# Patient Record
Sex: Female | Born: 1995
Health system: Southern US, Community
[De-identification: ages and names within clinical notes are randomized; demographics above are authoritative.]

## PROBLEM LIST (undated history)

## (undated) DIAGNOSIS — G43909 Migraine, unspecified, not intractable, without status migrainosus: Secondary | ICD-10-CM

## (undated) DIAGNOSIS — G510 Bell's palsy: Secondary | ICD-10-CM

## (undated) DIAGNOSIS — E119 Type 2 diabetes mellitus without complications: Secondary | ICD-10-CM

## (undated) DIAGNOSIS — I10 Essential (primary) hypertension: Secondary | ICD-10-CM

## (undated) HISTORY — DX: Bell's palsy: G51.0

## (undated) HISTORY — DX: Type 2 diabetes mellitus without complications: E11.9

## (undated) HISTORY — PX: WISDOM TOOTH EXTRACTION: SHX21

## (undated) HISTORY — DX: Essential (primary) hypertension: I10

---
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
## 2014-01-28 ENCOUNTER — Encounter (HOSPITAL_COMMUNITY): Payer: Self-pay | Admitting: Emergency Medicine

## 2014-01-28 ENCOUNTER — Emergency Department (HOSPITAL_COMMUNITY)
Admission: EM | Admit: 2014-01-28 | Discharge: 2014-01-28 | Disposition: A | Discharge status: Home / Self Care | Payer: BC Managed Care – PPO | Attending: Emergency Medicine | Admitting: Emergency Medicine

## 2014-01-28 DIAGNOSIS — G4459 Other complicated headache syndrome: Secondary | ICD-10-CM

## 2014-01-28 DIAGNOSIS — G510 Bell's palsy: Secondary | ICD-10-CM | POA: Insufficient documentation

## 2014-01-28 DIAGNOSIS — Z8679 Personal history of other diseases of the circulatory system: Secondary | ICD-10-CM | POA: Insufficient documentation

## 2014-01-28 DIAGNOSIS — Z793 Long term (current) use of hormonal contraceptives: Secondary | ICD-10-CM | POA: Diagnosis not present

## 2014-01-28 DIAGNOSIS — Z87891 Personal history of nicotine dependence: Secondary | ICD-10-CM | POA: Insufficient documentation

## 2014-01-28 DIAGNOSIS — R51 Headache: Secondary | ICD-10-CM | POA: Diagnosis present

## 2014-01-28 MED ORDER — PREDNISONE 20 MG PO TABS: 60.0000 mg | ORAL_TABLET | Freq: Every day | ORAL | Status: DC

## 2014-01-28 MED ORDER — DEXAMETHASONE SODIUM PHOSPHATE 10 MG/ML IJ SOLN
10.0000 mg | Freq: Once | INTRAMUSCULAR | Status: AC
  Administered 2014-01-28: 10 mg via INTRAVENOUS
  Filled 2014-01-28: qty 1

## 2014-01-28 MED ORDER — SODIUM CHLORIDE 0.9 % IV BOLUS (SEPSIS)
1000.0000 mL | Freq: Once | INTRAVENOUS | Status: AC
  Administered 2014-01-28: 1000 mL via INTRAVENOUS

## 2014-01-28 MED ORDER — KETOROLAC TROMETHAMINE 30 MG/ML IJ SOLN
60.0000 mg | Freq: Once | INTRAMUSCULAR | Status: DC
  Filled 2014-01-28: qty 2

## 2014-01-28 MED ORDER — KETOROLAC TROMETHAMINE 30 MG/ML IJ SOLN
30.0000 mg | Freq: Once | INTRAMUSCULAR | Status: AC
  Administered 2014-01-28: 30 mg via INTRAVENOUS

## 2014-01-28 MED ORDER — METOCLOPRAMIDE HCL 5 MG/ML IJ SOLN
10.0000 mg | Freq: Once | INTRAMUSCULAR | Status: AC
  Administered 2014-01-28: 10 mg via INTRAVENOUS
  Filled 2014-01-28: qty 2

## 2014-01-28 NOTE — ED Notes (Signed)
Pt reports right temporal headache and right facial droop. Pt reports hx of complicated migraine.

## 2014-01-28 NOTE — Discharge Instructions (Signed)
Bell's Palsy °Bell's palsy is a condition in which the muscles on one side of the face cannot move (paralysis). This is because the nerves in the face are paralyzed. It is most often thought to be caused by a virus. The virus causes swelling of the nerve that controls movement on one side of the face. The nerve travels through a tight space surrounded by bone. When the nerve swells, it can be compressed by the bone. This results in damage to the protective covering around the nerve. This damage interferes with how the nerve communicates with the muscles of the face. As a result, it can cause weakness or paralysis of the facial muscles.  °Injury (trauma), tumor, and surgery may cause Bell's palsy, but most of the time the cause is unknown. It is a relatively common condition. It starts suddenly (abrupt onset) with the paralysis usually ending within 2 days. Bell's palsy is not dangerous. But because the eye does not close properly, you may need care to keep the eye from getting dry. This can include splinting (to keep the eye shut) or moistening with artificial tears. Bell's palsy very seldom occurs on both sides of the face at the same time. °SYMPTOMS  °· Eyebrow sagging. °· Drooping of the eyelid and corner of the mouth. °· Inability to close one eye. °· Loss of taste on the front of the tongue. °· Sensitivity to loud noises. °TREATMENT  °The treatment is usually non-surgical. If the patient is seen within the first 24 to 48 hours, a short course of steroids may be prescribed, in an attempt to shorten the length of the condition. Antiviral medicines may also be used with the steroids, but it is unclear if they are helpful.  °You will need to protect your eye, if you cannot close it. The cornea (clear covering over your eye) will become dry and can be damaged. Artificial tears can be used to keep your eye moist. Glasses or an eye patch should be worn to protect your eye. °PROGNOSIS  °Recovery is variable, ranging  from days to months. Although the problem usually goes away completely (about 80% of cases resolve), predicting the outcome is impossible. Most people improve within 3 weeks of when the symptoms began. Improvement may continue for 3 to 6 months. A small number of people have moderate to severe weakness that is permanent.  °HOME CARE INSTRUCTIONS  °· If your caregiver prescribed medication to reduce swelling in the nerve, use as directed. Do not stop taking the medication unless directed by your caregiver. °· Use moisturizing eye drops as needed to prevent drying of your eye, as directed by your caregiver. °· Protect your eye, as directed by your caregiver. °· Use facial massage and exercises, as directed by your caregiver. °· Perform your normal activities, and get your normal rest. °SEEK IMMEDIATE MEDICAL CARE IF:  °· There is pain, redness or irritation in the eye. °· You or your child has an oral temperature above 102° F (38.9° C), not controlled by medicine. °MAKE SURE YOU:  °· Understand these instructions. °· Will watch your condition. °· Will get help right away if you are not doing well or get worse. °Document Released: 12/28/2004 Document Revised: 03/22/2011 Document Reviewed: 04/06/2013 °[employer]® Patient Information ©2015 [employer]. This information is not intended to replace advice given to you by your health care provider. Make sure you discuss any questions you have with your health care provider. ° °

## 2014-01-28 NOTE — ED Notes (Signed)
Pt states that around 2am this morning she started having the facial numbness.  Pt has facial droop to right side as well as right eye not closing symmetrically to left. Pt denies any weakness or deficits in hands or feet. Pt states that she has had this happen before but on her left side and was seen at hospital for it in 2014.

## 2014-01-28 NOTE — ED Provider Notes (Signed)
CSN: [id number]     Arrival date & time 01/28/14  1254 History   First MD Initiated Contact with Patient 01/28/14 1337     Chief Complaint  Patient presents with  . Facial Droop  . facial numbness   . Headache     (Consider location/radiation/quality/duration/timing/severity/associated sxs/prior Treatment) HPI Pt is a 19yo femlae with hx of complicated migraines presenting to ED with c/o right sided headache, associated with right sided facial numbness and droop.  Headache started about 2 days ago, waxing and waning, improves with ibuprofen.  Pain is 8/10 at this time.  Right sided facial numbness and droop started about 2AM this morning.  Denies numbness or weakness in arms or legs. No neck, back or chest pain. No n/v/d. No fever or chills. Pt reports hx of similar symptoms with headache in 2014.  Pt had entire left sided weakness at that time. She was advised to f/u with neurology but states due to symptoms resolving, she never followed up as recommended. Denies recent injury, illness or travel. No new medications.   History reviewed. No pertinent past medical history. Past Surgical History  Procedure Laterality Date  . Wisdom tooth extraction     No family history on file. History  Substance Use Topics  . Smoking status: Former Smoker    Quit date: 01/11/2014  . Smokeless tobacco: Not on file  . Alcohol Use: Yes     Comment: moderate   OB History    No data available     Review of Systems  HENT: Negative for congestion and sinus pressure.   Eyes: Positive for photophobia. Negative for pain, redness and visual disturbance.  Cardiovascular: Negative for chest pain and palpitations.  Gastrointestinal: Negative for nausea, vomiting, abdominal pain and diarrhea.  Musculoskeletal: Negative for myalgias, back pain, neck pain and neck stiffness.  Neurological: Positive for facial asymmetry (right sided facial droop) and headaches ( right sided). Negative for dizziness, tremors,  seizures, syncope, speech difficulty, weakness, light-headedness and numbness.  All other systems reviewed and are negative.     Allergies  Pollen extract  Home Medications   Prior to Admission medications   Medication Sig Start Date End Date Taking? Authorizing Provider  ibuprofen (ADVIL,MOTRIN) 200 MG tablet Take 1,200 mg by mouth every 6 (six) hours as needed for headache or moderate pain.   Yes Historical Provider, MD  levonorgestrel-ethinyl estradiol (AVIANE,ALESSE,LESSINA) 0.1-20 MG-MCG tablet Take 1 tablet by mouth daily.   Yes Historical Provider, MD  ibuprofen (ADVIL,MOTRIN) 600 MG tablet Take 1 tablet (600 mg total) by mouth every 6 (six) hours as needed for pain. Patient not taking: Reported on 01/28/2014 10/22/12   Derwood KaplanAnkit Nanavati, MD  predniSONE (DELTASONE) 20 MG tablet Take 3 tablets (60 mg total) by mouth daily with breakfast. 3 tabs daily for 5 days, 2 tabs daily for 3 days, 1 tab daily for 2 days (total 10 days) 01/28/14   Junius FinnerErin O'Malley, PA-C   BP 145/95 mmHg  Pulse 65  Temp(Src) 98.5 F (36.9 C) (Oral)  Resp 18  Ht 5\' 6"  (1.676 m)  Wt 235 lb (106.595 kg)  BMI 37.95 kg/m2  SpO2 97%  LMP 01/22/2014 Physical Exam  Constitutional: She is oriented to person, place, and time. She appears well-developed and well-nourished. No distress.  Pt sitting calmly on exam bed, NAD  HENT:  Head: Normocephalic and atraumatic.  Eyes: Conjunctivae and EOM are normal. Pupils are equal, round, and reactive to light. No scleral icterus.  Neck: Normal range  of motion. Neck supple.  No nuchal rigidity or meningeal signs.  Cardiovascular: Normal rate, regular rhythm and normal heart sounds.   Pulmonary/Chest: Effort normal and breath sounds normal. No respiratory distress. She has no wheezes. She has no rales. She exhibits no tenderness.  Abdominal: Soft. Bowel sounds are normal. She exhibits no distension and no mass. There is no tenderness. There is no rebound and no guarding.   Musculoskeletal: Normal range of motion.  Neurological: She is alert and oriented to person, place, and time. She has normal strength. A cranial nerve deficit is present. No sensory deficit. She displays a negative Romberg sign. Coordination and gait normal.  Right sided facial droop involving entire right side of face including eyebrow, cheek and lips.  Unable to raise right eyebrow. Asymmetric smile. Speech is fluent. FROM with 5/5 strength in UE & LE bilaterally. Normal gait.   Skin: Skin is warm and dry. She is not diaphoretic.  Nursing note and vitals reviewed.   ED Course  Procedures (including critical care time) Labs Review Labs Reviewed - No data to display  Imaging Review No results found.   EKG Interpretation None      MDM   Final diagnoses:  Complicated headache syndromes  Bell's palsy   Pt with hx of complicated headaches presenting to ED with right sided facial droop with headache. HA started 2 days ago, facial droop started this morning. No hx trauma. No extremity weakness. Pt appears well, non-toxic. Facial weakness c/w Bell's Palsy. Not concerned for [facility], CVA, or meningitis.   Previous medical records reviewed. Do not believe repeat imaging would be of benefit at this time. Migraine cocktail with IV fluids, decadron, reglan and toradol given. Pt states she feels much improved.  Pain improved from 8/10 to 3/10.  Mild improvement in right sided facial droop. Pt hemodynamically stable for discharge home. Advised to f/u with [facility]. Rx: prednisone taper for 10 days. Home care instructions provided. Pt verbalized understanding and agreement with tx plan.    Junius Finner, PA-C 01/28/14 1519  Raeford Razor, MD 01/28/14 [phone]

## 2014-02-07 ENCOUNTER — Encounter: Payer: Self-pay | Admitting: Neurology

## 2014-02-07 ENCOUNTER — Ambulatory Visit (INDEPENDENT_AMBULATORY_CARE_PROVIDER_SITE_OTHER): Payer: BC Managed Care – PPO | Admitting: Neurology

## 2014-02-07 VITALS — BP 138/88 | HR 78 | Ht 66.0 in | Wt 233.0 lb

## 2014-02-07 DIAGNOSIS — B029 Zoster without complications: Secondary | ICD-10-CM

## 2014-02-07 DIAGNOSIS — G510 Bell's palsy: Secondary | ICD-10-CM

## 2014-02-07 MED ORDER — ACYCLOVIR 400 MG PO TABS: 400.0000 mg | ORAL_TABLET | Freq: Every day | ORAL | Status: DC

## 2014-02-07 MED ORDER — PREDNISONE 10 MG PO TABS: ORAL_TABLET | ORAL | Status: DC

## 2014-02-07 MED ORDER — ARTIFICIAL TEARS OP OINT: TOPICAL_OINTMENT | Freq: Every evening | OPHTHALMIC | Status: DC | PRN

## 2014-02-07 NOTE — Progress Notes (Signed)
PATIENT: Stacy Wang DOB: 1995/04/08  HISTORICAL  Stacy Wang is a 19 years old right-handed African-American female, accompanied by her father, referred by emergency room for evaluation of right Bell's palsy  She had a history of migraine headaches, she suffered prolonged severe headaches in January 25 2014, at evening of January 28 2014, she noticed sharp pain behind her right ear, which is different from her regular migraine, she also noticed difficulty moving her right mouth, difficulty closing her right eye, she could not taste her food at the right side of her tongue, woke up with worsening symptoms next morning,  Presented to the emergency room, was diagnosed with right Bell's palsy, was given the prescription of prednisone, tapering for 10 days,   There is mild improvement of her right facial weakness, continue have intermittent sharp pain at right ear, also noticed rashes, vesicle broken out at her right ear, no hearing loss, no visual loss, she does has a history of chickenpox as a child.   REVIEW OF SYSTEMS: Full 14 system review of systems performed and notable only for fever, chill, chest pain, hearing loss, ringing ears, blurry vision, eye pain, snoring, increased thirst, achy muscles, skin sensitivity, passing out, headaches, numbness, depression, insomnia, sleepiness,  ALLERGIES: Allergies  Allergen Reactions  . Pollen Extract Itching    HOME MEDICATIONS: Current Outpatient Prescriptions  Medication Sig Dispense Refill  . levonorgestrel-ethinyl estradiol (AVIANE,ALESSE,LESSINA) 0.1-20 MG-MCG tablet Take 1 tablet by mouth daily.     No current facility-administered medications for this visit.    PAST MEDICAL HISTORY: Past Medical History  Diagnosis Date  . Hypertension     PAST SURGICAL HISTORY: Past Surgical History  Procedure Laterality Date  . Wisdom tooth extraction      FAMILY HISTORY: Family History  Problem Relation Age of Onset  .  Depression Mother   . Diabetes Father   . Heart disease Father     SOCIAL HISTORY:  History   Social History  . Marital Status: Single    Spouse Name: N/A    Number of Children: 0  . Years of Education: Some colle   Occupational History  . [employer] - customer service    Social History Main Topics  . Smoking status: Former Smoker    Quit date: 01/11/2014  . Smokeless tobacco: Not on file  . Alcohol Use: 0.0 oz/week    0 Not specified per week     Comment: moderate  . Drug Use: No  . Sexual Activity: Not on file   Other Topics Concern  . Not on file   Social History Narrative   Right handed.   Lives at home with roommates.   Caffeine occasionally - not daily.     PHYSICAL EXAM   Filed Vitals:   02/07/14 1628  BP: 138/88  Pulse: 78  Height: 5\' 6"  (1.676 m)  Weight: 233 lb (105.688 kg)    Not recorded      Body mass index is 37.63 kg/(m^2).   Generalized: In no acute distress  Neck: Supple, no carotid bruits   Cardiac: Regular rate rhythm  Pulmonary: Clear to auscultation bilaterally  Musculoskeletal: No deformity  Neurological examination  Mentation: Alert oriented to time, place, history taking, and causual conversation,  Cranial nerve II-XII: Pupils were equal round reactive to light. Extraocular movements were full.  Visual field were full on confrontational test. Bilateral fundi were sharp.  Facial sensation were normal.  she has right upper, lower face weakness, was not  able to close her right eye tightly, has no right frontalis movement, profound right lower face weakness Hearing was intact to finger rubbing bilaterally. Uvula tongue midline.  Head turning and shoulder shrug and were normal and symmetric.Tongue protrusion into cheek strength was normal.External auditory canal was clear, she has red vesicles at her right ear   Motor: Normal tone, bulk and strength.  Sensory: Intact to fine touch, pinprick, preserved vibratory sensation, and  proprioception at toes.  Coordination: Normal finger to nose, heel-to-shin bilaterally there was no truncal ataxia  Gait: Rising up from seated position without assistance, normal stance, without trunk ataxia, moderate stride, good arm swing, smooth turning, able to perform tiptoe, and heel walking without difficulty.   Romberg signs: Negative  Deep tendon reflexes: Brachioradialis 2/2, biceps 2/2, triceps 2/2, patellar 2/2, Achilles 2/2, plantar responses were flexor bilaterally.   DIAGNOSTIC DATA (LABS, IMAGING, TESTING) - I reviewed patient records, labs, notes, testing and imaging myself where available.  Lab Results  Component Value Date   WBC 9.7 10/22/2012   HGB 11.3* 10/22/2012   HCT 34.0* 10/22/2012   MCV 71.3* 10/22/2012   PLT 405* 10/22/2012      Component Value Date/Time   NA 136 10/22/2012 0208   K 3.4* 10/22/2012 0208   CL 100 10/22/2012 0208   CO2 22 10/22/2012 0208   GLUCOSE 94 10/22/2012 0208   BUN 9 10/22/2012 0208   CREATININE 0.69 10/22/2012 0208   CALCIUM 9.8 10/22/2012 0208   GFRNONAA >90 10/22/2012 0208   GFRAA >90 10/22/2012 0208    ASSESSMENT AND PLAN  Ahria Schommer is a 19 y.o. female with right ear pain, vesicles broken out, consistent with right shingles, right Bell's palsy,  1, give her another course of steroids,  2, acyclovir 400 mg 5 times daily   3. Return to clinic in 2-3 weeks  Orders Placed This Encounter  Procedures  . CBC With Differential  . Comprehensive metabolic panel    Return in about 2 weeks (around 02/21/2014). Levert Feinstein, M.D. Ph.D.  [facility] [address] [place], [place] [address] [phone]

## 2014-02-08 ENCOUNTER — Telehealth: Payer: Self-pay | Admitting: Neurology

## 2014-02-08 LAB — COMPREHENSIVE METABOLIC PANEL
A/G RATIO: 1.9 (ref 1.1–2.5)
ALT: 12 IU/L (ref 0–32)
AST: 10 IU/L (ref 0–40)
Albumin: 4.3 g/dL (ref 3.5–5.5)
Alkaline Phosphatase: 73 IU/L (ref 39–117)
BUN/Creatinine Ratio: 13 (ref 8–20)
BUN: 9 mg/dL (ref 6–20)
CO2: 25 mmol/L (ref 18–29)
CREATININE: 0.71 mg/dL (ref 0.57–1.00)
Calcium: 9.6 mg/dL (ref 8.7–10.2)
Chloride: 98 mmol/L (ref 97–108)
GFR calc Af Amer: 142 mL/min/{1.73_m2} (ref >59)
GFR, EST NON AFRICAN AMERICAN: 123 mL/min/{1.73_m2} (ref >59)
Globulin, Total: 2.3 g/dL (ref 1.5–4.5)
Glucose: 100 mg/dL — ABNORMAL HIGH (ref 65–99)
POTASSIUM: 4 mmol/L (ref 3.5–5.2)
Sodium: 138 mmol/L (ref 134–144)
Total Protein: 6.6 g/dL (ref 6.0–8.5)

## 2014-02-08 LAB — CBC WITH DIFFERENTIAL
Basophils Absolute: 0 10*3/uL (ref 0.0–0.2)
Basos: 0 %
Eos: 0 %
Eosinophils Absolute: 0.1 10*3/uL (ref 0.0–0.4)
HCT: 37.3 % (ref 34.0–46.6)
Hemoglobin: 12.3 g/dL (ref 11.1–15.9)
IMMATURE GRANULOCYTES: 0 %
Immature Grans (Abs): 0 10*3/uL (ref 0.0–0.1)
LYMPHS ABS: 6.5 10*3/uL — AB (ref 0.7–3.1)
LYMPHS: 40 %
MCH: 26.6 pg (ref 26.6–33.0)
MCHC: 33 g/dL (ref 31.5–35.7)
MCV: 81 fL (ref 79–97)
MONOS ABS: 1.4 10*3/uL — AB (ref 0.1–0.9)
Monocytes: 9 %
NEUTROS PCT: 51 %
Neutrophils Absolute: 8.3 10*3/uL — ABNORMAL HIGH (ref 1.4–7.0)
RBC: 4.62 x10E6/uL (ref 3.77–5.28)
RDW: 14.8 % (ref 12.3–15.4)
WBC: 16.3 10*3/uL — ABNORMAL HIGH (ref 3.4–10.8)

## 2014-02-08 NOTE — Telephone Encounter (Signed)
Michelle: Please call patient, laboratory showed elevated WBCs, could due to her most recent steroid use, check to see if she has any signs of infection, such as coughing, fever, UTI symptoms. If not, continue medication as prescribed

## 2014-02-08 NOTE — Telephone Encounter (Signed)
Left message for patient to return my call.

## 2014-02-11 NOTE — Telephone Encounter (Signed)
Patient aware of results.  She has not had any symptoms suggesting an infection.  She will continue her medications as prescribed.

## 2014-02-11 NOTE — Telephone Encounter (Signed)
Left message on voicemail.

## 2014-02-21 ENCOUNTER — Encounter: Payer: Self-pay | Admitting: Neurology

## 2014-02-21 ENCOUNTER — Ambulatory Visit (INDEPENDENT_AMBULATORY_CARE_PROVIDER_SITE_OTHER): Payer: BC Managed Care – PPO | Admitting: Neurology

## 2014-02-21 VITALS — BP 149/100 | HR 98 | Ht 66.0 in | Wt 232.0 lb

## 2014-02-21 DIAGNOSIS — G510 Bell's palsy: Secondary | ICD-10-CM

## 2014-02-21 DIAGNOSIS — B029 Zoster without complications: Secondary | ICD-10-CM

## 2014-02-21 MED ORDER — GABAPENTIN 300 MG PO CAPS: 300.0000 mg | ORAL_CAPSULE | Freq: Three times a day (TID) | ORAL | Status: DC

## 2014-02-21 NOTE — Progress Notes (Signed)
PATIENT: Stacy Wang DOB: 09-24-1995  HISTORICAL (initial visit Jan 28th 2016)  Stacy Wang is a 19 years old right-handed African-American female, accompanied by her father, referred by emergency room for evaluation of right Bell's palsy  She had a history of migraine headaches, she suffered prolonged severe headaches in January 25 2014, at evening of January 28 2014, she noticed sharp pain behind her right ear, which is different from her regular migraine, she also noticed difficulty moving her right mouth, difficulty closing her right eye, she could not taste her food at the right side of her tongue, woke up with worsening symptoms next morning,  Presented to the emergency room, was diagnosed with right Bell's palsy, was given the prescription of prednisone, tapering for 10 days,   There is mild improvement of her right facial weakness, continue have intermittent sharp pain at right ear, also noticed rashes, vesicle broken out at her right ear, no hearing loss, no visual loss, she does has a history of chickenpox as a child.  UPDATE FEb 11th 2016: She has mild right cheek pain when pressed on it, she has no significant dry eye, she can close her right eye now, but still has significant right facial weakness, right ear rash has gone.    REVIEW OF SYSTEMS: Full 14 system review of systems performed and notable only for   ALLERGIES: Allergies  Allergen Reactions  . Pollen Extract Itching    HOME MEDICATIONS: Current Outpatient Prescriptions  Medication Sig Dispense Refill  . artificial tears (LACRILUBE) OINT ophthalmic ointment Place into the right eye at bedtime as needed for dry eyes. 1 Tube 3  . levonorgestrel-ethinyl estradiol (AVIANE,ALESSE,LESSINA) 0.1-20 MG-MCG tablet Take 1 tablet by mouth daily.     No current facility-administered medications for this visit.    PAST MEDICAL HISTORY: Past Medical History  Diagnosis Date  . Hypertension     PAST SURGICAL  HISTORY: Past Surgical History  Procedure Laterality Date  . Wisdom tooth extraction      FAMILY HISTORY: Family History  Problem Relation Age of Onset  . Depression Mother   . Diabetes Father   . Heart disease Father     SOCIAL HISTORY:  History   Social History  . Marital Status: Single    Spouse Name: N/A  . Number of Children: 0  . Years of Education: Some colle   Occupational History  . [employer] - customer service    Social History Main Topics  . Smoking status: Former Smoker    Quit date: 01/11/2014  . Smokeless tobacco: Not on file  . Alcohol Use: 0.0 oz/week    0 Standard drinks or equivalent per week     Comment: moderate  . Drug Use: No  . Sexual Activity: Not on file   Other Topics Concern  . Not on file   Social History Narrative   Right handed.   Lives at home with roommates.   Caffeine occasionally - not daily.     PHYSICAL EXAM   Filed Vitals:   02/21/14 1517  BP: 149/100  Pulse: 98  Height:  (1.676 m)  Weight: 232 lb (105.235 kg)    Not recorded      Body mass index is 37.46 kg/(m^2).   Generalized: In no acute distress  Neck: Supple, no carotid bruits   Cardiac: Regular rate rhythm  Pulmonary: Clear to auscultation bilaterally  Musculoskeletal: No deformity  Neurological examination  Mentation: Alert oriented to time, place, history taking,  and causual conversation,  Cranial nerve II-XII: Pupils were equal round reactive to light. Extraocular movements were full.  Visual field were full on confrontational test. Bilateral fundi were sharp.  Facial sensation were normal.  she has right upper, lower face weakness, is able to close her right eye, no cornea was exposed, has no right frontalis movement, profound right lower face weakness Hearing was intact to finger rubbing bilaterally. Uvula tongue midline.  Head turning and shoulder shrug and were normal and symmetric.Tongue protrusion into cheek strength was  normal.External auditory canal was clear  Motor: Normal tone, bulk and strength.  Sensory: Intact to fine touch, pinprick, preserved vibratory sensation, and proprioception at toes.  Coordination: Normal finger to nose, heel-to-shin bilaterally there was no truncal ataxia  Gait: Rising up from seated position without assistance, normal stance, without trunk ataxia, moderate stride, good arm swing, smooth turning, able to perform tiptoe, and heel walking without difficulty.   Romberg signs: Negative  Deep tendon reflexes: Brachioradialis 2/2, biceps 2/2, triceps 2/2, patellar 2/2, Achilles 2/2, plantar responses were flexor bilaterally.   DIAGNOSTIC DATA (LABS, IMAGING, TESTING) - I reviewed patient records, labs, notes, testing and imaging myself where available.  Lab Results  Component Value Date   WBC 16.3* 02/07/2014   HGB 12.3 02/07/2014   HCT 37.3 02/07/2014   MCV 81 02/07/2014   PLT 405* 10/22/2012      Component Value Date/Time   NA 138 02/07/2014 1658   NA 136 10/22/2012 0208   K 4.0 02/07/2014 1658   CL 98 02/07/2014 1658   CO2 25 02/07/2014 1658   GLUCOSE 100* 02/07/2014 1658   GLUCOSE 94 10/22/2012 0208   BUN 9 02/07/2014 1658   BUN 9 10/22/2012 0208   CREATININE 0.71 02/07/2014 1658   CALCIUM 9.6 02/07/2014 1658   PROT 6.6 02/07/2014 1658   AST 10 02/07/2014 1658   ALT 12 02/07/2014 1658   ALKPHOS 73 02/07/2014 1658   BILITOT <0.2 02/07/2014 1658   GFRNONAA 123 02/07/2014 1658   GFRAA 142 02/07/2014 1658    ASSESSMENT AND PLAN  Stacy Wang is a 19 y.o. female with right ear pain, vesicles broken out, consistent with right shingles, right Bell's palsy, since January 28 2014, was treated with 2 rounds of tapering dose of steroid, acyclovir, continue have significant right facial weakness, also complains of frequent right-sided headaches.  1, gabapentin 300 mg 3 times a day as headache prevention 2. NSAIDs when necessary 3. Return to clinic in 3  months  Stacy Wang, M.D. Ph.D.  [facility] [address], [address] [place], [place] [address] [phone]

## 2014-03-14 ENCOUNTER — Ambulatory Visit: Payer: BC Managed Care – PPO | Admitting: Neurology

## 2014-05-23 ENCOUNTER — Ambulatory Visit: Payer: BC Managed Care – PPO | Admitting: Neurology

## 2014-06-17 ENCOUNTER — Ambulatory Visit (INDEPENDENT_AMBULATORY_CARE_PROVIDER_SITE_OTHER): Payer: BC Managed Care – PPO | Admitting: Neurology

## 2014-06-17 ENCOUNTER — Encounter: Payer: Self-pay | Admitting: Neurology

## 2014-06-17 VITALS — BP 148/102 | HR 70 | Ht 66.0 in | Wt 238.0 lb

## 2014-06-17 DIAGNOSIS — B029 Zoster without complications: Secondary | ICD-10-CM | POA: Diagnosis not present

## 2014-06-17 DIAGNOSIS — G510 Bell's palsy: Secondary | ICD-10-CM | POA: Diagnosis not present

## 2014-06-17 MED ORDER — GABAPENTIN 300 MG PO CAPS
300.0000 mg | ORAL_CAPSULE | Freq: Three times a day (TID) | ORAL | Status: DC
Start: 2014-06-17 — End: 2014-12-19

## 2014-06-17 NOTE — Progress Notes (Signed)
Chief Complaint  Patient presents with  . Bell's Palsy    Says she started to experience facial swelling,drooping and pain again approximately four weeks ago.  She has not taken gabapentin in more than two months.  . Herpes Zoster    Denies any rash but she is itching behind bilateral ears.      PATIENT: Stacy Wang DOB: [birth year]  HISTORICAL (initial visit Jan 28th 2016)  Stacy Wang is a 19 years old right-handed African-American female, accompanied by her father, referred by emergency room for evaluation of right Bell's palsy  She had a history of migraine headaches, she suffered prolonged severe headaches in January 25 2014, at evening of January 28 2014, she noticed sharp pain behind her right ear, which is different from her regular migraine, she also noticed difficulty moving her right mouth, difficulty closing her right eye, she could not taste her food at the right side of her tongue, woke up with worsening symptoms next morning,  Presented to the emergency room, was diagnosed with right Bell's palsy, was given the prescription of prednisone, tapering for 10 days,   There is mild improvement of her right facial weakness, continue have intermittent sharp pain at right ear, also noticed rashes, vesicle broken out at her right ear, no hearing loss, no visual loss, she does has a history of chickenpox as a child.  UPDATE FEb 11th 2016: She has mild right cheek pain when pressed on it, she has no significant dry eye, she can close her right eye now, but still has significant right facial weakness, right ear rash has gone.   UPDATE June 6th 2016:  She is overall better, her right facial weakness has much improved, she can close her eye better, but she noticed frequent right lower face pulling, twitching sensation, she has mild right side blurry vision,   She has frequent headaches  REVIEW OF SYSTEMS: Full 14 system review of systems performed and notable only for    ALLERGIES: Allergies  Allergen Reactions  . Pollen Extract Itching    HOME MEDICATIONS: Current Outpatient Prescriptions  Medication Sig Dispense Refill  . artificial tears (LACRILUBE) OINT ophthalmic ointment Place into the right eye at bedtime as needed for dry eyes. 1 Tube 3  . gabapentin (NEURONTIN) 300 MG capsule Take 1 capsule (300 mg total) by mouth 3 (three) times daily. 90 capsule 11  . levonorgestrel-ethinyl estradiol (AVIANE,ALESSE,LESSINA) 0.1-20 MG-MCG tablet Take 1 tablet by mouth daily.     No current facility-administered medications for this visit.    PAST MEDICAL HISTORY: Past Medical History  Diagnosis Date  . Hypertension     PAST SURGICAL HISTORY: Past Surgical History  Procedure Laterality Date  . Wisdom tooth extraction      FAMILY HISTORY: Family History  Problem Relation Age of Onset  . Depression Mother   . Diabetes Father   . Heart disease Father     SOCIAL HISTORY:  History   Social History  . Marital Status: Single    Spouse Name: N/A  . Number of Children: 0  . Years of Education: Some colle   Occupational History  . [employer] - customer service    Social History Main Topics  . Smoking status: Former Smoker    Quit date: 01/11/2014  . Smokeless tobacco: Not on file  . Alcohol Use: 0.0 oz/week    0 Standard drinks or equivalent per week     Comment: moderate  . Drug Use: No  . Sexual Activity:  Not on file   Other Topics Concern  . Not on file   Social History Narrative   Right handed.   Lives at home with roommates.   Caffeine occasionally - not daily.     PHYSICAL EXAM   Filed Vitals:   06/17/14 1324  BP: 148/102  Pulse: 70  Height:  (1.676 m)  Weight: 238 lb (107.956 kg)    Not recorded      Body mass index is 38.43 kg/(m^2).  PHYSICAL EXAMNIATION:  Gen: NAD, conversant, well nourised, obese, well groomed                     Cardiovascular: Regular rate rhythm, no peripheral edema, warm,  nontender. Eyes: Conjunctivae clear without exudates or hemorrhage Neck: Supple, no carotid bruise. Pulmonary: Clear to auscultation bilaterally   NEUROLOGICAL EXAM:  MENTAL STATUS: Speech:    Speech is normal; fluent and spontaneous with normal comprehension.  Cognition:    The patient is oriented to person, place, and time;     recent and remote memory intact;     language fluent;     normal attention, concentration,     fund of knowledge.  CRANIAL NERVES: CN II: Visual fields are full to confrontation. Fundoscopic exam is normal with sharp discs and no vascular changes. Venous pulsations are present bilaterally. Pupils are 4 mm and briskly reactive to light. Visual acuity is 20/20 bilaterally. CN III, IV, VI: extraocular movement are normal. No ptosis. CN V: Facial sensation is intact to pinprick in all 3 divisions bilaterally. Corneal responses are intact.  CN VII: She has mild right eye closure, right cheek puff weakness, she has frequent right facial twitching. CN VIII: Hearing is normal to rubbing fingers CN IX, X: Palate elevates symmetrically. Phonation is normal. CN XI: Head turning and shoulder shrug are intact CN XII: Tongue is midline with normal movements and no atrophy.  MOTOR: There is no pronator drift of out-stretched arms. Muscle bulk and tone are normal. Muscle strength is normal.  REFLEXES: Reflexes are 2+ and symmetric at the biceps, triceps, knees, and ankles. Plantar responses are flexor.  SENSORY: Light touch, pinprick, position sense, and vibration sense are intact in fingers and toes.  COORDINATION: Rapid alternating movements and fine finger movements are intact. There is no dysmetria on finger-to-nose and heel-knee-shin. There are no abnormal or extraneous movements.   GAIT/STANCE: Posture is normal. Gait is steady with normal steps, base, arm swing, and turning. Heel and toe walking are normal. Tandem gait is normal.  Romberg is  absent.   DIAGNOSTIC DATA (LABS, IMAGING, TESTING) - I reviewed patient records, labs, notes, testing and imaging myself where available.  Lab Results  Component Value Date   WBC 16.3* 02/07/2014   HGB 12.3 02/07/2014   HCT 37.3 02/07/2014   MCV 81 02/07/2014   PLT 405* 10/22/2012      Component Value Date/Time   NA 138 02/07/2014 1658   NA 136 10/22/2012 0208   K 4.0 02/07/2014 1658   CL 98 02/07/2014 1658   CO2 25 02/07/2014 1658   GLUCOSE 100* 02/07/2014 1658   GLUCOSE 94 10/22/2012 0208   BUN 9 02/07/2014 1658   BUN 9 10/22/2012 0208   CREATININE 0.71 02/07/2014 1658   CALCIUM 9.6 02/07/2014 1658   PROT 6.6 02/07/2014 1658   AST 10 02/07/2014 1658   ALT 12 02/07/2014 1658   ALKPHOS 73 02/07/2014 1658   BILITOT <0.2 02/07/2014 1658  [facility] 02/07/2014 1658   GFRAA 142 02/07/2014 1658    ASSESSMENT AND PLAN  Kiva Dareen Pianonderson is a 19 y.o. female with right Bell's palsy since January 28 2014, with painful rash broke out, consistent with right shingles, involving right seventh nerve, she was treated with 2 rounds of tapering dose of steroid, acyclovir,  Overall, her right facial weakness has much improved, occasionally right-sided headaches, right facial discomfort,  Refill gabapentin  RTC In 6 months  Levert FeinsteinYijun Covey Baller, M.D. Ph.D.  [facility] [address], [address] [place], [place] [address] [phone]

## 2014-07-04 IMAGING — CT CT HEAD W/O CM
2 series · 15 of 30 positions shown, 19 images · non-contrast
Comparison: None.

CLINICAL DATA: Left-sided numbness and left sided headache.

CT HEAD WITHOUT CONTRAST
TECHNIQUE: Contiguous axial images were obtained from the base of
the skull through the vertex without contrast.

[Series 2: head w/o · axial · non-contrast · 0.40mm/px · z∈[+283,+413]mm · 13 of 32 slices shown, 17 images]
[im 3/32  brain]
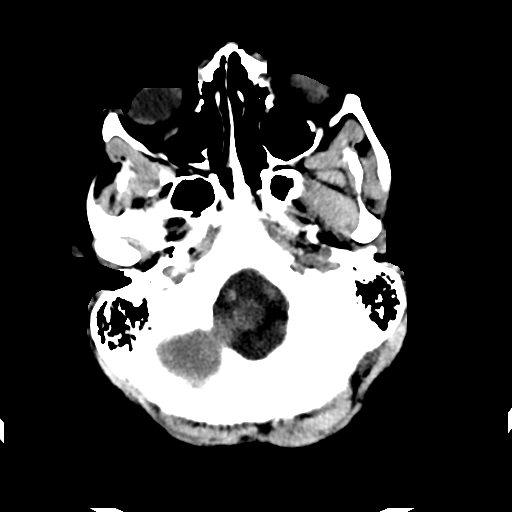
[im 3/32  bone]
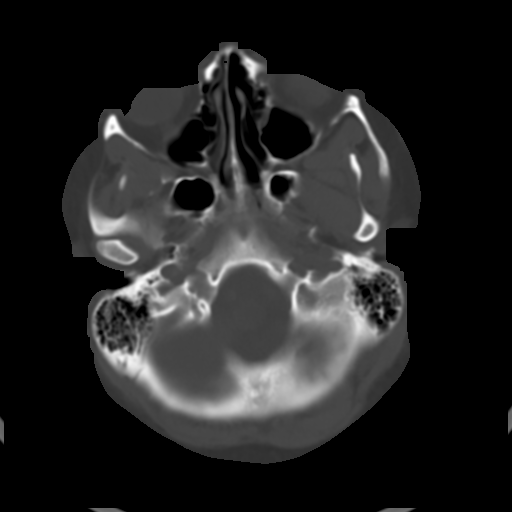
[im 5/32  brain]
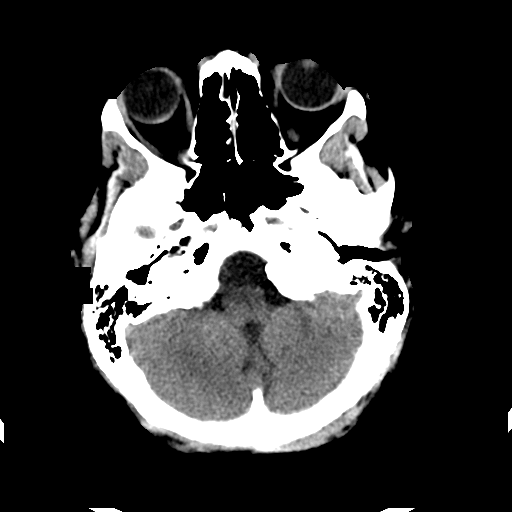
[im 7/32  brain]
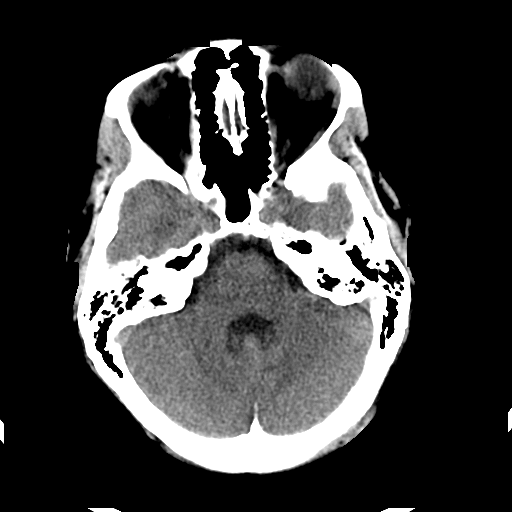
[im 9/32  brain]
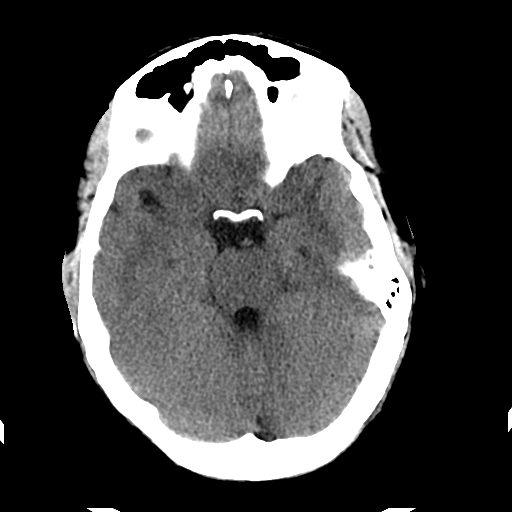
[im 12/32  brain]
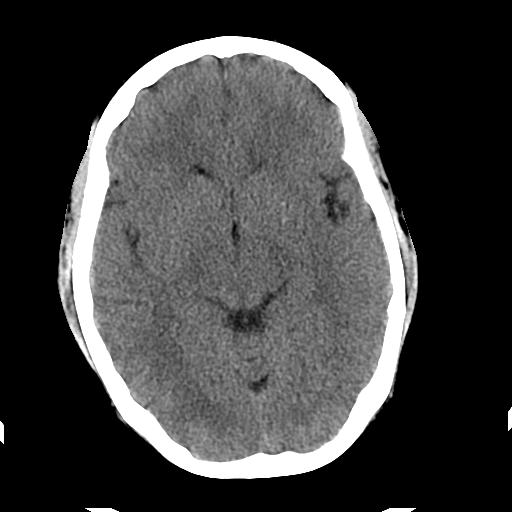
[im 12/32  bone]
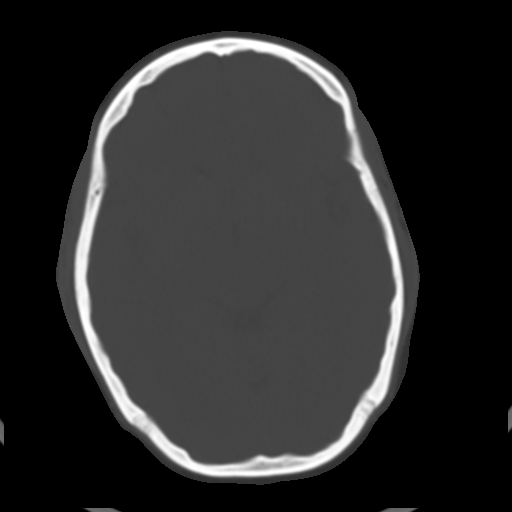
[im 14/32  brain]
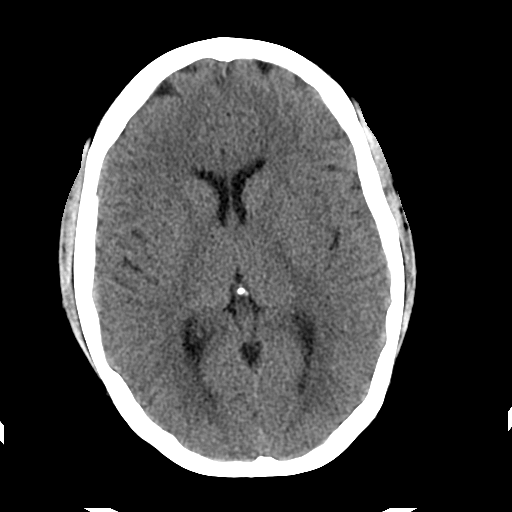
[im 16/32  brain]
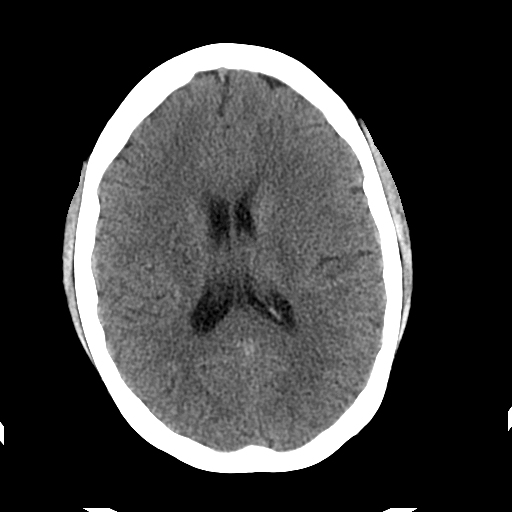
[im 18/32  brain]
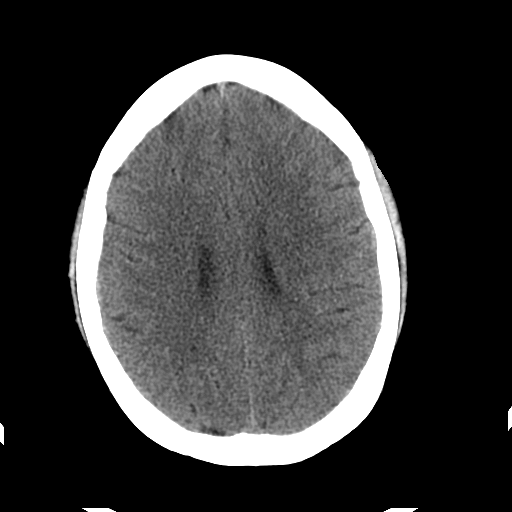
[im 20/32  brain]
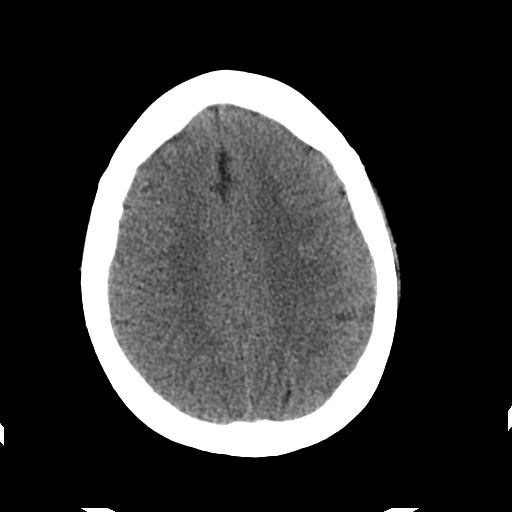
[im 20/32  bone]
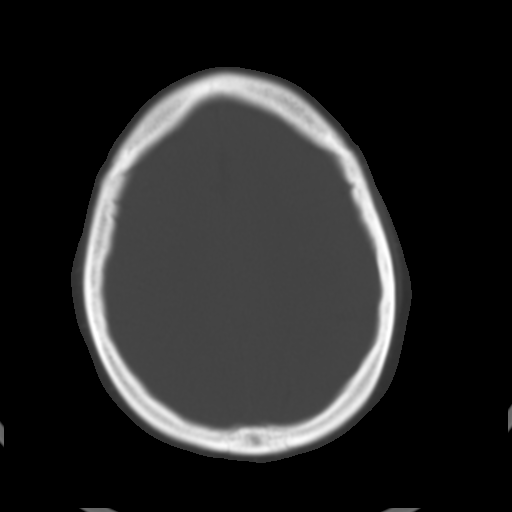
[im 23/32  brain]
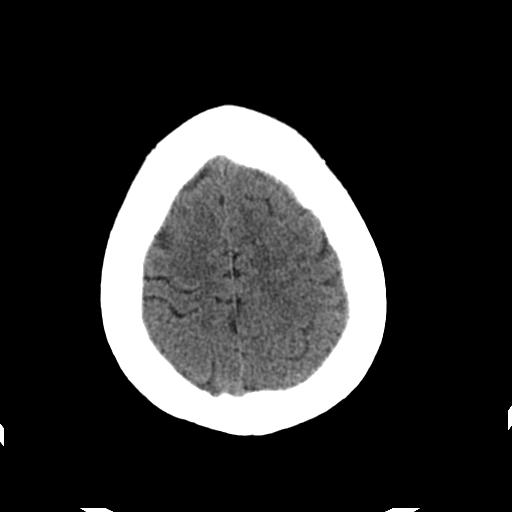
[im 25/32  brain]
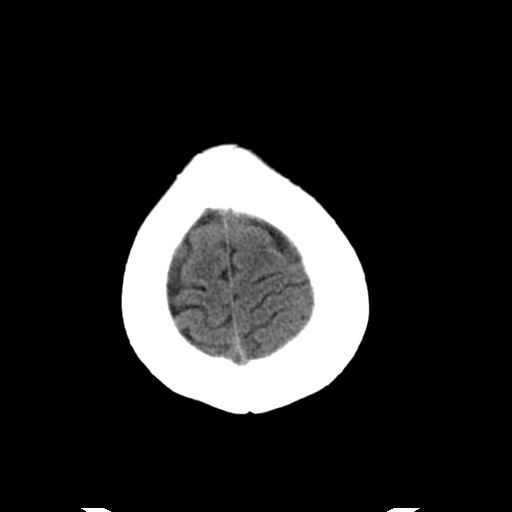
[im 27/32  brain]
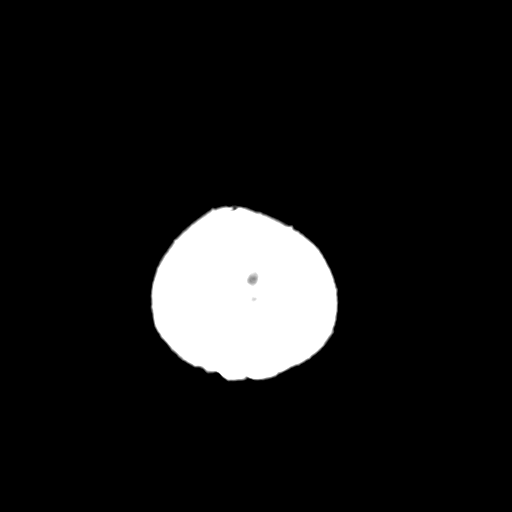
[im 29/32  brain]
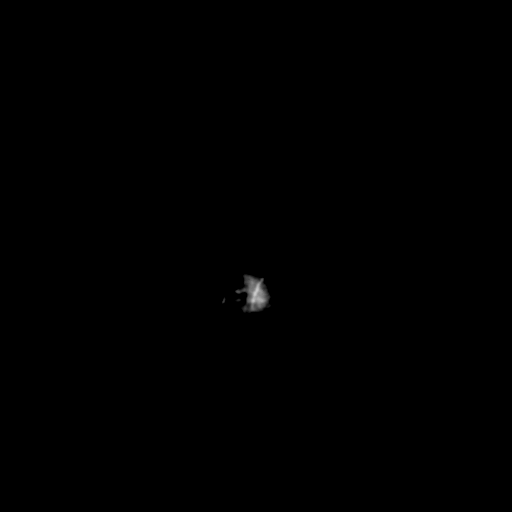
[im 29/32  bone]
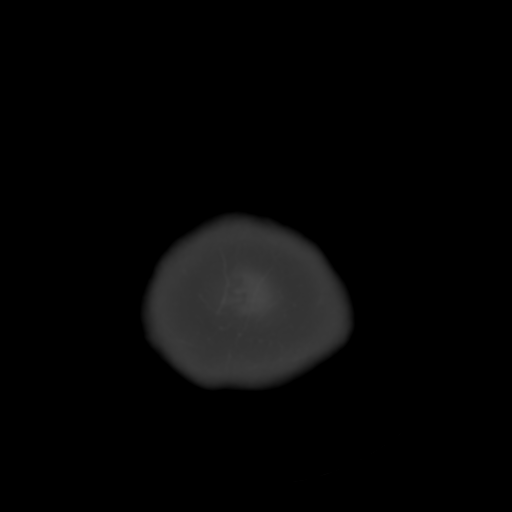

[Series 3: head w/o bone · axial · non-contrast · 0.40mm/px · z∈[+283,+303]mm · 2 of 32 slices shown]
[im 3/32  bone]
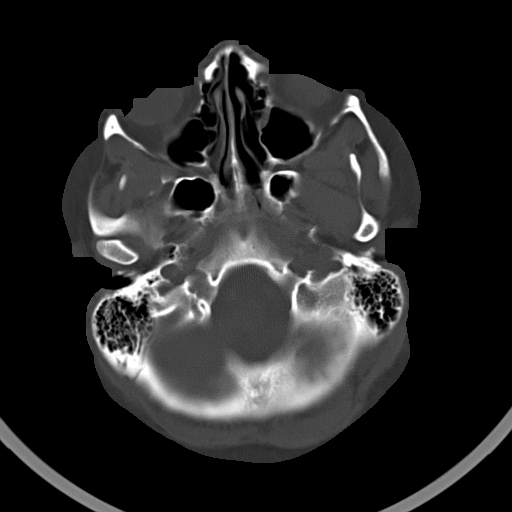
[im 7/32  bone]
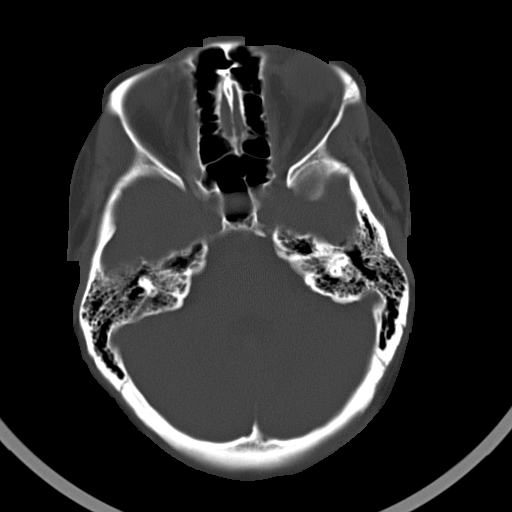

[15 of 30 positions shown; findings below may reference images not displayed]

FINDINGS: There is no evidence of acute infarction, mass lesion, or
intra- or extra-axial hemorrhage on CT.

The posterior fossa, including the cerebellum, brainstem and fourth
ventricle, is within normal limits.  The third and lateral
ventricles, and basal ganglia are unremarkable in appearance.  The
cerebral hemispheres are symmetric in appearance, with normal gray-
white differentiation.  No mass effect or midline shift is seen.

There is no evidence of fracture; visualized osseous structures are
unremarkable in appearance.  The orbits are within normal limits.
The paranasal sinuses and mastoid air cells are well-aerated.  No
significant soft tissue abnormalities are seen.
IMPRESSION: Unremarkable noncontrast CT of the head.

## 2014-12-18 ENCOUNTER — Telehealth: Payer: Self-pay

## 2014-12-18 NOTE — Telephone Encounter (Signed)
Called patient to offer earlier appt time on 12/19/14. No answer.

## 2014-12-19 ENCOUNTER — Ambulatory Visit (INDEPENDENT_AMBULATORY_CARE_PROVIDER_SITE_OTHER): Payer: BC Managed Care – PPO | Admitting: Adult Health

## 2014-12-19 ENCOUNTER — Encounter: Payer: Self-pay | Admitting: Adult Health

## 2014-12-19 ENCOUNTER — Ambulatory Visit: Payer: Self-pay | Admitting: Neurology

## 2014-12-19 ENCOUNTER — Ambulatory Visit: Payer: BC Managed Care – PPO | Admitting: Neurology

## 2014-12-19 ENCOUNTER — Telehealth: Payer: Self-pay | Admitting: Adult Health

## 2014-12-19 VITALS — BP 128/74 | HR 88 | Resp 20 | Ht 65.0 in | Wt 239.0 lb

## 2014-12-19 DIAGNOSIS — H9191 Unspecified hearing loss, right ear: Secondary | ICD-10-CM | POA: Diagnosis not present

## 2014-12-19 DIAGNOSIS — B029 Zoster without complications: Secondary | ICD-10-CM

## 2014-12-19 DIAGNOSIS — G510 Bell's palsy: Secondary | ICD-10-CM | POA: Diagnosis not present

## 2014-12-19 MED ORDER — GABAPENTIN 300 MG PO CAPS: 300.0000 mg | ORAL_CAPSULE | Freq: Three times a day (TID) | ORAL | Status: DC

## 2014-12-19 NOTE — Progress Notes (Signed)
PATIENT: Stacy Wang DOB: 10/21/1995  REASON FOR VISIT: follow up- Bell's palsy, shingles HISTORY FROM: patient  HISTORY OF PRESENT ILLNESS: Ms. Stacy Wang is a 19 year old female with a history of Bell's palsy shingles. She returns today for follow-up. The patient reports that her facial weakness has improved. She continues to have some issues with eye closure on the right. The patient reports that she is no longer taking gabapentin. She started about 2 months ago after her prescription expired. She states that she is not having any discomfort in the right side of the face. She does state that since stopping gabapentin she's been having intermittent hearing loss on the right side. She states that her hearing will decrease to a whisper and then spontaneously return to her baseline. She states that these episodes happen 2-3 times in the last 2 months. She states that she did have some changes with her initial symptoms. She states that approximately 2 months ago she did have what she thought was the flu. Since then she has not been sick. She denies any new neurological symptoms. She returns today for an evaluation.  HISTORY Stacy Arabia(YAN): Stacy Ceollyah Wong is a 19 years old right-handed African-American female, accompanied by her father, referred by emergency room for evaluation of right Bell's palsy  She had a history of migraine headaches, she suffered prolonged severe headaches in January 25 2014, at evening of January 28 2014, she noticed sharp pain behind her right ear, which is different from her regular migraine, she also noticed difficulty moving her right mouth, difficulty closing her right eye, she could not taste her food at the right side of her tongue, woke up with worsening symptoms next morning,  Presented to the emergency room, was diagnosed with right Bell's palsy, was given the prescription of prednisone, tapering for 10 days,   There is mild improvement of her right facial weakness,  continue have intermittent sharp pain at right ear, also noticed rashes, vesicle broken out at her right ear, no hearing loss, no visual loss, she does has a history of chickenpox as a child.  UPDATE FEb 11th 2016: She has mild right cheek pain when pressed on it, she has no significant dry eye, she can close her right eye now, but still has significant right facial weakness, right ear rash has gone.   UPDATE June 6th 2016:  She is overall better, her right facial weakness has much improved, she can close her eye better, but she noticed frequent right lower face pulling, twitching sensation, she has mild right side blurry vision,   She has frequent headaches  REVIEW OF SYSTEMS: Out of a complete 14 system review of symptoms, the patient complains only of the following symptoms, and all other reviewed systems are negative.  ALLERGIES: Allergies  Allergen Reactions  . Pollen Extract Itching    HOME MEDICATIONS: Outpatient Prescriptions Prior to Visit  Medication Sig Dispense Refill  . artificial tears (LACRILUBE) OINT ophthalmic ointment Place into the right eye at bedtime as needed for dry eyes. 1 Tube 3  . levonorgestrel-ethinyl estradiol (AVIANE,ALESSE,LESSINA) 0.1-20 MG-MCG tablet Take 1 tablet by mouth daily.    Marland Kitchen. gabapentin (NEURONTIN) 300 MG capsule Take 1 capsule (300 mg total) by mouth 3 (three) times daily. 90 capsule 11   No facility-administered medications prior to visit.    PAST MEDICAL HISTORY: Past Medical History  Diagnosis Date  . Hypertension   . Bell's palsy     PAST SURGICAL HISTORY: Past Surgical History  Procedure Laterality Date  . Wisdom tooth extraction      FAMILY HISTORY: Family History  Problem Relation Age of Onset  . Depression Mother   . Diabetes Father   . Heart disease Father     SOCIAL HISTORY: Social History   Social History  . Marital Status: Single    Spouse Name: N/A  . Number of Children: 0  . Years of Education: Some  colle   Occupational History  . [employer] - customer service    Social History Main Topics  . Smoking status: Former Smoker    Quit date: 01/11/2014  . Smokeless tobacco: Not on file  . Alcohol Use: 0.0 oz/week    0 Standard drinks or equivalent per week     Comment: moderate  . Drug Use: No  . Sexual Activity: Not on file   Other Topics Concern  . Not on file   Social History Narrative   Right handed.   Lives at home with roommates.   Caffeine occasionally - not daily.      PHYSICAL EXAM  Filed Vitals:   12/19/14 1454  BP: 128/74  Pulse: 88  Resp: 20  Height:  (1.651 m)  Weight: 239 lb (108.41 kg)   Body mass index is 39.77 kg/(m^2).  Generalized: Well developed, in no acute distress   Neurological examination  Mentation: Alert oriented to time, place, history taking. Follows all commands speech and language fluent Cranial nerve II-XII: Pupils were equal round reactive to light. Extraocular movements were full, visual field were full on confrontational test. Facial sensation and strength were normal. Mild right eye closure. Uvula tongue midline. Head turning and shoulder shrug  were normal and symmetric. Weakness with cheek puffing on the right. Hearing is intact to finger rubbing. HEENT: Ear canals are clear bilaterally. Tympanic membranes intact and light reflex present bilaterally. Motor: The motor testing reveals 5 over 5 strength of all 4 extremities. Good symmetric motor tone is noted throughout.  Sensory: Sensory testing is intact to soft touch on all 4 extremities. No evidence of extinction is noted.  Coordination: Cerebellar testing reveals good finger-nose-finger and heel-to-shin bilaterally.  Gait and station: Gait is normal. Tandem gait is normal. Romberg is negative. No drift is seen.  Reflexes: Deep tendon reflexes are symmetric and normal bilaterally.   DIAGNOSTIC DATA (LABS, IMAGING, TESTING) - I reviewed patient records, labs, notes, testing  and imaging myself where available.  Lab Results  Component Value Date   WBC 16.3* 02/07/2014   HGB 12.3 02/07/2014   HCT 37.3 02/07/2014   MCV 81 02/07/2014   PLT 405* 10/22/2012      Component Value Date/Time   NA 138 02/07/2014 1658   NA 136 10/22/2012 0208   K 4.0 02/07/2014 1658   CL 98 02/07/2014 1658   CO2 25 02/07/2014 1658   GLUCOSE 100* 02/07/2014 1658   GLUCOSE 94 10/22/2012 0208   BUN 9 02/07/2014 1658   BUN 9 10/22/2012 0208   CREATININE 0.71 02/07/2014 1658   CALCIUM 9.6 02/07/2014 1658   PROT 6.6 02/07/2014 1658   ALBUMIN 4.3 02/07/2014 1658   AST 10 02/07/2014 1658   ALT 12 02/07/2014 1658   ALKPHOS 73 02/07/2014 1658   BILITOT <0.2 02/07/2014 1658   GFRNONAA 123 02/07/2014 1658   GFRAA 142 02/07/2014 1658      ASSESSMENT AND PLAN 19 y.o. year old female  has a past medical history of Hypertension and Bell's palsy. here with:  1.  Bell's palsy 2. Shingles 3. Intermittent hearing loss right  The patient's facial weakness has improved. She does have intermittent hearing loss on the right side. She felt that this started after stopping gabapentin. Initially I was going to restart gabapentin however after consulting with Dr. Terrace Arabia we will hold off on restarting gabapentin and refer the patient to ENT for evaluation. Patient is amenable to this plan. She will follow-up in 3 months or sooner if needed.  Butch Penny, MSN, NP-C 12/19/2014, 3:25 PM [facility] [address] [place], [place] [address] [phone]

## 2014-12-20 NOTE — Progress Notes (Signed)
I have reviewed and agreed above plan. 

## 2014-12-23 NOTE — Telephone Encounter (Signed)
Gabapentin was ordered but after Consulting with Dr. Terrace ArabiaYan it was decided to not restart gabapentin but rather refer the patient to ENT. Alverda SkeansSandy Young RN called pharmacy to cancel refill.

## 2015-03-27 ENCOUNTER — Encounter: Payer: Self-pay | Admitting: Adult Health

## 2015-03-27 ENCOUNTER — Ambulatory Visit (INDEPENDENT_AMBULATORY_CARE_PROVIDER_SITE_OTHER): Payer: BC Managed Care – PPO | Admitting: Adult Health

## 2015-03-27 VITALS — BP 142/102 | HR 81 | Resp 22 | Ht 66.0 in | Wt 238.0 lb

## 2015-03-27 DIAGNOSIS — G510 Bell's palsy: Secondary | ICD-10-CM | POA: Diagnosis not present

## 2015-03-27 NOTE — Patient Instructions (Signed)
If your symptoms worsen or you develop new symptoms please let us know.   

## 2015-03-27 NOTE — Progress Notes (Signed)
I have reviewed and agreed above plan. 

## 2015-03-27 NOTE — Progress Notes (Signed)
PATIENT: Stacy Wang DOB: July 15, 1995  REASON FOR VISIT: follow up HISTORY FROM: patient  HISTORY OF PRESENT ILLNESS: Today 03/27/2015:  Ms. Stacy Wang is a 20 year old female with a history of Bell's palsy and shingles. She returns today for follow-up. The patient is not having any discomfort in the right side of the face. At the last visit she was complaining of some intermittent hearing loss however she followed up with ENT and was diagnosed with having fluid behind the ear. She states that the symptoms have since resolved. The patient states that she continues to have some weakness in the lower portion of the face on the right side. She states occasionally she'll have a hard time with food exiting the mouth on the right side. She has learned to accommodate this. She denies any new neurological symptoms. She returns today for an evaluation.  12/19/2014 (MM): Ms. Stacy Wang is a 20 year old female with a history of Bell's palsy shingles. She returns today for follow-up. The patient reports that her facial weakness has improved. She continues to have some issues with eye closure on the right. The patient reports that she is no longer taking gabapentin. She started about 2 months ago after her prescription expired. She states that she is not having any discomfort in the right side of the face. She does state that since stopping gabapentin she's been having intermittent hearing loss on the right side. She states that her hearing will decrease to a whisper and then spontaneously return to her baseline. She states that these episodes happen 2-3 times in the last 2 months. She states that she did have some changes with her initial symptoms. She states that approximately 2 months ago she did have what she thought was the flu. Since then she has not been sick. She denies any new neurological symptoms. She returns today for an evaluation.  HISTORY [place](YAN): Stacy Wang is a [age] years old right-handed  African-American female, accompanied by her father, referred by emergency room for evaluation of right Bell's palsy  She had a history of migraine headaches, she suffered prolonged severe headaches in January 25 2014, at evening of January 28 2014, she noticed sharp pain behind her right ear, which is different from her regular migraine, she also noticed difficulty moving her right mouth, difficulty closing her right eye, she could not taste her food at the right side of her tongue, woke up with worsening symptoms next morning,  Presented to the emergency room, was diagnosed with right Bell's palsy, was given the prescription of prednisone, tapering for 10 days,   There is mild improvement of her right facial weakness, continue have intermittent sharp pain at right ear, also noticed rashes, vesicle broken out at her right ear, no hearing loss, no visual loss, she does has a history of chickenpox as a child.  UPDATE FEb 11th 2016: She has mild right cheek pain when pressed on it, she has no significant dry eye, she can close her right eye now, but still has significant right facial weakness, right ear rash has gone.   UPDATE June 6th 2016:  She is overall better, her right facial weakness has much improved, she can close her eye better, but she noticed frequent right lower face pulling, twitching sensation, she has mild right side blurry vision,   She has frequent headaches REVIEW OF SYSTEMS: Out of a complete 14 system review of symptoms, the patient complains only of the following symptoms, and all other reviewed systems are negative.  ALLERGIES: Allergies  Allergen Reactions  . Pollen Extract Itching    HOME MEDICATIONS: Outpatient Prescriptions Prior to Visit  Medication Sig Dispense Refill  . levonorgestrel-ethinyl estradiol (AVIANE,ALESSE,LESSINA) 0.1-20 MG-MCG tablet Take 1 tablet by mouth daily.    Marland Kitchen artificial tears (LACRILUBE) OINT ophthalmic ointment Place into the right eye at  bedtime as needed for dry eyes. 1 Tube 3   No facility-administered medications prior to visit.    PAST MEDICAL HISTORY: Past Medical History  Diagnosis Date  . Hypertension   . Bell's palsy     PAST SURGICAL HISTORY: Past Surgical History  Procedure Laterality Date  . Wisdom tooth extraction      FAMILY HISTORY: Family History  Problem Relation Age of Onset  . Depression Mother   . Diabetes Father   . Heart disease Father     SOCIAL HISTORY: Social History   Social History  . Marital Status: Single    Spouse Name: N/A  . Number of Children: 0  . Years of Education: Some colle   Occupational History  . [employer] - customer service    Social History Main Topics  . Smoking status: Light Tobacco Smoker  . Smokeless tobacco: Not on file  . Alcohol Use: 0.0 oz/week    0 Standard drinks or equivalent per week     Comment: moderate  . Drug Use: No  . Sexual Activity: Not on file   Other Topics Concern  . Not on file   Social History Narrative   Right handed.   Lives at home with roommates.   Caffeine occasionally - not daily.      PHYSICAL EXAM  Filed Vitals:   03/27/15 1026  BP: 142/102  Pulse: 81  Resp: 22  Height:  (1.676 m)  Weight: 238 lb (107.956 kg)   Body mass index is 38.43 kg/(m^2).  Generalized: Well developed, in no acute distress   Neurological examination  Mentation: Alert oriented to time, place, history taking. Follows all commands speech and language fluent Cranial nerve II-XII: Pupils were equal round reactive to light. Extraocular movements were full, visual field were full on confrontational test. Facial sensation and strength were normal. Uvula tongue midline. Head turning and shoulder shrug  were normal and symmetric. puff cheek weakness on the right. Good eye closure. Motor: The motor testing reveals 5 over 5 strength of all 4 extremities. Good symmetric motor tone is noted throughout.  Sensory: Sensory testing is intact  to soft touch on all 4 extremities. No evidence of extinction is noted.  Coordination: Cerebellar testing reveals good finger-nose-finger and heel-to-shin bilaterally.  Gait and station: Gait is normal. Tandem gait is normal. Romberg is negative. No drift is seen.  Reflexes: Deep tendon reflexes are symmetric and normal bilaterally.   DIAGNOSTIC DATA (LABS, IMAGING, TESTING) - I reviewed patient records, labs, notes, testing and imaging myself where available.   ASSESSMENT AND PLAN 20 y.o. year old female  has a past medical history of Hypertension and Bell's palsy. here with:  1. Bell's palsy 2. Shingles  The patient has remained stable. She continues to have weakness in the right lower face. She is not having any new symptoms. Denies any discomfort in the face. Patient advised that she can follow up with our office on an as-needed basis.  Butch Penny, MSN, NP-C 03/27/2015, 10:54 AM [facility] [address], [address] [place], [place] [address] [phone]

## 2015-09-24 ENCOUNTER — Ambulatory Visit (HOSPITAL_COMMUNITY): Admission: EM | Admit: 2015-09-24 | Payer: BC Managed Care – PPO | Source: Home / Self Care

## 2015-09-25 ENCOUNTER — Ambulatory Visit (HOSPITAL_COMMUNITY)
Admission: EM | Admit: 2015-09-25 | Discharge: 2015-09-25 | Disposition: A | Discharge status: Home / Self Care | Payer: BC Managed Care – PPO

## 2015-09-25 ENCOUNTER — Encounter (HOSPITAL_COMMUNITY): Payer: Self-pay | Admitting: Emergency Medicine

## 2015-09-25 DIAGNOSIS — G4489 Other headache syndrome: Secondary | ICD-10-CM | POA: Diagnosis not present

## 2015-09-25 DIAGNOSIS — I10 Essential (primary) hypertension: Secondary | ICD-10-CM

## 2015-09-25 DIAGNOSIS — M542 Cervicalgia: Secondary | ICD-10-CM

## 2015-09-25 MED ORDER — CYCLOBENZAPRINE HCL 10 MG PO TABS: 10.0000 mg | ORAL_TABLET | Freq: Two times a day (BID) | ORAL | 0 refills | Status: DC | PRN

## 2015-09-25 MED ORDER — AMLODIPINE BESYLATE 5 MG PO TABS: 5.0000 mg | ORAL_TABLET | Freq: Every day | ORAL | 0 refills | Status: DC

## 2015-09-25 MED ORDER — NAPROXEN 500 MG PO TABS: 500.0000 mg | ORAL_TABLET | Freq: Two times a day (BID) | ORAL | 0 refills | Status: DC

## 2015-09-25 NOTE — ED Notes (Signed)
Stacy Wang rechecked BP manually... Notified him to document the new reading.

## 2015-09-25 NOTE — ED Provider Notes (Signed)
CSN: [id number]     Arrival date & time 09/25/15  1006 History   First MD Initiated Contact with Patient 09/25/15 1115     Chief Complaint  Patient presents with  . Hypertension  . Knee Pain   (Consider location/radiation/quality/duration/timing/severity/associated sxs/prior Treatment)  Hypertension   Knee Pain    Past Medical History:  Diagnosis Date  . Bell's palsy   . Hypertension    Past Surgical History:  Procedure Laterality Date  . WISDOM TOOTH EXTRACTION     Family History  Problem Relation Age of Onset  . Depression Mother   . Diabetes Father   . Heart disease Father    Social History  Substance Use Topics  . Smoking status: Light Tobacco Smoker  . Smokeless tobacco: Never Used  . Alcohol use 0.0 oz/week     Comment: moderate   OB History    No data available     Review of Systems  Constitutional: Negative.   HENT: Negative.   Eyes: Negative.   Respiratory: Negative.   Cardiovascular: Negative.   Gastrointestinal: Negative.   Endocrine: Negative.   Genitourinary: Negative.   Musculoskeletal: Positive for myalgias and neck stiffness.  Allergic/Immunologic: Negative.   Neurological: Negative.   Hematological: Negative.   Psychiatric/Behavioral: Negative.     Allergies  Pollen extract  Home Medications   Prior to Admission medications   Medication Sig Start Date End Date Taking? Authorizing Provider  busPIRone (BUSPAR) 5 MG tablet Take 5 mg by mouth 3 (three) times daily.   Yes Historical Provider, MD  Cariprazine HCl (VRAYLAR PO) Take by mouth.   Yes Historical Provider, MD  levonorgestrel-ethinyl estradiol (AVIANE,ALESSE,LESSINA) 0.1-20 MG-MCG tablet Take 1 tablet by mouth daily.   Yes Historical Provider, MD  amLODipine (NORVASC) 5 MG tablet Take 1 tablet (5 mg total) by mouth daily. 09/25/15   Deatra Canter, FNP  cyclobenzaprine (FLEXERIL) 10 MG tablet Take 1 tablet (10 mg total) by mouth 2 (two) times daily as needed for muscle spasms.  09/25/15   Deatra Canter, FNP  naproxen (NAPROSYN) 500 MG tablet Take 1 tablet (500 mg total) by mouth 2 (two) times daily with a meal. 09/25/15   Deatra Canter, FNP   Meds Ordered and Administered this Visit  Medications - No data to display  BP (S) (!) 192/133 (BP Location: Right Arm) Comment: reported elevated BP to nurse Nira Conn, and CMA [place] Drone  Pulse 84   Temp 98.5 F (36.9 C) (Oral)   Resp 16   SpO2 100%  No data found.   Physical Exam  Constitutional: She appears well-developed and well-nourished.  HENT:  Head: Normocephalic and atraumatic.  Right Ear: External ear normal.  Left Ear: External ear normal.  Eyes: Conjunctivae and EOM are normal. Pupils are equal, round, and reactive to light.  Neck: Normal range of motion. Neck supple.  Cardiovascular: Normal rate, regular rhythm and normal heart sounds.   Pulmonary/Chest: Effort normal and breath sounds normal.  Musculoskeletal: She exhibits tenderness.  TTP right sternocleidomastoid muscle  Nursing note and vitals reviewed.   Urgent Care Course   Clinical Course    Procedures (including critical care time)  Labs Review Labs Reviewed - No data to display  Imaging Review No results found.   Visual Acuity Review  Right Eye Distance:   Left Eye Distance:   Bilateral Distance:    Right Eye Near:   Left Eye Near:    Bilateral Near:  MDM   1. Essential hypertension   2. Cervicalgia   3. Other headache syndrome   BP repeated with stethescope and sphygnomometer.  Right 150/100 left 154/110 Amlodipine 5mg  one po qd #14 Naprosyn 500mg  one po bid x 7 days #14 Flexeril 10mg  one po bid prn #14 Follow up with PCP today.  She has PCP but no one called her back so she is going to office now to make an appointment.     Deatra CanterWilliam J Arlethia Basso, FNP 09/25/15 1205    Deatra CanterWilliam J Pheonix Clinkscale, FNP 09/25/15 [phone]

## 2015-09-25 NOTE — ED Triage Notes (Signed)
Pt is here for HTN associated w/HA... BP today is 192/133... Does not have a PCP  Denies CP, SOB, diaphoresis, weakness, blurred vision  Also c/o bilateral knee pain onset 2 weeks  Reports she has been exercising lately  A&O x4... NAD

## 2015-10-29 DIAGNOSIS — R030 Elevated blood-pressure reading, without diagnosis of hypertension: Secondary | ICD-10-CM | POA: Insufficient documentation

## 2016-08-10 ENCOUNTER — Ambulatory Visit (INDEPENDENT_AMBULATORY_CARE_PROVIDER_SITE_OTHER): Payer: BC Managed Care – PPO | Admitting: Physician Assistant

## 2016-08-10 ENCOUNTER — Encounter: Payer: Self-pay | Admitting: Physician Assistant

## 2016-08-10 VITALS — BP 136/100 | HR 89 | Temp 98.2°F | Resp 18 | Ht 67.5 in | Wt 256.0 lb

## 2016-08-10 DIAGNOSIS — J069 Acute upper respiratory infection, unspecified: Secondary | ICD-10-CM | POA: Diagnosis not present

## 2016-08-10 MED ORDER — GUAIFENESIN ER 1200 MG PO TB12: 1.0000 | ORAL_TABLET | Freq: Two times a day (BID) | ORAL | 1 refills | Status: DC | PRN

## 2016-08-10 MED ORDER — BENZONATATE 100 MG PO CAPS: 100.0000 mg | ORAL_CAPSULE | Freq: Three times a day (TID) | ORAL | 0 refills | Status: DC | PRN

## 2016-08-10 NOTE — Progress Notes (Signed)
[facility] [address], [place] [id number] [phone]  Date:  08/10/2016   Name:  Stacy Wang   DOB:  1995-04-28   MRN:  [id number]  PCP:  Patient, No Pcp Per    History of Present Illness:  Stacy Wang is a 21 y.o. female patient who presents to PCP with  Chief Complaint  Patient presents with  . Cough  . Sore Throat  . Emesis  . Depression     4 days ago, coughing, and sore throat.  Yesterday she had some nausea and vomiting.  She states that she is coughing to the point of nausea and vomiting.  She is coughing up green phlegm.  She has no congestion.  No dyspnea or sob.  She felt fever and chills, subjectively.   No known sick contacts. She took mucinex, and pain killers (tylenol). Depression screening was positive... Lamictal, buspar, and latuda.  They worked [employer].  Followed by a psychiatrist whom she is seeing tomorrow.  No suicidal homicidal changes or plan. Complaint with medications. Patient Active Problem List   Diagnosis Date Noted  . Right-sided Bell's palsy 02/07/2014  . Shingles 02/07/2014    Past Medical History:  Diagnosis Date  . Bell's palsy   . Hypertension     Past Surgical History:  Procedure Laterality Date  . WISDOM TOOTH EXTRACTION      Social History  Substance Use Topics  . Smoking status: Light Tobacco Smoker    Types: E-cigarettes  . Smokeless tobacco: Never Used  . Alcohol use 0.0 oz/week     Comment: moderate    Family History  Problem Relation Age of Onset  . Depression Mother   . Diabetes Father   . Heart disease Father     Allergies  Allergen Reactions  . Pollen Extract Itching    Medication list has been reviewed and updated.  Current Outpatient Prescriptions on File Prior to Visit  Medication Sig Dispense Refill  . amLODipine (NORVASC) 5 MG tablet Take 1 tablet (5 mg total) by mouth daily. 14 tablet 0  . busPIRone (BUSPAR) 5 MG tablet Take 5 mg by mouth 3 (three) times daily.    .  Cariprazine HCl (VRAYLAR PO) Take by mouth.    . cyclobenzaprine (FLEXERIL) 10 MG tablet Take 1 tablet (10 mg total) by mouth 2 (two) times daily as needed for muscle spasms. 20 tablet 0  . levonorgestrel-ethinyl estradiol (AVIANE,ALESSE,LESSINA) 0.1-20 MG-MCG tablet Take 1 tablet by mouth daily.    . naproxen (NAPROSYN) 500 MG tablet Take 1 tablet (500 mg total) by mouth 2 (two) times daily with a meal. 20 tablet 0   No current facility-administered medications on file prior to visit.     ROS ROS otherwise unremarkable unless listed above.  Physical Examination: BP (!) 136/100   Pulse 89   Temp 98.2 F (36.8 C) (Oral)   Resp 18   Ht 5' 7.5" (1.715 m)   Wt 256 lb (116.1 kg)   LMP 08/04/2016 (Approximate)   SpO2 98%   BMI 39.50 kg/m  Ideal Body Weight: Weight in (lb) to have BMI = 25: 161.7  Physical Exam  Constitutional: She is oriented to person, place, and time. She appears well-developed and well-nourished. No distress.  HENT:  Head: Normocephalic and atraumatic.  Right Ear: Tympanic membrane, external ear and ear canal normal.  Left Ear: Tympanic membrane, external ear and ear canal normal.  Nose: Mucosal edema and rhinorrhea present. Right sinus exhibits  no maxillary sinus tenderness and no frontal sinus tenderness. Left sinus exhibits no maxillary sinus tenderness and no frontal sinus tenderness.  Mouth/Throat: No uvula swelling. No oropharyngeal exudate, posterior oropharyngeal edema or posterior oropharyngeal erythema.  Eyes: Pupils are equal, round, and reactive to light. Conjunctivae and EOM are normal.  Cardiovascular: Normal rate and regular rhythm.  Exam reveals no gallop, no distant heart sounds and no friction rub.   No murmur heard. Pulmonary/Chest: Effort normal. No respiratory distress. She has no decreased breath sounds. She has no wheezes. She has no rhonchi.  Lymphadenopathy:       Head (right side): No submandibular, no tonsillar, no preauricular and no  posterior auricular adenopathy present.       Head (left side): No submandibular, no tonsillar, no preauricular and no posterior auricular adenopathy present.  Neurological: She is alert and oriented to person, place, and time.  Skin: She is not diaphoretic.  Psychiatric: She has a normal mood and affect. Her behavior is normal.     Assessment and Plan: Stacy Wang is a 21 y.o. female who is here today for  Chief Complaint  Patient presents with  . Cough  . Sore Throat  . Emesis  . Depression   Acute upper respiratory infection - Plan: Guaifenesin (MUCINEX MAXIMUM STRENGTH) 1200 MG TB12, benzonatate (TESSALON) 100 MG capsule  Trena PlattStephanie Lance Galas, PA-C [facility] 7/31/20188:36 PM

## 2016-08-10 NOTE — Patient Instructions (Addendum)
Please hydrate well with 64 oz if not more.     Upper Respiratory Infection, Adult Most upper respiratory infections (URIs) are caused by a virus. A URI affects the nose, throat, and upper air passages. The most common type of URI is often called "the common cold." Follow these instructions at home:  Take medicines only as told by your doctor.  Gargle warm saltwater or take cough drops to comfort your throat as told by your doctor.  Use a warm mist humidifier or inhale steam from a shower to increase air moisture. This may make it easier to breathe.  Drink enough fluid to keep your pee (urine) clear or pale yellow.  Eat soups and other clear broths.  Have a healthy diet.  Rest as needed.  Go back to work when your fever is gone or your doctor says it is okay. ? You may need to stay home longer to avoid giving your URI to others. ? You can also wear a face mask and wash your hands often to prevent spread of the virus.  Use your inhaler more if you have asthma.  Do not use any tobacco products, including cigarettes, chewing tobacco, or electronic cigarettes. If you need help quitting, ask your doctor. Contact a doctor if:  You are getting worse, not better.  Your symptoms are not helped by medicine.  You have chills.  You are getting more short of breath.  You have brown or red mucus.  You have yellow or brown discharge from your nose.  You have pain in your face, especially when you bend forward.  You have a fever.  You have puffy (swollen) neck glands.  You have pain while swallowing.  You have white areas in the back of your throat. Get help right away if:  You have very bad or constant: ? Headache. ? Ear pain. ? Pain in your forehead, behind your eyes, and over your cheekbones (sinus pain). ? Chest pain.  You have long-lasting (chronic) lung disease and any of the following: ? Wheezing. ? Long-lasting cough. ? Coughing up blood. ? A change in your  usual mucus.  You have a stiff neck.  You have changes in your: ? Vision. ? Hearing. ? Thinking. ? Mood. This information is not intended to replace advice given to you by your health care provider. Make sure you discuss any questions you have with your health care provider. Document Released: 06/16/2007 Document Revised: 08/31/2015 Document Reviewed: 04/04/2013 Elsevier Interactive Patient Education  2018 [employer]    IF you received an x-ray today, you will receive an invoice from [facility]. Please contact [facility] at [phone] with questions or concerns regarding your invoice.   IF you received labwork today, you will receive an invoice from [facility]. Please contact [facility] at [phone] with questions or concerns regarding your invoice.   Our billing staff will not be able to assist you with questions regarding bills from these companies.  You will be contacted with the lab results as soon as they are available. The fastest way to get your results is to activate your My Chart account. Instructions are located on the last page of this paperwork. If you have not heard from us regarding the results in 2 weeks, please contact this office.

## 2016-10-05 ENCOUNTER — Ambulatory Visit (INDEPENDENT_AMBULATORY_CARE_PROVIDER_SITE_OTHER): Payer: BC Managed Care – PPO | Admitting: Pulmonary Disease

## 2016-10-05 ENCOUNTER — Encounter: Payer: Self-pay | Admitting: Pulmonary Disease

## 2016-10-05 VITALS — BP 138/90 | HR 64 | Ht 67.0 in | Wt 259.0 lb

## 2016-10-05 DIAGNOSIS — Z6841 Body Mass Index (BMI) 40.0 and over, adult: Secondary | ICD-10-CM

## 2016-10-05 DIAGNOSIS — J351 Hypertrophy of tonsils: Secondary | ICD-10-CM

## 2016-10-05 DIAGNOSIS — R29818 Other symptoms and signs involving the nervous system: Secondary | ICD-10-CM | POA: Diagnosis not present

## 2016-10-05 DIAGNOSIS — J309 Allergic rhinitis, unspecified: Secondary | ICD-10-CM

## 2016-10-05 MED ORDER — FLUTICASONE PROPIONATE 50 MCG/ACT NA SUSP: 2.0000 | Freq: Every day | NASAL | 2 refills | Status: DC

## 2016-10-05 MED ORDER — MONTELUKAST SODIUM 10 MG PO TABS: 10.0000 mg | ORAL_TABLET | Freq: Every day | ORAL | 5 refills | Status: DC

## 2016-10-05 NOTE — Patient Instructions (Signed)
Will arrange for home sleep study  Montelukast (singulair) 10 mg pill nightly  Fluticasone one spray each nostril daily  Use over the counter antihistamine pill daily: can try claritin, allegra, zyrtec, or xyzal  Will schedule follow up after sleep study reviewed

## 2016-10-05 NOTE — Progress Notes (Signed)
Past Surgical History She  has a past surgical history that includes Wisdom tooth extraction.  Allergies  Allergen Reactions  . Pollen Extract Itching    Family History Her family history includes Depression in her mother; Diabetes in her father; Heart disease in her father.  Social History She  reports that she has been smoking E-cigarettes.  She has a 0.50 pack-year smoking history. She has never used smokeless tobacco. She reports that she drinks alcohol. She reports that she does not use drugs.  Review of systems Constitutional: Negative for fever and unexpected weight change.  HENT: Positive for congestion and postnasal drip. Negative for dental problem, ear pain, nosebleeds, rhinorrhea, sinus pressure, sneezing, sore throat and trouble swallowing.   Eyes: Negative for redness and itching.  Respiratory: Positive for cough, chest tightness and shortness of breath. Negative for wheezing.   Cardiovascular: Negative for palpitations and leg swelling.  Gastrointestinal: Negative for nausea and vomiting.  Genitourinary: Negative for dysuria.  Musculoskeletal: Negative for joint swelling.  Skin: Negative for rash.  Allergic/Immunologic: Negative.  Negative for environmental allergies, food allergies and immunocompromised state.  Neurological: Positive for dizziness and headaches.  Hematological: Does not bruise/bleed easily.  Psychiatric/Behavioral: Positive for dysphoric mood. The patient is nervous/anxious.     Current Outpatient Prescriptions on File Prior to Visit  Medication Sig  . busPIRone (BUSPAR) 5 MG tablet Take 5 mg by mouth 3 (three) times daily.   No current facility-administered medications on file prior to visit.     Chief Complaint  Patient presents with  . Sleep Consult    self referral. Pt use to sleep walk, goes to restroom room during the night not sure how many times, and doesn't remember using the restroom. Pt on avg sleeps at 1am wake up 7am each day. Pt  is sleeping during the day.    Past medical history She  has a past medical history of Bell's palsy and Hypertension.  Vital signs BP 138/90 (BP Location: Right Wrist, Cuff Size: Normal)   Pulse 64   Ht  (1.702 m)   Wt 259 lb (117.5 kg)   SpO2 98%   BMI 40.57 kg/m   History of Present Illness Stacy Wang is a 21 y.o. female for evaluation of sleep problems.  She has noticed trouble with her sleep for years.  This has been getting worse.  She snores, and has been told she stops breathing while asleep.  She gets frequent dreams.  She will go to the bathroom at night and not remember going.  She can fall asleep easily anytime during the day.  She goes to sleep at 1 am.  She falls asleep quickly most nights.  She wakes up some times to use the bathroom.  She gets out of bed at 9 am.  She feels tired in the morning.  She denies morning headache.  She does not use anything to help her stay awake.  She takes medication for anxiety, and this helps her sleep also.  She gets sinus congestion and post nasal drip from allergies.  She has been using prn fluticasone.  She has been told her tonsils are big, but hasn't been seen by ENT.  She had shingles several years ago and developed Bell's palsy affecting the right side of her face.  She denies sleep walking, sleep talking, bruxism, or nightmares.  There is no history of restless legs.  She denies sleep hallucinations, sleep paralysis, or cataplexy.  The Epworth score is 12 out  of 24.   Physical Exam:  General - No distress ENT - No sinus tenderness, no oral exudate, no LAN, no thyromegaly, TM clear, pupils equal/reactive, 3+ tonsils, right facial droop Cardiac - s1s2 regular, no murmur, pulses symmetric Chest - No wheeze/rales/dullness, good air entry, normal respiratory excursion Back - No focal tenderness Abd - Soft, non-tender, no organomegaly, + bowel sounds Ext - No edema Neuro - Normal strength, cranial nerves intact Skin  - No rashes Psych - Normal mood, and behavior  Discussion: 21 yo female with snoring, sleep disruption, witnessed apnea and daytime sleepiness.  Her BMI is > 35 and she has mood disorder with anxiety.  I am concerned she could have sleep apnea.  She likely also is having confusional arousals related to sleep disruption from sleep apnea.  She also has history of allergic rhinitis and tonsillar hypertrophy.  This could be contributing to narrowing of her upper airway, and making her more prone to sleep apnea.  Assessment/plan:  Suspected sleep apnea. - will arrange for home sleep study to further assess  Allergic rhinitis with tonsillar hypertrophy. - will have her use flonase, singulair, and OTC antihistamine on daily basis until next visit - if she is found to have sleep apnea and tonsils remained enlarged, then she might benefit from ENT evaluation  Obesity. - discussed importance of weight loss   Patient Instructions  Will arrange for home sleep study  Montelukast (singulair) 10 mg pill nightly  Fluticasone one spray each nostril daily  Use over the counter antihistamine pill daily: can try claritin, allegra, zyrtec, or xyzal  Will schedule follow up after sleep study reviewed    Coralyn Helling, M.D. Pager [phone] 10/05/2016, 11:47 AM

## 2016-10-05 NOTE — Progress Notes (Signed)
   Subjective:    Patient ID: Stacy Wang, female    DOB: 09/10/1995, 21 y.o.   MRN: [id number]  HPI    Review of Systems  Constitutional: Negative for fever and unexpected weight change.  HENT: Positive for congestion and postnasal drip. Negative for dental problem, ear pain, nosebleeds, rhinorrhea, sinus pressure, sneezing, sore throat and trouble swallowing.   Eyes: Negative for redness and itching.  Respiratory: Positive for cough, chest tightness and shortness of breath. Negative for wheezing.   Cardiovascular: Negative for palpitations and leg swelling.  Gastrointestinal: Negative for nausea and vomiting.  Genitourinary: Negative for dysuria.  Musculoskeletal: Negative for joint swelling.  Skin: Negative for rash.  Allergic/Immunologic: Negative.  Negative for environmental allergies, food allergies and immunocompromised state.  Neurological: Positive for dizziness and headaches.  Hematological: Does not bruise/bleed easily.  Psychiatric/Behavioral: Positive for dysphoric mood. The patient is nervous/anxious.        Objective:   Physical Exam        Assessment & Plan:

## 2016-10-27 ENCOUNTER — Telehealth: Payer: Self-pay | Admitting: Pulmonary Disease

## 2016-10-27 NOTE — Telephone Encounter (Signed)
Mailed letter today to patient to contact us regarding HST.

## 2016-10-27 NOTE — Telephone Encounter (Signed)
Rec'd staff [facility] [facility] 10/26/2016 at 3:14 PM regarding order for home sleep study on patient per VS. She advised that the order was placed for home sleep study. Cordelia PenSherry has called patient 3 times & have left her a vm each time for her to call  back to schedule & pt has not called or contacted office at this time. Forward message to VS to further advise on what steps to proceed for this patient at this time regarding the HST.

## 2016-11-18 ENCOUNTER — Other Ambulatory Visit: Payer: Self-pay | Admitting: Pulmonary Disease

## 2016-12-16 ENCOUNTER — Encounter: Payer: Self-pay | Admitting: Physician Assistant

## 2016-12-16 ENCOUNTER — Other Ambulatory Visit: Payer: Self-pay

## 2016-12-16 ENCOUNTER — Ambulatory Visit (INDEPENDENT_AMBULATORY_CARE_PROVIDER_SITE_OTHER): Payer: BC Managed Care – PPO | Admitting: Physician Assistant

## 2016-12-16 VITALS — BP 135/101 | HR 99 | Temp 98.5°F | Resp 20 | Ht 67.32 in | Wt 254.6 lb

## 2016-12-16 DIAGNOSIS — F329 Major depressive disorder, single episode, unspecified: Secondary | ICD-10-CM

## 2016-12-16 DIAGNOSIS — G43109 Migraine with aura, not intractable, without status migrainosus: Secondary | ICD-10-CM | POA: Diagnosis not present

## 2016-12-16 DIAGNOSIS — R4589 Other symptoms and signs involving emotional state: Secondary | ICD-10-CM

## 2016-12-16 DIAGNOSIS — R45851 Suicidal ideations: Secondary | ICD-10-CM

## 2016-12-16 MED ORDER — KETOROLAC TROMETHAMINE 60 MG/2ML IM SOLN
60.0000 mg | Freq: Once | INTRAMUSCULAR | Status: AC
  Administered 2016-12-16: 60 mg via INTRAMUSCULAR

## 2016-12-16 NOTE — Progress Notes (Signed)
Stacy Ceollyah Amezcua  MRN: [id number] DOB: September 13, 1995  Subjective:   Stacy Wang is a 21 y.o. female who presents for evaluation of headache. She has been dealing with headaches since age 21. This particular headache began 5 days ago. It has been coming and going every 2 hours. It starts out behind her neck and wraps around to right side of face. . Work attendance or other daily activities have been affected by this headache. Precipitating factors include: stress. The headaches are usually preceded by an aura consisting of blurry vision and visual field loss. Associated neurologic symptoms: decreased vision, depression and worsening school/work performance. The patient denies dizziness, loss of balance, muscle weakness, numbness of extremities, speech difficulties, nausea, and vomiting in the early morning.  Home treatment for this particular episode has included nothing. Last headache was a couple of months ago. Other history includes: complex headache and Bell's palsy on right side. She has residual effects from Bell's palsy including right side partial paralysis and weakness.  Notes the symptoms have not worsened.  Family history includes no known family members with significant headaches. She has been followed by neurology after Bell's palsy in 2016, but eventually stopped going. She has never been followed by headache clinic. Smokes e-cigs daily.   She is also having worsening depression.  Has documented a history of bipolar disorder.  She has tried multiple medications in the past.  She [place]ry[place].  Due to uncontrolled symptoms, her psychiatrist took her off all of her medications at end of November to have a clean slate.  She has a follow-up appointment with her psychiatrist at the end of January.  In the meantime, she has been meeting with her psychologist every week for counseling.  Last appointment was last Monday.  She will see them again next Monday.  Notes  that since she has been off her medication, she has been having worsening dysphoric mood and suicidal thoughts.  Denies suicidal plan.  She did not bring this up to her counselor this week.    Review of Systems  Constitutional: Negative for chills, diaphoresis and fever.  Psychiatric/Behavioral: Negative for hallucinations and self-injury. The patient is not hyperactive.     Patient Active Problem List   Diagnosis Date Noted  . Right-sided Bell's palsy 02/07/2014  . Shingles 02/07/2014    Current Outpatient Medications on File Prior to Visit  Medication Sig Dispense Refill  . busPIRone (BUSPAR) 5 MG tablet Take 5 mg by mouth 3 (three) times daily.    . fluticasone (FLONASE) 50 MCG/ACT nasal spray Place 2 sprays into both nostrils daily. 16 g 2  . montelukast (SINGULAIR) 10 MG tablet Take 1 tablet (10 mg total) by mouth at bedtime. 30 tablet 5   No current facility-administered medications on file prior to visit.     Allergies  Allergen Reactions  . Pollen Extract Itching     Objective:  BP (!) 140/99 (BP Location: Left Arm, Patient Position: Sitting, Cuff Size: Large)   Pulse (!) 103   Temp 98.5 F (36.9 C) (Oral)   Resp 20   Ht 5' 7.32" (1.71 m)   Wt 254 lb 9.6 oz (115.5 kg)   LMP 12/02/2016 (Approximate)   SpO2 99%   BMI 39.49 kg/m   Physical Exam  Constitutional: She is oriented to person, place, and time and well-developed, well-nourished, and in no distress.  HENT:  Head: Normocephalic and atraumatic.  Eyes: Conjunctivae are normal.  Neck: Normal  range of motion.  Pulmonary/Chest: Effort normal.  Neurological: She is alert and oriented to person, place, and time. She has normal sensation. She displays facial asymmetry. She displays normal speech. She has a normal Finger-Nose-Finger Test, a normal Heel to [employer] Test, a normal Romberg Test and a normal Tandem Gait Test. Gait normal.  Reflex Scores:      Tricep reflexes are 2+ on the right side and 2+ on the left  side.      Bicep reflexes are 2+ on the right side and 2+ on the left side.      Brachioradialis reflexes are 2+ on the right side and 2+ on the left side.      Patellar reflexes are 2+ on the right side and 2+ on the left side.      Achilles reflexes are 2+ on the right side and 2+ on the left side. CN II-XII: Pupils are equal, round, and reactive to light. Extraocular movements and visual field intact. Facial sensation and strength were normal. Uvula tongue midline. Head turning and shoulder shrug  were normal and symmetric. Good eye closure.Facial assymetry noted. Has mild right eyelid droop and mild right corner of mouth droop.Weakness with puffing right cheek, smiling on right side, and raising right eyebrow.   Skin: Skin is warm and dry.  Psychiatric: Affect normal. She exhibits a depressed mood. She expresses suicidal ideation. She expresses no suicidal plans and no homicidal plans.  Vitals reviewed.   Depression screen [facility] 12/16/2016 08/10/2016  Decreased Interest 3 2  Down, Depressed, Hopeless 3 2  PHQ - 2 Score 6 4  Altered sleeping 3 2  Tired, decreased energy 2 2  Change in appetite 3 2  Feeling bad or failure about yourself  3 0  Trouble concentrating 3 2  Moving slowly or fidgety/restless 1 0  Suicidal thoughts 3 2  PHQ-9 Score 24 14  Difficult doing work/chores Extremely dIfficult Somewhat difficult    Assessment and Plan :  This case was precepted with Dr. Creta LevinStallings. 1. Migraine with aura and without status migrainosus, not intractable Consistent with patient's typical migraines.  No acute findings noted on neuro exam.  Will treat with Toradol injection in office.  Given referral to headache clinic for further evaluation and management of persistent migraines.  Given strict ED precautions. - ketorolac (TORADOL) injection 60 mg - AMB referral to headache clinic 2. Depressed mood 3. Suicidal thoughts Patient has worsening dysphoric mood and suicidal thoughts.  She  does not currently have an active plan.  I have contacted behavioral health and spoke with triage nurse.  She recommends that if patient is not actively suicidal to follow-up with her psychiatry as soon as possible.  I contacted patient's psychiatry office and spoke with Dr. Carie CaddyKaur's nurse and discussed patient's case.  She agrees that patient should be seen rather quickly in office.  She has scheduled her for an appointment with Dr. Evelene CroonKaur in 2 days on 12/18/16.  Encouraged me to inform patient that if her symptoms worsen within the next 2 days to call the office and they will get her in sooner.  If they are not open, [facility].  Patient is satisfied with this plan and agrees to seek care immediately if any of her symptoms worsen or she develops a suicidal plan.  Patient given information on suicide hotline and local emergency services.  Benjiman CoreBrittany Senai Kingsley PA-C  [facility]  [facility] 12/16/2016  9:28 AM

## 2016-12-16 NOTE — Progress Notes (Deleted)
Subjective:    Stacy Wang is a 21 y.o. female who presents for evaluation of headache. Symptoms began about {1-10:13787} {time; units:19136} ago. Generally, the headaches last about {1-10:13787} {time units:19031::"hours"} and occur {frequencies:15955}. The headaches {time related comments:13919}. The headaches are usually {headache description:13917} and are located in ***.  The patient rates her most severe headaches a {1-10:18281} on a scale from 1 to 10. Recently, the headaches have been {severity change:13918}. Work attendance or other daily activities {are/are not:16769} affected by the headaches. Precipitating factors include: {headache precipitants:13921}. The headaches are usually {aura comments:13922}. Associated neurologic symptoms: {headache neurologic symptoms default:13928}. The patient denies {headache neurologic symptoms:13928}. Home treatment has included {headache treatment:13925} with {no/little/some:19219} improvement. Other history includes: {headache hx:13930}. Family history includes {headache family history:13931}.  {Common ambulatory SmartLinks:19316}  Review of Systems {ros; complete:30496}    Objective:    {exam; complete:17964}    Assessment:    {ha dx:16528}    Plan:    {plan; headache:5281::"Lie in darkened room and apply cold packs as needed for pain.","Side effect profile discussed in detail.","Asked to keep headache diary.","Patient reassured that neurodiagnostic workup not indicated from benign H&P."}

## 2016-12-16 NOTE — Patient Instructions (Addendum)
In terms of her migraine, we have given you a shot of a strong NSAID today.  This should help tremendously with pain.  I recommend going home and resting and drinking lots of fluids.  Avoid taking any anti-inflammatories for the next 8 hours.  I have given you a referral to the headache clinic and they should contact you within the next week.  Please contact us if you do not hear from them and 2 weeks.  If any of your symptoms worsen or you develop new concerning symptoms like numbness, tingling, worsening vision, slurred speech, or other concerning symptoms please seek care immediately.  In terms of worsening depression, you have an appointment with Dr. Evelene CroonKaur on Saturday, 12/8, at 1:30 pm.  If you feel worse before Saturday you can contact Dr. Evelene CroonKaur.  If any of your suicidal thoughts worsen or you start to have active suicidal plan, please go to [facility] immediately.  Thank you for letting me participate in your health and well being.  Migraine Headache A migraine headache is a very strong throbbing pain on one side or both sides of your head. Migraines can also cause other symptoms. Talk with your doctor about what things may bring on (trigger) your migraine headaches. Follow these instructions at home: Medicines  Take over-the-counter and prescription medicines only as told by your doctor.  Do not drive or use heavy machinery while taking prescription pain medicine.  To prevent or treat constipation while you are taking prescription pain medicine, your doctor may recommend that you: ? Drink enough fluid to keep your pee (urine) clear or pale yellow. ? Take over-the-counter or prescription medicines. ? Eat foods that are high in fiber. These include fresh fruits and vegetables, whole grains, and beans. ? Limit foods that are high in fat and processed sugars. These include fried and sweet foods. Lifestyle  Avoid alcohol.  Do not use any products that contain nicotine or tobacco, such as  cigarettes and e-cigarettes. If you need help quitting, ask your doctor.  Get at least 8 hours of sleep every night.  Limit your stress. General instructions   Keep a journal to find out what may bring on your migraines. For example, write down: ? What you eat and drink. ? How much sleep you get. ? Any change in what you eat or drink. ? Any change in your medicines.  If you have a migraine: ? Avoid things that make your symptoms worse, such as bright lights. ? It may help to lie down in a dark, quiet room. ? Do not drive or use heavy machinery. ? Ask your doctor what activities are safe for you.  Keep all follow-up visits as told by your doctor. This is important. Contact a doctor if:  You get a migraine that is different or worse than your usual migraines. Get help right away if:  Your migraine gets very bad.  You have a fever.  You have a stiff neck.  You have trouble seeing.  Your muscles feel weak or like you cannot control them.  You start to lose your balance a lot.  You start to have trouble walking.  You pass out (faint). This information is not intended to replace advice given to you by your health care provider. Make sure you discuss any questions you have with your health care provider. Document Released: 10/07/2007 Document Revised: 07/18/2015 Document Reviewed: 06/16/2015 Elsevier Interactive Patient Education  2017 Elsevier Inc.  Suicidal Feelings: How to Help Yourself  Suicide is the taking of one's own life. If you feel as though life is getting too tough to handle and are thinking about suicide, get help right away. To get help:  Call your local emergency services (911 in the [place]  Call a suicide hotline to speak with a trained counselor who understands how you are feeling. The following is a list of suicide hotlines in the [place]. For a list of hotlines in [place], visit  Ink[employer]ributor.it. ? 1-800-273-TALK 281-864-2434). ? 1-800-SUICIDE 5818451833). ? (531) 689-1485. This is a hotline for Spanish speakers. ? 1-800-799-4TTY (662) 846-8914). This is a hotline for TTY users. ? 1-866-4-U-TREVOR 857-253-1855). This is a hotline for lesbian, gay, bisexual, transgender, or questioning youth.  Contact a crisis center or a local suicide prevention center. To find a crisis center or suicide prevention center: ? Call your local hospital, clinic, community service organization, mental health center, social service provider, or health department. Ask for assistance in connecting to a crisis center. ? Visit https://www.patel-king.com/ for a list of crisis centers in the [place], or visit www.suicideprevention.ca/thinking-about-suicide/find-a-crisis-centre for a list of centers in [place].  Visit the following websites: ? National Suicide Prevention Lifeline: www.suicidepreventionlifeline.org ? Hopeline: www.hopeline.com ? [employer] for Suicide Prevention: https://www.ayers.com/ ? [employer] (for lesbian, gay, bisexual, transgender, or questioning youth): www.thetrevorproject.org  How can I help myself feel better?  Promise yourself that you will not do anything drastic when you have suicidal feelings. Remember, there is hope. Many people have gotten through suicidal thoughts and feelings, and you will, too. You may have gotten through them before, and this proves that you can get through them again.  Let family, friends, teachers, or counselors know how you are feeling. Try not to isolate yourself from those who care about you. Remember, they will want to help you. Talk with someone every day, even if you do not feel sociable. Face-to-face conversation is best.  Call a mental health professional and see one regularly.  Visit your primary health care provider every  year.  Eat a well-balanced diet, and space your meals so you eat regularly.  Get plenty of rest.  Avoid alcohol and drugs, and remove them from your home. They will only make you feel worse.  If you are thinking of taking a lot of medicine, give your medicine to someone who can give it to you one day at a time. If you are on antidepressants and are concerned you will overdose, let your health care provider know so he or she can give you safer medicines. Ask your mental health professional about the possible side effects of any medicines you are taking.  Remove weapons, poisons, knives, and anything else that could harm you from your home.  Try to stick to routines. Follow a schedule every day. Put self-care on your schedule.  Make a list of realistic goals, and cross them off when you achieve them. Accomplishments give a sense of worth.  Wait until you are feeling better before doing the things you find difficult or unpleasant.  Exercise if you are able. You will feel better if you exercise for even a half hour each day.  Go out in the sun or into nature. This will help you recover from depression faster. If you have a favorite place to walk, go there.  Do the things that have always given you pleasure. Play your favorite music, read a good book, paint a picture, play your favorite instrument, or do anything else that takes your  mind off your depression if it is safe to do.  Keep your living space well lit.  When you are feeling well, write yourself a letter about tips and support that you can read when you are not feeling well.  Remember that life's difficulties can be sorted out with help. Conditions can be treated. You can work on thoughts and strategies that serve you well. This information is not intended to replace advice given to you by your health care provider. Make sure you discuss any questions you have with your health care provider. Document Released: 07/04/2002 Document  Revised: 08/27/2015 Document Reviewed: 04/24/2013 Elsevier Interactive Patient Education  2017 [employer]  IF you received an x-ray today, you will receive an invoice from [facility]. Please contact [facility] at [phone] with questions or concerns regarding your invoice.   IF you received labwork today, you will receive an invoice from [facility]. Please contact [facility] at [phone] with questions or concerns regarding your invoice.   Our billing staff will not be able to assist you with questions regarding bills from these companies.  You will be contacted with the lab results as soon as they are available. The fastest way to get your results is to activate your My Chart account. Instructions are located on the last page of this paperwork. If you have not heard from us regarding the results in 2 weeks, please contact this office.

## 2016-12-16 NOTE — Progress Notes (Deleted)
   Stacy Wang  MRN: [id number] DOB: [birth year]  Subjective:  Stacy Ceollyah Jorden is a 21 y.o. female seen in office today for a chief complaint of ***  Review of Systems  Patient Active Problem List   Diagnosis Date Noted  . Right-sided Bell's palsy 02/07/2014  . Shingles 02/07/2014    Current Outpatient Medications on File Prior to Visit  Medication Sig Dispense Refill  . busPIRone (BUSPAR) 5 MG tablet Take 5 mg by mouth 3 (three) times daily.    . fluticasone (FLONASE) 50 MCG/ACT nasal spray Place 2 sprays into both nostrils daily. 16 g 2  . montelukast (SINGULAIR) 10 MG tablet Take 1 tablet (10 mg total) by mouth at bedtime. 30 tablet 5   No current facility-administered medications on file prior to visit.     Allergies  Allergen Reactions  . Pollen Extract Itching     Objective:  There were no vitals taken for this visit.  Physical Exam  Assessment and Plan :  *** There are no diagnoses linked to this encounter.   Benjiman CoreBrittany Wiseman PA-C  Primary Care at [facility]  [facility] 12/16/2016 8:30 AM

## 2017-01-13 ENCOUNTER — Encounter: Payer: Self-pay | Admitting: Physician Assistant

## 2017-01-13 ENCOUNTER — Ambulatory Visit (INDEPENDENT_AMBULATORY_CARE_PROVIDER_SITE_OTHER): Payer: BC Managed Care – PPO | Admitting: Physician Assistant

## 2017-01-13 ENCOUNTER — Other Ambulatory Visit: Payer: Self-pay

## 2017-01-13 VITALS — BP 132/88 | HR 100 | Temp 97.7°F | Resp 18 | Ht 67.91 in | Wt 255.4 lb

## 2017-01-13 DIAGNOSIS — J0100 Acute maxillary sinusitis, unspecified: Secondary | ICD-10-CM | POA: Diagnosis not present

## 2017-01-13 DIAGNOSIS — J3489 Other specified disorders of nose and nasal sinuses: Secondary | ICD-10-CM | POA: Diagnosis not present

## 2017-01-13 DIAGNOSIS — R609 Edema, unspecified: Secondary | ICD-10-CM

## 2017-01-13 MED ORDER — AMOXICILLIN 875 MG PO TABS: 875.0000 mg | ORAL_TABLET | Freq: Two times a day (BID) | ORAL | 0 refills | Status: AC

## 2017-01-13 MED ORDER — PREDNISONE 10 MG PO TABS: ORAL_TABLET | ORAL | 0 refills | Status: DC

## 2017-01-13 NOTE — Progress Notes (Signed)
MRN: [id number] DOB: 09/29/1995  Subjective:   Stacy Wang is a 22 y.o. female presenting for chief complaint of Chest Congestion (X 5 days  pt states with fever); Nasal Congestion (X 5 days pt states with on and off headaches); and Depression (screening done) .  Reports 5 day history of worsening congestion. Has sinus pressure, sinus pain, nasal congestion, ear fullness, and now a postnasal drip that is causing her to cough. Feels really hot but has not had a documented fever. Has tried sudafed, ibuprofen, singulair, flonase consistently with no relief. Cannot sleep due to the pain in her face and not being able to breath. Denies fever, wheezing, shortness of breath, chest tightness and chest pain, nausea, vomiting, abdominal pain, diarrhea and confusion. Has had sick contact with little brother. Has history of seasonal allergies, no history of asthma. Patient has not had flu shot this season. Smokes vape.  Denies any other aggravating or relieving factors, no other questions or concerns.  Stacy Wang has a current medication list which includes the following prescription(s): buspirone, fluticasone, montelukast, amoxicillin, and prednisone. Also is allergic to pollen extract.  Stacy Wang  has a past medical history of Bell's palsy and Hypertension. Also  has a past surgical history that includes Wisdom tooth extraction.   Objective:   Vitals: BP 132/88 (BP Location: Left Arm, Patient Position: Sitting, Cuff Size: Large)   Pulse 100   Temp 97.7 F (36.5 C) (Oral)   Resp 18   Ht 5' 7.91" (1.725 m)   Wt 255 lb 6.4 oz (115.8 kg)   LMP 12/28/2016 (Approximate)   SpO2 99%   BMI 38.93 kg/m   Physical Exam  Constitutional: She is oriented to person, place, and time. She appears well-developed and well-nourished. She appears distressed (appears uncomfortable sitting on exam table).  HENT:  Head: Normocephalic and atraumatic.  Right Ear: External ear and ear canal normal. Tympanic membrane is  not erythematous and not bulging. A middle ear effusion is present.  Left Ear: External ear and ear canal normal. Tympanic membrane is not erythematous and not bulging. A middle ear effusion is present.  Nose: Mucosal edema (severe bilaterally) present. Right sinus exhibits maxillary sinus tenderness (with palpation and with leaning head forward). Right sinus exhibits no frontal sinus tenderness. Left sinus exhibits maxillary sinus tenderness (with palpation and with leaning head forward). Left sinus exhibits no frontal sinus tenderness.  Mouth/Throat: Uvula is midline, oropharynx is clear and moist and mucous membranes are normal. Tonsils are 2+ on the right. Tonsils are 2+ on the left. No tonsillar exudate.  Eyes: Conjunctivae are normal.  Neck: Normal range of motion.  Cardiovascular: Normal rate, regular rhythm and normal heart sounds.  Pulmonary/Chest: Effort normal and breath sounds normal. She has no wheezes. She has no rhonchi. She has no rales.  Lymphadenopathy:       Head (right side): No submental, no submandibular, no tonsillar, no preauricular, no posterior auricular and no occipital adenopathy present.       Head (left side): No submental, no submandibular, no tonsillar, no preauricular, no posterior auricular and no occipital adenopathy present.    She has no cervical adenopathy.       Right: No supraclavicular adenopathy present.       Left: No supraclavicular adenopathy present.  Neurological: She is alert and oriented to person, place, and time.  Skin: Skin is warm and dry.  Psychiatric: She has a normal mood and affect.  Vitals reviewed.   Assessment and Plan :  1. Sinus pressure 2. Mucosal edema 3. Acute non-recurrent maxillary sinusitis Due to hx, duration and worsening of symptoms, and PE findings, with no relief of symptoms with OTC medication, will treat for underlying bacterial etiology at this time. Also given Rx for short course of oral prednisone as patient's has  severe mucosal edema and has not had any relief with flonase. Given strict ED/return precautions.  - amoxicillin (AMOXIL) 875 MG tablet; Take 1 tablet (875 mg total) by mouth 2 (two) times daily for 7 days.  Dispense: 14 tablet; Refill: 0 - predniSONE (DELTASONE) 10 MG tablet; 4-3-2-1 taper. Take all the tablets for that day in the am with food.  Dispense: 10 tablet; Refill: 0  Benjiman CoreBrittany Wiseman, PA-C  Primary Care at [facility] 01/13/2017 2:33 PM

## 2017-01-13 NOTE — Patient Instructions (Addendum)
We are going to treat your underlying infection with an antibiotic and your underlying inflammation with oral prednisone. Prednisone is a steroid and can cause side effects such as headache, irritability, nausea, vomiting, increased heart rate, increased blood pressure, increased blood sugar, appetite changes, and insomnia. Please take tablets in the morning with a full meal to help decrease the chances of these side effects.   Return to clinic if symptoms worsen, do not improve, or as needed    Sinusitis, Adult Sinusitis is soreness and inflammation of your sinuses. Sinuses are hollow spaces in the bones around your face. They are located:  Around your eyes.  In the middle of your forehead.  Behind your nose.  In your cheekbones.  Your sinuses and nasal passages are lined with a stringy fluid (mucus). Mucus normally drains out of your sinuses. When your nasal tissues get inflamed or swollen, the mucus can get trapped or blocked so air cannot flow through your sinuses. This lets bacteria, viruses, and funguses grow, and that leads to infection. Follow these instructions at home: Medicines  Take, use, or apply over-the-counter and prescription medicines only as told by your doctor. These may include nasal sprays.  If you were prescribed an antibiotic medicine, take it as told by your doctor. Do not stop taking the antibiotic even if you start to feel better. Hydrate and Humidify  Drink enough water to keep your pee (urine) clear or pale yellow.  Use a cool mist humidifier to keep the humidity level in your home above 50%.  Breathe in steam for 10-15 minutes, 3-4 times a day or as told by your doctor. You can do this in the bathroom while a hot shower is running.  Try not to spend time in cool or dry air. Rest  Rest as much as possible.  Sleep with your head raised (elevated).  Make sure to get enough sleep each night. General instructions  Put a warm, moist washcloth on your  face 3-4 times a day or as told by your doctor. This will help with discomfort.  Wash your hands often with soap and water. If there is no soap and water, use hand sanitizer.  Do not smoke. Avoid being around people who are smoking (secondhand smoke).  Keep all follow-up visits as told by your doctor. This is important. Contact a doctor if:  You have a fever.  Your symptoms get worse.  Your symptoms do not get better within 10 days. Get help right away if:  You have a very bad headache.  You cannot stop throwing up (vomiting).  You have pain or swelling around your face or eyes.  You have trouble seeing.  You feel confused.  Your neck is stiff.  You have trouble breathing. This information is not intended to replace advice given to you by your health care provider. Make sure you discuss any questions you have with your health care provider. Document Released: 06/16/2007 Document Revised: 08/24/2015 Document Reviewed: 10/23/2014 Elsevier Interactive Patient Education  2018 [employer]   IF you received an x-ray today, you will receive an invoice from [facility]. Please contact [facility] at [phone] with questions or concerns regarding your invoice.   IF you received labwork today, you will receive an invoice from [facility]. Please contact [facility] at [phone] with questions or concerns regarding your invoice.   Our billing staff will not be able to assist you with questions regarding bills from these companies.  You will be contacted with the  lab results as soon as they are available. The fastest way to get your results is to activate your My Chart account. Instructions are located on the last page of this paperwork. If you have not heard from us regarding the results in 2 weeks, please contact this office.     

## 2017-01-19 DIAGNOSIS — I10 Essential (primary) hypertension: Secondary | ICD-10-CM | POA: Insufficient documentation

## 2017-03-19 ENCOUNTER — Ambulatory Visit: Payer: BC Managed Care – PPO | Admitting: Physician Assistant

## 2017-03-19 ENCOUNTER — Encounter: Payer: Self-pay | Admitting: Physician Assistant

## 2017-03-19 ENCOUNTER — Other Ambulatory Visit: Payer: Self-pay

## 2017-03-19 VITALS — BP 138/98 | HR 99 | Temp 98.7°F | Resp 16 | Ht 67.0 in | Wt 253.8 lb

## 2017-03-19 DIAGNOSIS — R251 Tremor, unspecified: Secondary | ICD-10-CM | POA: Diagnosis not present

## 2017-03-19 DIAGNOSIS — G4700 Insomnia, unspecified: Secondary | ICD-10-CM | POA: Diagnosis not present

## 2017-03-19 DIAGNOSIS — I10 Essential (primary) hypertension: Secondary | ICD-10-CM

## 2017-03-19 LAB — POC MICROSCOPIC URINALYSIS (UMFC): Mucus: ABSENT

## 2017-03-19 LAB — POCT URINALYSIS DIP (MANUAL ENTRY)
Bilirubin, UA: NEGATIVE
Glucose, UA: NEGATIVE mg/dL
Ketones, POC UA: NEGATIVE mg/dL
Nitrite, UA: NEGATIVE
Protein Ur, POC: NEGATIVE mg/dL
Spec Grav, UA: 1.015 (ref 1.010–1.025)
Urobilinogen, UA: 1 E.U./dL
pH, UA: 7 (ref 5.0–8.0)

## 2017-03-19 MED ORDER — HYDROXYZINE PAMOATE 100 MG PO CAPS: 100.0000 mg | ORAL_CAPSULE | Freq: Every day | ORAL | 0 refills | Status: DC

## 2017-03-19 NOTE — Progress Notes (Signed)
Stacy Wang  MRN: [id number] DOB: 1995-07-28  PCP: Stacy PengSanders, Robyn, MD  Subjective:  Pt is a 22 year old female PMH bipolar disorder who presents to clinic for several problems.   She c/o shakiness x 4 days. She stood up yesterday and "felt like I was going to pass out". She has eaten 3 times this week. She has slept 5 hour intervals twice this week. She cannot get to sleep.  HA - on the side of her right head. She is sensitive to light, blurry vision and seeing double.   Of note, her parents took her off all of her medications about 5-6 days ago (per pt) "Bc I was extremely tired, falling down, and hallucinating" She did not taper off meds, she just stopped them all. She was taking Trazodone, prozac and 1 other pill.  She is taking OCPs and blood pressure medications only at this time.  Her next appt with psychiatrist is March 25. She sees PA Recruitment consultantCarr.   Review of Systems  Constitutional: Positive for appetite change and fatigue. Negative for chills, diaphoresis and fever.  Eyes: Positive for photophobia.  Cardiovascular: Negative for chest pain and palpitations.  Gastrointestinal: Negative for abdominal pain, diarrhea, nausea and vomiting.  Neurological: Positive for dizziness and headaches. Negative for syncope and weakness.  Psychiatric/Behavioral: Positive for sleep disturbance. Negative for self-injury and suicidal ideas.    Patient Active Problem List   Diagnosis Date Noted  . Right-sided Bell's palsy 02/07/2014  . Shingles 02/07/2014  . Bipolar disorder (HCC) 01/12/2012    Current Outpatient Medications on File Prior to Visit  Medication Sig Dispense Refill  . amLODipine (NORVASC) 10 MG tablet Take 10 mg by mouth daily.    . fluticasone (FLONASE) 50 MCG/ACT nasal spray Place 2 sprays into both nostrils daily. 16 g 2  . Levonorgestrel-Ethinyl Estrad (VIENVA PO) Take by mouth.    . busPIRone (BUSPAR) 5 MG tablet Take 5 mg by mouth 3 (three) times daily.    . montelukast  (SINGULAIR) 10 MG tablet Take 1 tablet (10 mg total) by mouth at bedtime. (Patient not taking: Reported on 03/19/2017) 30 tablet 5   No current facility-administered medications on file prior to visit.     Allergies  Allergen Reactions  . Pollen Extract Itching     Objective:  BP (!) 138/98   Pulse 99   Temp 98.7 F (37.1 C) (Oral)   Resp 16   Ht 5\' 7"  (1.702 m)   Wt 253 lb 12.8 oz (115.1 kg)   LMP 02/25/2017   SpO2 98%   BMI 39.75 kg/m   Physical Exam  Constitutional: She is oriented to person, place, and time and well-developed, well-nourished, and in no distress. No distress.  obese  Cardiovascular: Normal rate, regular rhythm and normal heart sounds.  Neurological: She is alert and oriented to person, place, and time. GCS score is 15.  Skin: Skin is warm and dry.  Psychiatric: Memory, affect and judgment normal. Her mood appears anxious.  Vitals reviewed.   Results for orders placed or performed in visit on 03/19/17  POCT Microscopic Urinalysis ([facility])  Result Value Ref Range   WBC,UR,HPF,POC Few (A) None WBC/hpf   RBC,UR,HPF,POC Few (A) None RBC/hpf   Bacteria Few (A) None, Too numerous to count   Mucus Absent Absent   Epithelial Cells, UR Per Microscopy Moderate (A) None, Too numerous to count cells/hpf  POCT urinalysis dipstick  Result Value Ref Range   Color, UA yellow yellow  Clarity, UA clear clear   Glucose, UA negative negative mg/dL   Bilirubin, UA negative negative   Ketones, POC UA negative negative mg/dL   Spec Grav, UA 4.098 1.191 - 1.025   Blood, UA moderate (A) negative   pH, UA 7.0 5.0 - 8.0   Protein Ur, POC negative negative mg/dL   Urobilinogen, UA 1.0 0.2 or 1.0 E.U./dL   Nitrite, UA Negative Negative   Leukocytes, UA Trace (A) Negative    Assessment and Plan :  1. Shakiness 2. Insomnia, unspecified type - POCT Microscopic Urinalysis ([facility]) - Urine Culture - POCT urinalysis dipstick - CBC with Differential/Platelet - hydrOXYzine  (VISTARIL) 100 MG capsule; Take 1 capsule (100 mg total) by mouth at bedtime.  Dispense: 30 capsule; Refill: 0 - Pt c/o several symptoms I suspect is related to possible medication withdrawal, inadequate food intake and inadequate sleep. I am uncomfortable managing her medications as she was taking multiple medications, some of which she cannot recall, all of which are not on her medication list. Plan to start Hydroxyzine for anxiety symptoms. She denies SI or HI. Advised pt to contact her therapist about her situation. Discussed at length with pt need to eat several small meals daily, stay well hydrated, get daily exercise and sleep. OK to take trazodone for sleep. RTC next week for f/u.   Marco Collie, PA-C  Primary Care at [facility] 03/19/2017 10:50 AM

## 2017-03-19 NOTE — Patient Instructions (Addendum)
Start using mouth guard. You can buy this at you pharmacy.  Your urine looks like there is possibly a urinary tract infection. I am sending a urine culture out to the lab. If bacteria are present, I will call you to start an antibiotic.   We are checking your blood count - this will be resulted in the next 5-7 days.   I believe your shakiness, headache and other symptoms are due to medication withdrawal effects compounded by not eating or sleeping. It is very important to get proper sleep and adequate food intake. Eat several small healthy meals daily - even if you don't feel like eating.  Exercise will help. Go for a walk daily. Try to walk for at least 20 minutes.   Start taking Hydroxyzine for anxiety. (see below for info) Take 50-100mg  every 6 hours as needed for anxiety. You can pick this up at your pharmacy.  Take trazodone to help you sleep. Take 25-50mg  at bedtime.  Come back and see me next week.   Follow-up with your therapist. Please let their office know you are having effects from your medication. Be sure to keep your appointment later this month.   Hydroxyzine capsules or tablets What is this medicine? HYDROXYZINE (hye DROX i zeen) is an antihistamine. This medicine is used to treat allergy symptoms. It is also used to treat anxiety and tension. This medicine can be used with other medicines to induce sleep before surgery. This medicine may be used for other purposes; ask your health care provider or pharmacist if you have questions. COMMON BRAND NAME(S): ANX, Atarax, Rezine, Vistaril What should I tell my health care provider before I take this medicine? They need to know if you have any of these conditions: -any chronic illness -difficulty passing urine -glaucoma -heart disease -kidney disease -liver disease -lung disease -an unusual or allergic reaction to hydroxyzine, cetirizine, other medicines, foods, dyes, or preservatives -pregnant or trying to get  pregnant -breast-feeding How should I use this medicine? Take this medicine by mouth with a full glass of water. Follow the directions on the prescription label. You may take this medicine with food or on an empty stomach. Take your medicine at regular intervals. Do not take your medicine more often than directed. Talk to your pediatrician regarding the use of this medicine in children. Special care may be needed. While this drug may be prescribed for children as young as 34 years of age for selected conditions, precautions do apply. Patients over 40 years old may have a stronger reaction and need a smaller dose. Overdosage: If you think you have taken too much of this medicine contact a poison control center or emergency room at once. NOTE: This medicine is only for you. Do not share this medicine with others. What if I miss a dose? If you miss a dose, take it as soon as you can. If it is almost time for your next dose, take only that dose. Do not take double or extra doses. What may interact with this medicine? -alcohol -barbiturate medicines for sleep or seizures -medicines for colds, allergies -medicines for depression, anxiety, or emotional disturbances -medicines for pain -medicines for sleep -muscle relaxants This list may not describe all possible interactions. Give your health care provider a list of all the medicines, herbs, non-prescription drugs, or dietary supplements you use. Also tell them if you smoke, drink alcohol, or use illegal drugs. Some items may interact with your medicine. What should I watch for while using  this medicine? Tell your doctor or health care professional if your symptoms do not improve. You may get drowsy or dizzy. Do not drive, use machinery, or do anything that needs mental alertness until you know how this medicine affects you. Do not stand or sit up quickly, especially if you are an older patient. This reduces the risk of dizzy or fainting spells. Alcohol  may interfere with the effect of this medicine. Avoid alcoholic drinks. Your mouth may get dry. Chewing sugarless gum or sucking hard candy, and drinking plenty of water may help. Contact your doctor if the problem does not go away or is severe. This medicine may cause dry eyes and blurred vision. If you wear contact lenses you may feel some discomfort. Lubricating drops may help. See your eye doctor if the problem does not go away or is severe. If you are receiving skin tests for allergies, tell your doctor you are using this medicine. What side effects may I notice from receiving this medicine? Side effects that you should report to your doctor or health care professional as soon as possible: -fast or irregular heartbeat -difficulty passing urine -seizures -slurred speech or confusion -tremor Side effects that usually do not require medical attention (report to your doctor or health care professional if they continue or are bothersome): -constipation -drowsiness -fatigue -headache -stomach upset This list may not describe all possible side effects. Call your doctor for medical advice about side effects. You may report side effects to FDA at 1-800-FDA-1088. Where should I keep my medicine? Keep out of the reach of children. Store at room temperature between 15 and 30 degrees C (59 and 86 degrees F). Keep container tightly closed. Throw away any unused medicine after the expiration date. NOTE: This sheet is a summary. It may not cover all possible information. If you have questions about this medicine, talk to your doctor, pharmacist, or health care provider.  2018 Elsevier/Gold Standard (2007-05-12 14:50:59)  IF you received an x-ray today, you will receive an invoice from [facility]. Please contact [facility] at [phone] with questions or concerns regarding your invoice.   IF you received labwork today, you will receive an invoice from [facility]. Please contact  [facility] at [phone] with questions or concerns regarding your invoice.   Our billing staff will not be able to assist you with questions regarding bills from these companies.  You will be contacted with the lab results as soon as they are available. The fastest way to get your results is to activate your My Chart account. Instructions are located on the last page of this paperwork. If you have not heard from us regarding the results in 2 weeks, please contact this office.

## 2017-03-20 LAB — CBC WITH DIFFERENTIAL/PLATELET
Basophils Absolute: 0 10*3/uL (ref 0.0–0.2)
Basos: 0 %
EOS (ABSOLUTE): 0.1 10*3/uL (ref 0.0–0.4)
Eos: 1 %
Hematocrit: 38.6 % (ref 34.0–46.6)
Hemoglobin: 12.8 g/dL (ref 11.1–15.9)
Immature Grans (Abs): 0 10*3/uL (ref 0.0–0.1)
Immature Granulocytes: 0 %
Lymphocytes Absolute: 3 10*3/uL (ref 0.7–3.1)
Lymphs: 42 %
MCH: 26.9 pg (ref 26.6–33.0)
MCHC: 33.2 g/dL (ref 31.5–35.7)
MCV: 81 fL (ref 79–97)
Monocytes Absolute: 0.7 10*3/uL (ref 0.1–0.9)
Monocytes: 10 %
Neutrophils Absolute: 3.4 10*3/uL (ref 1.4–7.0)
Neutrophils: 47 %
Platelets: 388 10*3/uL — ABNORMAL HIGH (ref 150–379)
RBC: 4.76 x10E6/uL (ref 3.77–5.28)
RDW: 14.7 % (ref 12.3–15.4)
WBC: 7.2 10*3/uL (ref 3.4–10.8)

## 2017-03-20 LAB — URINE CULTURE: Organism ID, Bacteria: NO GROWTH

## 2017-03-26 ENCOUNTER — Ambulatory Visit: Payer: BC Managed Care – PPO | Admitting: Physician Assistant

## 2017-04-13 ENCOUNTER — Encounter: Payer: Self-pay | Admitting: Physician Assistant

## 2017-05-20 ENCOUNTER — Encounter (HOSPITAL_COMMUNITY): Payer: Self-pay

## 2017-05-20 ENCOUNTER — Inpatient Hospital Stay (HOSPITAL_COMMUNITY)
Admission: AD | Admit: 2017-05-20 | Discharge: 2017-05-26 | DRG: 885 | Disposition: A | Discharge status: Home / Self Care | Payer: BC Managed Care – PPO | Source: Intra-hospital | Attending: Psychiatry | Admitting: Psychiatry

## 2017-05-20 ENCOUNTER — Encounter (HOSPITAL_COMMUNITY): Payer: Self-pay | Admitting: Emergency Medicine

## 2017-05-20 ENCOUNTER — Emergency Department (HOSPITAL_COMMUNITY)
Admission: EM | Admit: 2017-05-20 | Discharge: 2017-05-20 | Disposition: A | Discharge status: Home / Self Care | Payer: BC Managed Care – PPO | Attending: Emergency Medicine | Admitting: Emergency Medicine

## 2017-05-20 DIAGNOSIS — I10 Essential (primary) hypertension: Secondary | ICD-10-CM | POA: Diagnosis present

## 2017-05-20 DIAGNOSIS — F419 Anxiety disorder, unspecified: Secondary | ICD-10-CM | POA: Diagnosis present

## 2017-05-20 DIAGNOSIS — Z23 Encounter for immunization: Secondary | ICD-10-CM | POA: Insufficient documentation

## 2017-05-20 DIAGNOSIS — Z79899 Other long term (current) drug therapy: Secondary | ICD-10-CM | POA: Insufficient documentation

## 2017-05-20 DIAGNOSIS — F17290 Nicotine dependence, other tobacco product, uncomplicated: Secondary | ICD-10-CM | POA: Diagnosis present

## 2017-05-20 DIAGNOSIS — F515 Nightmare disorder: Secondary | ICD-10-CM | POA: Diagnosis not present

## 2017-05-20 DIAGNOSIS — G510 Bell's palsy: Secondary | ICD-10-CM | POA: Insufficient documentation

## 2017-05-20 DIAGNOSIS — R45851 Suicidal ideations: Secondary | ICD-10-CM | POA: Diagnosis present

## 2017-05-20 DIAGNOSIS — Z638 Other specified problems related to primary support group: Secondary | ICD-10-CM | POA: Diagnosis not present

## 2017-05-20 DIAGNOSIS — F99 Mental disorder, not otherwise specified: Secondary | ICD-10-CM | POA: Diagnosis present

## 2017-05-20 DIAGNOSIS — Z915 Personal history of self-harm: Secondary | ICD-10-CM | POA: Diagnosis not present

## 2017-05-20 DIAGNOSIS — F4310 Post-traumatic stress disorder, unspecified: Secondary | ICD-10-CM | POA: Diagnosis present

## 2017-05-20 DIAGNOSIS — T1491XA Suicide attempt, initial encounter: Secondary | ICD-10-CM | POA: Diagnosis not present

## 2017-05-20 DIAGNOSIS — F332 Major depressive disorder, recurrent severe without psychotic features: Secondary | ICD-10-CM | POA: Diagnosis present

## 2017-05-20 DIAGNOSIS — Z91048 Other nonmedicinal substance allergy status: Secondary | ICD-10-CM

## 2017-05-20 DIAGNOSIS — Z818 Family history of other mental and behavioral disorders: Secondary | ICD-10-CM | POA: Diagnosis not present

## 2017-05-20 DIAGNOSIS — G4700 Insomnia, unspecified: Secondary | ICD-10-CM | POA: Diagnosis present

## 2017-05-20 DIAGNOSIS — F1099 Alcohol use, unspecified with unspecified alcohol-induced disorder: Secondary | ICD-10-CM | POA: Diagnosis not present

## 2017-05-20 DIAGNOSIS — X781XXA Intentional self-harm by knife, initial encounter: Secondary | ICD-10-CM | POA: Diagnosis not present

## 2017-05-20 LAB — CBC
HCT: 36.1 % (ref 36.0–46.0)
Hemoglobin: 12.1 g/dL (ref 12.0–15.0)
MCH: 27.2 pg (ref 26.0–34.0)
MCHC: 33.5 g/dL (ref 30.0–36.0)
MCV: 81.1 fL (ref 78.0–100.0)
PLATELETS: 351 10*3/uL (ref 150–400)
RBC: 4.45 MIL/uL (ref 3.87–5.11)
RDW: 14.5 % (ref 11.5–15.5)
WBC: 4.8 10*3/uL (ref 4.0–10.5)

## 2017-05-20 LAB — COMPREHENSIVE METABOLIC PANEL
ALT: 29 U/L (ref 14–54)
AST: 19 U/L (ref 15–41)
Albumin: 4 g/dL (ref 3.5–5.0)
Alkaline Phosphatase: 75 U/L (ref 38–126)
Anion gap: 12 (ref 5–15)
BILIRUBIN TOTAL: 0.3 mg/dL (ref 0.3–1.2)
BUN: 6 mg/dL (ref 6–20)
CALCIUM: 9.1 mg/dL (ref 8.9–10.3)
CO2: 21 mmol/L — ABNORMAL LOW (ref 22–32)
CREATININE: 0.81 mg/dL (ref 0.44–1.00)
Chloride: 107 mmol/L (ref 101–111)
Glucose, Bld: 85 mg/dL (ref 65–99)
Potassium: 3.9 mmol/L (ref 3.5–5.1)
Sodium: 140 mmol/L (ref 135–145)
TOTAL PROTEIN: 7.8 g/dL (ref 6.5–8.1)

## 2017-05-20 LAB — ACETAMINOPHEN LEVEL: Acetaminophen (Tylenol), Serum: <10 ug/mL — ABNORMAL LOW (ref 10–30)

## 2017-05-20 LAB — ETHANOL

## 2017-05-20 LAB — I-STAT BETA HCG BLOOD, ED (MC, WL, AP ONLY)

## 2017-05-20 LAB — SALICYLATE LEVEL

## 2017-05-20 MED ORDER — MAGNESIUM HYDROXIDE 400 MG/5ML PO SUSP: 30.0000 mL | Freq: Every day | ORAL | Status: DC | PRN

## 2017-05-20 MED ORDER — ONDANSETRON HCL 4 MG PO TABS: 4.0000 mg | ORAL_TABLET | Freq: Three times a day (TID) | ORAL | Status: DC | PRN

## 2017-05-20 MED ORDER — HYDROXYZINE HCL 25 MG PO TABS
25.0000 mg | ORAL_TABLET | Freq: Three times a day (TID) | ORAL | Status: DC | PRN
  Administered 2017-05-24: 25 mg via ORAL
  Filled 2017-05-20: qty 1

## 2017-05-20 MED ORDER — LEVONORGESTREL-ETHINYL ESTRAD 0.1-20 MG-MCG PO TABS: 1.0000 | ORAL_TABLET | Freq: Every day | ORAL | Status: DC

## 2017-05-20 MED ORDER — ZOLPIDEM TARTRATE 5 MG PO TABS: 5.0000 mg | ORAL_TABLET | Freq: Every evening | ORAL | Status: DC | PRN

## 2017-05-20 MED ORDER — CLONAZEPAM 1 MG PO TABS: 1.0000 mg | ORAL_TABLET | Freq: Three times a day (TID) | ORAL | Status: DC | PRN

## 2017-05-20 MED ORDER — AMLODIPINE BESYLATE 5 MG PO TABS
5.0000 mg | ORAL_TABLET | Freq: Every day | ORAL | Status: DC
  Administered 2017-05-20: 5 mg via ORAL
  Filled 2017-05-20: qty 1

## 2017-05-20 MED ORDER — FLUOXETINE HCL 20 MG PO CAPS
20.0000 mg | ORAL_CAPSULE | Freq: Every day | ORAL | Status: DC
  Administered 2017-05-20: 20 mg via ORAL
  Filled 2017-05-20: qty 1

## 2017-05-20 MED ORDER — TRAZODONE HCL 100 MG PO TABS
100.0000 mg | ORAL_TABLET | Freq: Every evening | ORAL | Status: DC | PRN
  Administered 2017-05-22 – 2017-05-25 (×2): 100 mg via ORAL
  Filled 2017-05-20 (×2): qty 1

## 2017-05-20 MED ORDER — ACETAMINOPHEN 325 MG PO TABS: 650.0000 mg | ORAL_TABLET | ORAL | Status: DC | PRN

## 2017-05-20 MED ORDER — ALUM & MAG HYDROXIDE-SIMETH 200-200-20 MG/5ML PO SUSP: 30.0000 mL | ORAL | Status: DC | PRN

## 2017-05-20 MED ORDER — HYDROXYZINE HCL 50 MG PO TABS
100.0000 mg | ORAL_TABLET | Freq: Every day | ORAL | Status: DC
  Filled 2017-05-20: qty 2

## 2017-05-20 MED ORDER — ALUM & MAG HYDROXIDE-SIMETH 200-200-20 MG/5ML PO SUSP: 30.0000 mL | Freq: Four times a day (QID) | ORAL | Status: DC | PRN

## 2017-05-20 MED ORDER — TRAZODONE HCL 50 MG PO TABS: 150.0000 mg | ORAL_TABLET | Freq: Every day | ORAL | Status: DC

## 2017-05-20 MED ORDER — ACETAMINOPHEN 325 MG PO TABS: 650.0000 mg | ORAL_TABLET | Freq: Four times a day (QID) | ORAL | Status: DC | PRN

## 2017-05-20 MED ORDER — TETANUS-DIPHTH-ACELL PERTUSSIS 5-2.5-18.5 LF-MCG/0.5 IM SUSP
0.5000 mL | Freq: Once | INTRAMUSCULAR | Status: AC
  Administered 2017-05-20: 0.5 mL via INTRAMUSCULAR
  Filled 2017-05-20: qty 0.5

## 2017-05-20 NOTE — ED Notes (Signed)
Pt A&O x 3, no distress noted, calm & cooperative, talking on phone at present.  Monitoring for safety, Q 15 min checks in effect.  Pending report to [facility] and transfer.

## 2017-05-20 NOTE — ED Notes (Signed)
Bed: [facility]2 Expected date:  Expected time:  Means of arrival:  Comments: 

## 2017-05-20 NOTE — ED Notes (Signed)
Patient presents with calm affect and mood is appropriate to situation.  Denies suicidal thoughts, auditory and visual hallucinations.  Medications given as prescribed.  Patient states, "I just want to get some sleep."  Routine safety checks maintained.  Patient is safe on the unit.

## 2017-05-20 NOTE — ED Notes (Signed)
Patient arrived to unit and is cooperative and pleasant with staff. Patient verbally agrees to remain safe and not hurt herself while she is on the unit. Pt states "I just had a very bad day at home". Pt did not elaborate on her reasons for being here. Sitter at the bedside for safety. No distress noted at this time.
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
## 2017-05-20 NOTE — Progress Notes (Signed)
VOL paperwork signed and faxed to [facility].  Riggin Cuttino, MSW, LCSW Therapeutic Triage Specialist  [phone]  
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
## 2017-05-20 NOTE — ED Notes (Signed)
Report given to [facility], [place]. Patient escorted to [facility] via security. Pt remains cooperative.

## 2017-05-20 NOTE — ED Notes (Signed)
Bed: WA27 Expected date:  Expected time:  Means of arrival:  Comments: 

## 2017-05-20 NOTE — ED Notes (Signed)
Report to RN [place], [facility] rm 400 hall.  Pending [facility] transport.

## 2017-05-20 NOTE — Progress Notes (Signed)
Accepted to [facility] [phone] per [facility], Inetta Fermo

## 2017-05-20 NOTE — ED Notes (Signed)
Provider in room to assess patient at this time.   

## 2017-05-21 ENCOUNTER — Other Ambulatory Visit: Payer: Self-pay

## 2017-05-21 DIAGNOSIS — Z638 Other specified problems related to primary support group: Secondary | ICD-10-CM

## 2017-05-21 DIAGNOSIS — Z915 Personal history of self-harm: Secondary | ICD-10-CM

## 2017-05-21 DIAGNOSIS — Z818 Family history of other mental and behavioral disorders: Secondary | ICD-10-CM

## 2017-05-21 DIAGNOSIS — F332 Major depressive disorder, recurrent severe without psychotic features: Principal | ICD-10-CM

## 2017-05-21 DIAGNOSIS — Z811 Family history of alcohol abuse and dependence: Secondary | ICD-10-CM

## 2017-05-21 DIAGNOSIS — F4310 Post-traumatic stress disorder, unspecified: Secondary | ICD-10-CM

## 2017-05-21 DIAGNOSIS — F17290 Nicotine dependence, other tobacco product, uncomplicated: Secondary | ICD-10-CM

## 2017-05-21 DIAGNOSIS — X781XXA Intentional self-harm by knife, initial encounter: Secondary | ICD-10-CM

## 2017-05-21 DIAGNOSIS — G4700 Insomnia, unspecified: Secondary | ICD-10-CM

## 2017-05-21 DIAGNOSIS — F515 Nightmare disorder: Secondary | ICD-10-CM

## 2017-05-21 DIAGNOSIS — T1491XA Suicide attempt, initial encounter: Secondary | ICD-10-CM

## 2017-05-21 DIAGNOSIS — F419 Anxiety disorder, unspecified: Secondary | ICD-10-CM

## 2017-05-21 DIAGNOSIS — F1099 Alcohol use, unspecified with unspecified alcohol-induced disorder: Secondary | ICD-10-CM

## 2017-05-21 LAB — HEMOGLOBIN A1C
Hgb A1c MFr Bld: 5.9 % — ABNORMAL HIGH (ref 4.8–5.6)
Mean Plasma Glucose: 122.63 mg/dL

## 2017-05-21 LAB — TSH: TSH: 0.711 u[IU]/mL (ref 0.350–4.500)

## 2017-05-21 LAB — LIPID PANEL
CHOL/HDL RATIO: 5.3 ratio
Cholesterol: 179 mg/dL (ref 0–200)
HDL: 34 mg/dL — ABNORMAL LOW (ref >40)
LDL Cholesterol: 123 mg/dL — ABNORMAL HIGH (ref 0–99)
Triglycerides: 108 mg/dL (ref <150)
VLDL: 22 mg/dL (ref 0–40)

## 2017-05-21 MED ORDER — FLUOXETINE HCL 20 MG PO CAPS
40.0000 mg | ORAL_CAPSULE | Freq: Every day | ORAL | Status: DC
  Administered 2017-05-21 – 2017-05-23 (×3): 40 mg via ORAL
  Filled 2017-05-21 (×9): qty 2

## 2017-05-21 MED ORDER — CLONAZEPAM 0.5 MG PO TABS: 0.5000 mg | ORAL_TABLET | Freq: Three times a day (TID) | ORAL | Status: DC | PRN

## 2017-05-21 MED ORDER — NICOTINE 21 MG/24HR TD PT24
21.0000 mg | MEDICATED_PATCH | Freq: Every day | TRANSDERMAL | Status: DC
  Administered 2017-05-21 – 2017-05-26 (×6): 21 mg via TRANSDERMAL
  Filled 2017-05-21 (×8): qty 1

## 2017-05-21 MED ORDER — MUPIROCIN CALCIUM 2 % EX CREA
TOPICAL_CREAM | Freq: Two times a day (BID) | CUTANEOUS | Status: DC
  Administered 2017-05-21 – 2017-05-26 (×5): via TOPICAL
  Filled 2017-05-21: qty 15
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
## 2017-05-21 NOTE — Tx Team (Signed)
Initial Treatment Plan 05/21/2017 2:08 AM Coral Ceo AVW:[id number]    PATIENT STRESSORS: Financial difficulties Marital or family conflict Medication change or noncompliance Occupational concerns   PATIENT STRENGTHS: Capable of independent living Communication skills Physical Health   PATIENT IDENTIFIED PROBLEMS: "I should feel better once I get back on my Prozac"  "I feel anxious here"  Depression  Risk for Suicide  Anxiety  Self Injury           DISCHARGE CRITERIA:  Ability to meet basic life and health needs Motivation to continue treatment in a less acute level of care Reduction of life-threatening or endangering symptoms to within safe limits Verbal commitment to aftercare and medication compliance  PRELIMINARY DISCHARGE PLAN: Return to previous living arrangement  PATIENT/FAMILY INVOLVEMENT: This treatment plan has been presented to and reviewed with the patient, Stacy Wang, and/or family member.  The patient and family have been given the opportunity to ask questions and make suggestions.  Eveline Keto, RN 05/21/2017, 2:08 AM

## 2017-05-21 NOTE — Progress Notes (Signed)
Patient ID: Stacy Wang, female   DOB: Dec 06, 1995, 22 y.o.   MRN: [id number]  Admission Note  Pt is a 22 y/o female admitted onto the 500 I/P adult unit on a voluntary basis. On admission, Pt appear to be anxious however; cooperative. "This is my first time admitted to a place like this." Pt admitted that she has been feeling increasingly depressed and sad for about a week but the conflict with mom triggered the self-harm feeling; "I have not cut in more than 3 months now but my mom accused me of stealing her money and was going to put me out." Pt endorsed moderate anxiety, depression and worrying. Pt denied SI/HI, AVH or pain at this time. Pt also denied recent alcohol use. Pt skin assessment showed recent superficial cuts on L. wrist and open skin tear on R. breast; "I pick on the wound on my breast when I'm stressed." Support, encouragement, and safe environment provided.  15-minute safety checks initiated and continued. Pt remained cooperative through the admission process. Pt remained alert and oriented through the admission process. Pt searched and no contrabands found. Pt went through several crying episodes during the admission process.

## 2017-05-21 NOTE — Progress Notes (Signed)
D. Pt resting in bed for much of the morning. Pt given topical antibiotic per MD order for open skin tear on right breast- no redness/swelling noted.  Pt presents with an anxious affect and behavior and currently denies SI/HI and AV hallucinations A. Labs and vitals monitored. Pt compliant with medications. Pt supported emotionally and encouraged to express concerns and ask questions.   R. Pt remains safe with 15 minute checks. Will continue POC.

## 2017-05-21 NOTE — BHH Group Notes (Signed)
Pt did not attend wrap up group this evening. Pt stayed in bed instead.  
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
## 2017-05-21 NOTE — Plan of Care (Signed)
  Problem: Medication: Goal: Compliance with prescribed medication regimen will improve Outcome: Progressing   

## 2017-05-21 NOTE — [facility] Group Notes (Signed)
[facility] Group Notes: (Clinical Social Work)   05/21/2017      Type of Therapy:  Group Therapy   Participation Level:  Did Not Attend despite MHT prompting   Sharlett Lienemann Grossman-Orr, LCSW 05/21/2017, 12:03 PM     

## 2017-05-22 MED ORDER — ARIPIPRAZOLE 2 MG PO TABS
2.0000 mg | ORAL_TABLET | Freq: Every day | ORAL | Status: DC
  Administered 2017-05-22 – 2017-05-23 (×2): 2 mg via ORAL
  Filled 2017-05-22 (×9): qty 1

## 2017-05-22 NOTE — Progress Notes (Addendum)
[facility] MD Progress Note  05/22/2017 4:13 PM Stacy Wang  MRN:  [id number] Subjective:  Patient reports she is feeling " a little better". Denies medication side effects thus far . Denies suicidal ideations at this time, and contracts for safety. Objective: I have reviewed chart notes and have met with patient. 22 year old single female, admitted due to worsening depression and suicidal ideations, at least partially triggered/exacerbated by argument with her parents, whom she states unfairly accused her of stealing money from them. Today reports feeling " a little better", but continues to present with a subdued , sad affect. Affect does become slightly more reactive during session and smiles briefly at times,but overall remains constricted and sad . Denies suicidal ideations.  No disruptive or agitated behaviors on unit, limited milieu participation, tends to isolate in room. Denies medication side effects thus far . Denies SI today.  Principal Problem: MDD, no psychotic symptoms Diagnosis:   Patient Active Problem List   Diagnosis Date Noted  . Major depressive disorder, recurrent severe without psychotic features (HCC) [F33.2] 05/20/2017  . MDD (major depressive disorder), recurrent episode, severe (HCC) [F33.2] 05/20/2017  . Right-sided Bell's palsy [G51.0] 02/07/2014  . Shingles [B02.9] 02/07/2014   Total Time spent with patient: 20 minutes  Past Medical History:  Past Medical History:  Diagnosis Date  . Bell's palsy   . Hypertension     Past Surgical History:  Procedure Laterality Date  . WISDOM TOOTH EXTRACTION     Family History:  Family History  Problem Relation Age of Onset  . Depression Mother   . Diabetes Father   . Heart disease Father    Social History:  Social History   Substance and Sexual Activity  Alcohol Use Yes  . Alcohol/week: 0.0 oz   Comment: 1/2 bottle liquor in one day for the week     Social History   Substance and Sexual Activity  Drug Use  No    Social History   Socioeconomic History  . Marital status: Single    Spouse name: Not on file  . Number of children: 0  . Years of education: Some colle  . Highest education level: Not on file  Occupational History  . 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 Financial resource strain: Not on file  . Food insecurity:    Worry: Not on file    Inability: Not on file  . Transportation needs:    Medical: Not on file    Non-medical: Not on file  Tobacco Use  . Smoking status: Light Tobacco Smoker    Packs/day: 0.25    Years: 2.00    Pack years: 0.50    Types: E-cigarettes  . Smokeless tobacco: Never Used  Substance and Sexual Activity  . Alcohol use: Yes    Alcohol/week: 0.0 oz    Comment: 1/2 bottle liquor in one day for the week  . Drug use: No  . Sexual activity: Not Currently  Lifestyle  . Physical activity:    Days per week: Not on file    Minutes per session: Not on file  . Stress: Not on file  Relationships  . Social connections:    Talks on phone: Not on file    Gets together: Not on file    Attends religious service: Not on file    Active member of club or organization: Not on file    Attends meetings of clubs or organizations: Not on file    Relationship   status: Not on file  Other Topics Concern  . Not on file  Social History Narrative   Right handed.   Lives at home with roommates.   Caffeine occasionally - not daily.   Additional Social History:    Pain Medications: See MAR Prescriptions: See MAR History of alcohol / drug use?: No history of alcohol / drug abuse Negative Consequences of Use: Financial, Personal relationships, Work / School  Sleep: Good  Appetite:  Fair  Current Medications: Current Facility-Administered Medications  Medication Dose Route Frequency Provider Last Rate Last Dose  . acetaminophen (TYLENOL) tablet 650 mg  650 mg Oral Q6H PRN Starkes, Takia S, FNP      . alum & mag hydroxide-simeth  (MAALOX/MYLANTA) 200-200-20 MG/5ML suspension 30 mL  30 mL Oral Q4H PRN Starkes, Takia S, FNP      . clonazePAM (KLONOPIN) tablet 0.5 mg  0.5 mg Oral TID PRN Vencent Hauschild A, MD      . FLUoxetine (PROZAC) capsule 40 mg  40 mg Oral Daily Juliano Mceachin A, MD   40 mg at 05/22/17 0825  . hydrOXYzine (ATARAX/VISTARIL) tablet 25 mg  25 mg Oral TID PRN Starkes, Takia S, FNP      . magnesium hydroxide (MILK OF MAGNESIA) suspension 30 mL  30 mL Oral Daily PRN Starkes, Takia S, FNP      . mupirocin cream (BACTROBAN) 2 %   Topical BID Starkes, Takia S, FNP      . nicotine (NICODERM CQ - dosed in mg/24 hours) patch 21 mg  21 mg Transdermal Daily Shaquille Murdy A, MD   21 mg at 05/22/17 0826  . traZODone (DESYREL) tablet 100 mg  100 mg Oral QHS PRN Starkes, Takia S, FNP        Lab Results:  Results for orders placed or performed during the hospital encounter of 05/20/17 (from the past 48 hour(s))  Hemoglobin A1c     Status: Abnormal   Collection Time: 05/21/17  6:48 AM  Result Value Ref Range   Hgb A1c MFr Bld 5.9 (H) 4.8 - 5.6 %    Comment: (NOTE) Pre diabetes:          5.7%-6.4% Diabetes:              >6.4% Glycemic control for   <7.0% adults with diabetes    Mean Plasma Glucose 122.63 mg/dL    Comment: Performed at [facility], [address] [place], [place] [address]  Lipid panel     Status: Abnormal   Collection Time: 05/21/17  6:48 AM  Result Value Ref Range   Cholesterol 179 0 - 200 mg/dL   Triglycerides 108 <150 mg/dL   HDL 34 (L) >40 mg/dL   Total CHOL/HDL Ratio 5.3 RATIO   VLDL 22 0 - 40 mg/dL   LDL Cholesterol 123 (H) 0 - 99 mg/dL    Comment:        Total Cholesterol/HDL:CHD Risk Coronary Heart Disease Risk Table                     Men   Women  1/2 Average Risk   3.4   3.3  Average Risk       5.0   4.4  2 X Average Risk   9.6   7.1  3 X Average Risk  23.4   11.0        Use the calculated Patient Ratio above and the CHD Risk Table to determine   the patient's CHD  Risk.        ATP III CLASSIFICATION (LDL):  <100     mg/dL   Optimal  100-129  mg/dL   Near or Above                    Optimal  130-159  mg/dL   Borderline  160-189  mg/dL   High  >190     mg/dL   Very High Performed at [facility] [address], [place], [place] [address]   TSH     Status: None   Collection Time: 05/21/17  6:48 AM  Result Value Ref Range   TSH 0.711 0.350 - 4.500 uIU/mL    Comment: Performed by a 3rd Generation assay with a functional sensitivity of <=0.01 uIU/mL. Performed at [facility], [place] [address] [place], [place] [address]     Blood Alcohol level:  Lab Results  Component Value Date   ETH <10 53/61/4431    Metabolic Disorder Labs: Lab Results  Component Value Date   HGBA1C 5.9 (H) 05/21/2017   MPG 122.63 05/21/2017   No results found for: PROLACTIN Lab Results  Component Value Date   CHOL 179 05/21/2017   TRIG 108 05/21/2017   HDL 34 (L) 05/21/2017   CHOLHDL 5.3 05/21/2017   VLDL 22 05/21/2017   LDLCALC 123 (H) 05/21/2017    Physical Findings: AIMS: Facial and Oral Movements Muscles of Facial Expression: None, normal Lips and Perioral Area: None, normal Jaw: None, normal Tongue: None, normal,Extremity Movements Upper (arms, wrists, hands, fingers): None, normal Lower (legs, knees, ankles, toes): None, normal, Trunk Movements Neck, shoulders, hips: None, normal, Overall Severity Severity of abnormal movements (highest score from questions above): None, normal Incapacitation due to abnormal movements: None, normal Patient's awareness of abnormal movements (rate only patient's report): No Awareness, Dental Status Current problems with teeth and/or dentures?: No Does patient usually wear dentures?: No  CIWA:    COWS:     Musculoskeletal: Strength & Muscle Tone: within normal limits Gait & Station: normal Patient leans: N/A  Psychiatric Specialty Exam: Physical Exam  ROS denies headache,  no chest pain , no shortness of breath, no vomiting , no fever or chills   Blood pressure 110/75, pulse 90, temperature 97.9 F (36.6 C), temperature source Oral, resp. rate 12, height 5' 6" (1.676 m), weight 108.9 kg (240 lb), last menstrual period 05/16/2017.Body mass index is 38.74 kg/m.  General Appearance: Fairly Groomed  Eye Contact:  Good  Speech:  Normal Rate  Volume:  Decreased  Mood:  Depressed, but states feeling slightly better today  Affect:  remains constricted, sad   Thought Process:  Linear and Descriptions of Associations: Intact  Orientation:  Full (Time, Place, and Person)  Thought Content:  denies hallucinations, no delusions, not internally preoccupied   Suicidal Thoughts:  No today denies suicidal or self injurious ideations, denies any homicidal or violent ideations  Homicidal Thoughts:  No  Memory:  recent and remote grossly intact   Judgement:  Other:  improving  Insight:  Fair  Psychomotor Activity:  Decreased  Concentration:  Concentration: Fair and Attention Span: Fair  Recall:  Good  Fund of Knowledge:  Good  Language:  Good  Akathisia:  Negative  Handed:  Right  AIMS (if indicated):     Assets:  Communication Skills Desire for Improvement Resilience  ADL's:  Intact  Cognition:  WNL  Sleep:  Number of Hours: 6.75  Assessment - 21 year old female, admitted for worsening depression, suicidal ideations, in the context of family stressors/tension with parents . Reports mood improving compared to admission , but continues to present with subdued , sad affect . Denies SI today. Thus far tolerating medications well .  Treatment Plan Summary: Daily contact with patient to assess and evaluate symptoms and progress in treatment, Medication management, Plan inpatient treatment and medications as below Encourage group and milieu participation to work on coping skills and symptom reduction Continue Prozac 40 mgr QDAY for depression Start Abilify 2 mgrs QDAY  for mood disorder, antidepressant augmentation Continue Klonopin 0.5 mgrs TID PRN for anxiety as needed  Continue Trazodone 100 mgrs QHS PRN for insomnia as needed  Treatment team working on disposition planning options  A , MD 05/22/2017, 4:13 PM 

## 2017-05-22 NOTE — Progress Notes (Signed)
D. Pt presents with a sad affect and congruent mood. Pt lying in bed awake upon initial approach- reports having slept poorly last night. Per pt's self inventory, pt rates her depression, hopelessness and anxiety a 7/2/0, respectively. Pt writes that her most important goal today is "going home".Pt currently denies SI/HI and AV hallucinations. A. Labs and vitals monitored. Pt compliant with medications. Pt supported emotionally and encouraged to express concerns and ask questions.   R. Pt remains safe with 15 minute checks. Will continue POC.

## 2017-05-22 NOTE — Progress Notes (Signed)
Patient ID: Stacy Wang, female   DOB: 02/20/1995, 22 y.o.   MRN: [id number] DAR Note: Pt with sad/depressed affect continue to isolate self to her room; remained in bed all evening. Pt endorsed moderate anxiety, depression, helplessness and hopelessness; "I don't know if there is anything that can be done for me here." Pt denied SI/HI, AVH or pain. Pt contracts for safety. Medications offered as prescribed. All patient's questions and concerns addressed. Support, encouragement, and safe environment provided. 15-minute safety checks continue. Pt was med compliant. Pt did not attend wrap-up group.

## 2017-05-22 NOTE — [facility] Group Notes (Signed)
[facility] Group Notes: (Clinical Social Work)   05/22/2017      Type of Therapy:  Group Therapy   Participation Level:  Did Not Attend despite MHT prompting   Ambrose Mantle, LCSW 05/22/2017, 12:57 PM

## 2017-05-23 MED ORDER — CLONAZEPAM 0.5 MG PO TABS: 0.5000 mg | ORAL_TABLET | Freq: Two times a day (BID) | ORAL | Status: DC | PRN

## 2017-05-23 NOTE — [facility] Group Notes (Signed)
LCSW Group Therapy Note 05/23/2017 1:07 PM  Type of Therapy and Topic: Group Therapy: Overcoming Obstacles  Participation Level: Did Not Attend  Description of Group:  In this group patients will be encouraged to explore what they see as obstacles to their own wellness and recovery. They will be guided to discuss their thoughts, feelings, and behaviors related to these obstacles. The group will process together ways to cope with barriers, with attention given to specific choices patients can make. Each patient will be challenged to identify changes they are motivated to make in order to overcome their obstacles. This group will be process-oriented, with patients participating in exploration of their own experiences as well as giving and receiving support and challenge from other group members.  Therapeutic Goals: 1. Patient will identify personal and current obstacles as they relate to admission. 2. Patient will identify barriers that currently interfere with their wellness or overcoming obstacles.  3. Patient will identify feelings, thought process and behaviors related to these barriers. 4. Patient will identify two changes they are willing to make to overcome these obstacles:   Summary of Patient Progress  Invited, chose not to attend.    Therapeutic Modalities:  Cognitive Behavioral Therapy Solution Focused Therapy Motivational Interviewing Relapse Prevention Therapy   Alcario Drought Clinical Social Worker

## 2017-05-23 NOTE — Progress Notes (Signed)
Patient ID: Stacy Wang, female   DOB: 1995/12/05, 22 y.o.   MRN: [id number]  DAR Note: Pt observed in the dayroom not interacting. Pt continue to endorse moderate anxiety, depression and worrying; "my parent said I can only come home if I stop taking all my psych medications." Pt denied SI/HI, AVH or pain. Pt contracts for safety. Medications offered as prescribed. All patient's questions and concerns addressed. Support, encouragement, and safe environment provided. 15-minute safety checks continue. Pt was med compliant. Pt attended wrap-up group.

## 2017-05-23 NOTE — Tx Team (Signed)
Interdisciplinary Treatment and Diagnostic Plan Update  05/23/2017 Time of Session: 9:40am Stacy Wang MRN: [id number]  Principal Diagnosis: <principal problem not specified>  Secondary Diagnoses: Active Problems:   MDD (major depressive disorder), recurrent episode, severe (HCC)   Current Medications:  Current Facility-Administered Medications  Medication Dose Route Frequency Provider Last Rate Last Dose  . acetaminophen (TYLENOL) tablet 650 mg  650 mg Oral Q6H PRN Nanci Pina, FNP      . alum & mag hydroxide-simeth (MAALOX/MYLANTA) 200-200-20 MG/5ML suspension 30 mL  30 mL Oral Q4H PRN Nanci Pina, FNP      . ARIPiprazole (ABILIFY) tablet 2 mg  2 mg Oral Daily Cobos, Myer Peer, MD   2 mg at 05/23/17 0818  . clonazePAM (KLONOPIN) tablet 0.5 mg  0.5 mg Oral TID PRN Cobos, Myer Peer, MD      . FLUoxetine (PROZAC) capsule 40 mg  40 mg Oral Daily Cobos, Myer Peer, MD   40 mg at 05/23/17 0818  . hydrOXYzine (ATARAX/VISTARIL) tablet 25 mg  25 mg Oral TID PRN Nanci Pina, FNP      . magnesium hydroxide (MILK OF MAGNESIA) suspension 30 mL  30 mL Oral Daily PRN Starkes, Takia S, FNP      . mupirocin cream (BACTROBAN) 2 %   Topical BID Starkes, Takia S, FNP      . nicotine (NICODERM CQ - dosed in mg/24 hours) patch 21 mg  21 mg Transdermal Daily Cobos, Myer Peer, MD   21 mg at 05/23/17 9983  . traZODone (DESYREL) tablet 100 mg  100 mg Oral QHS PRN Nanci Pina, FNP   100 mg at 05/22/17 2123   PTA Medications: Medications Prior to Admission  Medication Sig Dispense Refill Last Dose  . amLODipine (NORVASC) 5 MG tablet Take 5 mg by mouth daily.  0 05/19/2017 at Unknown time  . clonazePAM (KLONOPIN) 1 MG tablet TAKE 1 TABLET BY MOUTH THREE TIMES A DAY IF NEEDED FOR ANXIETY OR PANIC DISORDER  0 05/19/2017 at Unknown time  . FLUoxetine (PROZAC) 20 MG capsule Take 20 mg by mouth daily.  4 Past Week at Unknown time  . traZODone (DESYREL) 50 MG tablet TAKE 3 TABLETS BY MOUTH AT  BEDTIME  4 05/19/2017 at Unknown time  . VIENVA 0.1-20 MG-MCG tablet TK 1 T PO ONCE D  2 05/19/2017 at Unknown time  . hydrOXYzine (VISTARIL) 100 MG capsule Take 1 capsule (100 mg total) by mouth at bedtime. 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   Physician Treatment Plan for Primary Diagnosis: <principal problem not specified> Long Term Goal(s): Improvement in symptoms so as ready for discharge Improvement in symptoms so as ready for discharge   Short Term Goals: Ability to identify changes in lifestyle to reduce recurrence of condition will improve Ability to maintain clinical measurements within normal limits will improve Ability to identify changes in lifestyle to reduce recurrence of condition will improve Ability to maintain clinical measurements within normal limits will improve  Medication Management: Evaluate patient's response, side effects, and tolerance of medication regimen.  Therapeutic Interventions: 1 to 1 sessions, Unit Group sessions and Medication administration.  Evaluation of Outcomes: Not Met  Physician Treatment  Plan for Secondary Diagnosis: Active Problems:   MDD (major depressive disorder), recurrent episode, severe ([place])  Long Term Goal(s): Improvement in symptoms so as ready for discharge Improvement in symptoms so as ready for discharge   Short Term Goals: Ability to identify changes in lifestyle to reduce recurrence of condition will improve Ability to maintain clinical measurements within normal limits will improve Ability to identify changes in lifestyle to reduce recurrence of  condition will improve Ability to maintain clinical measurements within normal limits will improve     Medication Management: Evaluate patient's response, side effects, and tolerance of medication regimen.  Therapeutic Interventions: 1 to 1 sessions, Unit Group sessions and Medication administration.  Evaluation of Outcomes: Not Met   RN Treatment Plan for Primary Diagnosis: <principal problem not specified> Long Term Goal(s): Knowledge of disease and therapeutic regimen to maintain health will improve  Short Term Goals: Ability to remain free from injury will improve, Ability to verbalize frustration and anger appropriately will improve, Ability to demonstrate self-control, Ability to participate in decision making will improve, Ability to verbalize feelings will improve, Ability to disclose and discuss suicidal ideas, Ability to identify and develop effective coping behaviors will improve and Compliance with prescribed medications will improve  Medication Management: RN will administer medications as ordered by provider, will assess and evaluate patient's response and provide education to patient for prescribed medication. RN will report any adverse and/or side effects to prescribing provider.  Therapeutic Interventions: 1 on 1 counseling sessions, Psychoeducation, Medication administration, Evaluate responses to treatment, Monitor vital signs and CBGs as ordered, Perform/monitor CIWA, COWS, AIMS and Fall Risk screenings as ordered, Perform wound care treatments as ordered.  Evaluation of Outcomes: Not Met   LCSW Treatment Plan for Primary Diagnosis: <principal problem not specified> Long Term Goal(s): Safe transition to appropriate next level of care at discharge, Engage patient in therapeutic group addressing interpersonal concerns.  Short Term Goals: Engage patient in aftercare planning with referrals and resources, Increase social support, Increase ability to appropriately verbalize  feelings, Increase emotional regulation, Facilitate acceptance of mental health diagnosis and concerns, Facilitate patient progression through stages of change regarding substance use diagnoses and concerns, Identify triggers associated with mental health/substance abuse issues and Increase skills for wellness and recovery  Therapeutic Interventions: Assess for all discharge needs, 1 to 1 time with Social worker, Explore available resources and support systems, Assess for adequacy in community support network, Educate family and significant other(s) on suicide prevention, Complete Psychosocial Assessment, Interpersonal group therapy.  Evaluation of Outcomes: Not Met   Progress in Treatment: Attending groups: Yes. Participating in groups: Yes. Taking medication as prescribed: Yes. Toleration medication: Yes. Family/Significant other contact made: No, will contact:  the patient's parents Patient understands diagnosis: Yes. Discussing patient identified problems/goals with staff: Yes. Medical problems stabilized or resolved: Yes.  Denies suicidal/homicidal ideation: Yes. Issues/concerns per patient self-inventory: No. Other:  New problem(s) identified: None   New Short Term/Long Term Goal(s):medication stabilization, elimination of SI thoughts, development of comprehensive mental wellness plan.   Patient Goals:" stop the suicidal thoughtsand get back to some normalcy"  Discharge Plan or Barriers: Patient plans to return home and continue to follow up with Dr. Toy Care for medication management and [facility] for therapy services.   Reason for Continuation of Hospitalization: Depression Medication stabilization Suicidal ideation  Estimated Length of Stay: 3/5 days  Attendees: Patient: Stacy Wang 05/23/2017 8:38 AM  Physician: Dr. Myles Lipps, MD 05/23/2017 8:38 AM  Nursing: Benjamine Mola, RN 05/23/2017 8:38  AM  RN Care Manager: X 05/23/2017 8:38 AM  Social Worker: Radonna Ricker, [place] 05/23/2017 8:38 AM  Recreational Therapist: X 05/23/2017 8:38 AM  Other: X 05/23/2017 8:38 AM  Other: X 05/23/2017 8:38 AM  Other:X 05/23/2017 8:38 AM    Scribe for Treatment Team: Marylee Floras, [place] 05/23/2017 8:38 AM

## 2017-05-23 NOTE — [facility] Group Notes (Signed)
[facility] Group Notes:  (Nursing/MHT/Case Management/Adjunct)  Date:  05/22/17 Time:  8:20pm  Type of Therapy:  Wrap Up Group  Participation Level:  Active  Participation Quality:  Sharing  Affect:  Depressed and Flat  Cognitive:  Appropriate  Insight:  Improving  Engagement in Group:  Engaged  Modes of Intervention:  Discussion and Support  Summary of Progress/Problems:  Brigida reported that she was admitted due to having an argument with her parent.  "They don't understand depression and feel like I need to go of medications."  She reported she was able to meet her goal of trying to get out of her room.  Nizar Cutler V Kimbria Camposano 05/23/2017, 12:12 AM

## 2017-05-23 NOTE — Progress Notes (Signed)
Psychoeducational Group Note  Date:  05/23/2017 Time:  1359  Group Topic/Focus:  Dimensions of Wellness:   The focus of this group is to introduce the topic of wellness and discuss the role each dimension of wellness plays in total health.  Participation Level: Did Not Attend  Participation Quality:  Not Applicable  Affect:  Not Applicable  Cognitive:  Not Applicable  Insight:  Not Applicable  Engagement in Group: Not Applicable  Additional Comments:  Pt was asleep and could not attend group.  Raylynne Cubbage E 05/23/2017, 1:59 PM

## 2017-05-23 NOTE — [facility] Group Notes (Signed)
Pt did not attend wrap up group this evening. Pt was asleep in bed.  

## 2017-05-23 NOTE — [facility] Suicide Risk Assessment (Signed)
[facility] INPATIENT:  Family/Significant Other Suicide Prevention Education  Suicide Prevention Education:  Contact Attempts:[name] [phone]) has been identified by the patient as the family member/significant other with whom the patient will be residing, and identified as the person(s) who will aid the patient in the event of a mental health crisis.  With written consent from the patient, two attempts were made to provide suicide prevention education, prior to and/or following the patient's discharge.  We were unsuccessful in providing suicide prevention education.  A suicide education pamphlet was given to the patient to share with family/significant other.  Date and time of first attempt:  05/23/2017 / 2:43pm   Stacy Wang 05/23/2017, 2:43 PM

## 2017-05-23 NOTE — Plan of Care (Signed)
  Problem: Medication: Goal: Compliance with prescribed medication regimen will improve Outcome: Progressing   Problem: Health Behavior/Discharge Planning: Goal: Ability to remain free from injury will improve Outcome: Progressing DAR NOTE: Patient presents with flat affect and depressed mood.  Denies suicidal thoughts, pain, auditory and visual hallucinations.  Rates depression at 0, hopelessness at 0, and anxiety at 0.  Maintained on routine safety checks.  Medications given as prescribed.  Support and encouragement offered as needed.    States goal for today is "to stop psych medications before discharge."  Patient spent majority this shift in bed sleeping.  Encouraged to be visible in milieu for therapy and activities.  Offered no complaint.

## 2017-05-23 NOTE — Progress Notes (Signed)
[facility] MD Progress Note  05/23/2017 9:51 AM Stacy Wang  MRN:  [id number] Subjective: Patient is seen and examined.  Patient is a 22 year old female who was admitted on 05/20/2017 due to worsening depression and suicidal ideation.  She had had an argument with her parents, and she felt as though she was being unfairly accused of stealing money from them.  There was also concern from the family about her medications.  Nursing reported this morning that the family prefers that she is not on any medication per her report.  There is some issue about her returning home if she is on medications.  The patient and I discussed that today.  She reports doing better.  She denied any suicidal ideation.  She stated she is basically catching up on sleep which she had not gotten at home.  She stated her mood was doing a little bit better, and that she was not having any side effects to current medications.  She was placed on the Prozac as well as Abilify on admission.  She continues on trazodone.  Her Klonopin is as needed, and she is not requested that since she has been here. Principal Problem: <principal problem not specified> Diagnosis:   Patient Active Problem List   Diagnosis Date Noted  . Major depressive disorder, recurrent severe without psychotic features (HCC) [F33.2] 05/20/2017  . MDD (major depressive disorder), recurrent episode, severe (HCC) [F33.2] 05/20/2017  . Right-sided Bell's palsy [G51.0] 02/07/2014  . Shingles [B02.9] 02/07/2014   Total Time spent with patient: 20 minutes  Past Psychiatric History: See admission H&P  Past Medical History:  Past Medical History:  Diagnosis Date  . Bell's palsy   . Hypertension     Past Surgical History:  Procedure Laterality Date  . WISDOM TOOTH EXTRACTION     Family History:  Family History  Problem Relation Age of Onset  . Depression Mother   . Diabetes Father   . Heart disease Father    Family Psychiatric  History: See admission  H&P Social History:  Social History   Substance and Sexual Activity  Alcohol Use Yes  . Alcohol/week: 0.0 oz   Comment: 1/2 bottle liquor in one day for the week     Social History   Substance and Sexual Activity  Drug Use No    Social History   Socioeconomic History  . Marital status: Single    Spouse name: Not on file  . Number of children: 0  . Years of education: Some colle  . Highest education level: Not on file  Occupational History  . Occupation: Personal assistant - Clinical biochemist  Social Needs  . Financial resource strain: Not on file  . Food insecurity:    Worry: Not on file    Inability: Not on file  . Transportation needs:    Medical: Not on file    Non-medical: Not on file  Tobacco Use  . Smoking status: Light Tobacco Smoker    Packs/day: 0.25    Years: 2.00    Pack years: 0.50    Types: E-cigarettes  . Smokeless tobacco: Never Used  Substance and Sexual Activity  . Alcohol use: Yes    Alcohol/week: 0.0 oz    Comment: 1/2 bottle liquor in one day for the week  . Drug use: No  . Sexual activity: Not Currently  Lifestyle  . Physical activity:    Days per week: Not on file    Minutes per session: Not on file  . Stress: Not  on file  Relationships  . Social connections:    Talks on phone: Not on file    Gets together: Not on file    Attends religious service: Not on file    Active member of club or organization: Not on file    Attends meetings of clubs or organizations: Not on file    Relationship status: Not on file  Other Topics Concern  . Not on file  Social History Narrative   Right handed.   Lives at home with roommates.   Caffeine occasionally - not daily.   Additional Social History:    Pain Medications: See MAR Prescriptions: See MAR History of alcohol / drug use?: No history of alcohol / drug abuse Negative Consequences of Use: Financial, Personal relationships, Work / School                    Sleep: Fair  Appetite:   Fair  Current Medications: Current Facility-Administered Medications  Medication Dose Route Frequency Provider Last Rate Last Dose  . acetaminophen (TYLENOL) tablet 650 mg  650 mg Oral Q6H PRN Truman Hayward, FNP      . alum & mag hydroxide-simeth (MAALOX/MYLANTA) 200-200-20 MG/5ML suspension 30 mL  30 mL Oral Q4H PRN Truman Hayward, FNP      . ARIPiprazole (ABILIFY) tablet 2 mg  2 mg Oral Daily Cobos, Rockey Situ, MD   2 mg at 05/23/17 0818  . clonazePAM (KLONOPIN) tablet 0.5 mg  0.5 mg Oral TID PRN Cobos, Rockey Situ, MD      . FLUoxetine (PROZAC) capsule 40 mg  40 mg Oral Daily Cobos, Rockey Situ, MD   40 mg at 05/23/17 0818  . hydrOXYzine (ATARAX/VISTARIL) tablet 25 mg  25 mg Oral TID PRN Truman Hayward, FNP      . magnesium hydroxide (MILK OF MAGNESIA) suspension 30 mL  30 mL Oral Daily PRN Starkes, Takia S, FNP      . mupirocin cream (BACTROBAN) 2 %   Topical BID Starkes, Takia S, FNP      . nicotine (NICODERM CQ - dosed in mg/24 hours) patch 21 mg  21 mg Transdermal Daily Cobos, Rockey Situ, MD   21 mg at 05/23/17 1610  . traZODone (DESYREL) tablet 100 mg  100 mg Oral QHS PRN Truman Hayward, FNP   100 mg at 05/22/17 2123    Lab Results: No results found for this or any previous visit (from the past 48 hour(s)).  Blood Alcohol level:  Lab Results  Component Value Date   ETH <10 05/20/2017    Metabolic Disorder Labs: Lab Results  Component Value Date   HGBA1C 5.9 (H) 05/21/2017   MPG 122.63 05/21/2017   No results found for: PROLACTIN Lab Results  Component Value Date   CHOL 179 05/21/2017   TRIG 108 05/21/2017   HDL 34 (L) 05/21/2017   CHOLHDL 5.3 05/21/2017   VLDL 22 05/21/2017   LDLCALC 123 (H) 05/21/2017    Physical Findings: AIMS: Facial and Oral Movements Muscles of Facial Expression: None, normal Lips and Perioral Area: None, normal Jaw: None, normal Tongue: None, normal,Extremity Movements Upper (arms, wrists, hands, fingers): None, normal Lower  (legs, knees, ankles, toes): None, normal, Trunk Movements Neck, shoulders, hips: None, normal, Overall Severity Severity of abnormal movements (highest score from questions above): None, normal Incapacitation due to abnormal movements: None, normal Patient's awareness of abnormal movements (rate only patient's report): No Awareness, Dental Status Current problems with teeth and/or  dentures?: No Does patient usually wear dentures?: No  CIWA:    COWS:     Musculoskeletal: Strength & Muscle Tone: within normal limits Gait & Station: normal Patient leans: N/A  Psychiatric Specialty Exam: Physical Exam  Nursing note and vitals reviewed. Constitutional: She is oriented to person, place, and time. She appears well-developed and well-nourished.  HENT:  Head: Normocephalic and atraumatic.  Respiratory: Effort normal.  Musculoskeletal: Normal range of motion.  Neurological: She is alert and oriented to person, place, and time.    ROS  Blood pressure 123/87, pulse 77, temperature 98.4 F (36.9 C), temperature source Oral, resp. rate 16, height  (1.676 m), weight 108.9 kg (240 lb), last menstrual period 05/16/2017.Body mass index is 38.74 kg/m.  General Appearance: Casual  Eye Contact:  Fair  Speech:  Normal Rate  Volume:  Normal  Mood:  Euthymic  Affect:  Congruent  Thought Process:  Coherent  Orientation:  Full (Time, Place, and Person)  Thought Content:  Logical  Suicidal Thoughts:  No  Homicidal Thoughts:  No  Memory:  Immediate;   Fair  Judgement:  Intact  Insight:  Fair  Psychomotor Activity:  Normal  Concentration:  Concentration: Fair  Recall:  [employer] of Knowledge:  Fair  Language:  Good  Akathisia:  Negative  Handed:  Right  AIMS (if indicated):     Assets:  Communication Skills Desire for Improvement Housing Physical Health Resilience  ADL's:  Intact  Cognition:  WNL  Sleep:  Number of Hours: 6.75     Treatment Plan Summary: Daily contact with  patient to assess and evaluate symptoms and progress in treatment, Medication management and Plan Patient is seen and examined.  Patient is a 22 year old female with the above-stated past psychiatric history seen in follow-up.  She stated her mood and anxiety were improving.  She has not required Klonopin since she is been in the hospital.  She is catching up on sleep, and seems to be sleeping well with the Abilify and Prozac.  I am not going to change her medications today.  She needs to continue to be observed.  I think we probably have to arrange some kind of meeting with social work, physician and the patient to discuss the need for medications in this patient.  Getting across them the fact that without these medication she may become suicidal, more depressed and be at risk to herself.  I am going to reduce her Klonopin as needed to twice daily.  She is not taking it anyway, and I am hoping that by eliminating this medication the family will be more open to allowing her to take the other medications.  Antonieta Pert, MD 05/23/2017, 9:51 AM

## 2017-05-24 LAB — GC/CHLAMYDIA PROBE AMP (~~LOC~~) NOT AT ARMC
CHLAMYDIA, DNA PROBE: NEGATIVE
Neisseria Gonorrhea: NEGATIVE
TRICH (WINDOWPATH): NEGATIVE

## 2017-05-24 NOTE — Progress Notes (Signed)
Recreation Therapy Notes  Animal-Assisted Activity (AAA) Program Checklist/Progress Notes Patient Eligibility Criteria Checklist & Daily Group note for Rec Tx Intervention  Date: 5.14.19 Time: 1430 Location: [address] Dayroom   AAA/T Program Assumption of Risk Form signed by Patient/ or Parent Legal Guardian YES   Patient is free of allergies or sever asthma NO  Patient reports no fear of animals YES   Patient reports no history of cruelty to animals YES   Patient understands his/her participation is voluntary YES   Patient washes hands before animal contact YES   Patient washes hands after animal contact YES   Behavioral Response: Engaged  Education: Hand Washing, Appropriate Animal Interaction   Education Outcome: Acknowledges understanding/In group clarification offered/Needs additional education.   Clinical Observations/Feedback: Pt attended and participated in activity.    Stacy Wang, LRT/CTRS         Stacy Wang A 05/24/2017 3:37 PM 

## 2017-05-24 NOTE — Progress Notes (Signed)
D: pt found in bed. Pt stated that she didn't want any of her meds except for her nicotine patch. Pt stated her parents want her to stop taking her psych meds in order for her to return home. Pt stated she is ok with that. Pt pleasant and cooperative. Pt denies any si/hi. Pt rates her depression/hopelessness/anxiety a 4/7/6 respectively.  A: nicotine patch applied. Education given about med compliance. Support and encouragement given. q62m checks implemented and continued. R: pt contracts for safety. Will continue to monitor.

## 2017-05-24 NOTE — [facility] Group Notes (Signed)
[facility] Group Therapy      05/24/2017 2:05 PM  Type of Therapy: Mental Health [facility] Presentation  Participation Level:  DID NOT ATTEND   Summary of Progress/Problems:   INVITED, CHOSE NOT TO ATTEND.   Alcario Drought Clinical Social Worker

## 2017-05-24 NOTE — Progress Notes (Signed)
[facility] MD Progress Note  05/24/2017 11:44 AM Stacy Wang  MRN:  [id number] Subjective: Patient reports partially improved mood, denies suicidal ideations, does endorse some lingering depression. Objective: I have reviewed chart notes and have met with patient. 22 year old single female, admitted due to worsening depression and suicidal ideations,  partially triggered/exacerbated by argument with her parents, whom she states unfairly accused her of stealing money from them. Today reports some improvement compared to how she felt prior to admission, denies suicidal ideations.  She does however acknowledge lingering depression, affect is constricted but improved partially during session and does smile at times appropriately. We discussed medication issues-patient states that her mother has concerns about her being on psychiatric medication, states she thinks" I am not dealing with my problems because I am on the medication, kind of like I am hiding behind the medication to not face things".  She states that mother has told her she can return home, but has placed some conditions such as getting a part-time job, and not taking antidepressant medication.  Patient does report that she has been on Prozac "on and off" over period of months and that she tends to feel better when she is on it.  We discussed the above issues, if patient provides consent will speak with mother and review concerns about medications-provide psychoeducation regarding medication management-we also discussed a possible family meeting for the same purpose. Behavior in good control, polite and cooperative on approach, tends to remain isolated with limited milieu participation at this time Denies SI today.  Principal Problem: MDD, no psychotic symptoms Diagnosis:   Patient Active Problem List   Diagnosis Date Noted  . Major depressive disorder, recurrent severe without psychotic features ([place]) [F33.2] 05/20/2017  . MDD (major  depressive disorder), recurrent episode, severe ([place]) [F33.2] 05/20/2017  . Right-sided Bell's palsy [G51.0] 02/07/2014  . Shingles [B02.9] 02/07/2014   Total Time spent with patient: 20 minutes  Past Medical History:  Past Medical History:  Diagnosis Date  . Bell's palsy   . Hypertension     Past Surgical History:  Procedure Laterality Date  . WISDOM TOOTH EXTRACTION     Family History:  Family History  Problem Relation Age of Onset  . Depression Mother   . Diabetes Father   . Heart disease Father    Social History:  Social History   Substance and Sexual Activity  Alcohol Use Yes  . Alcohol/week: 0.0 oz   Comment: 1/2 bottle liquor in one day for the week     Social History   Substance and Sexual Activity  Drug Use No    Social History   Socioeconomic History  . Marital status: Single    Spouse name: Not on file  . Number of children: 0  . Years of education: Some colle  . Highest education level: Not on file  Occupational History  . Occupation: Management consultant - Therapist, art  Social Needs  . Financial resource strain: Not on file  . Food insecurity:    Worry: Not on file    Inability: Not on file  . Transportation needs:    Medical: Not on file    Non-medical: Not on file  Tobacco Use  . Smoking status: Light Tobacco Smoker    Packs/day: 0.25    Years: 2.00    Pack years: 0.50    Types: E-cigarettes  . Smokeless tobacco: Never Used  Substance and Sexual Activity  . Alcohol use: Yes    Alcohol/week: 0.0 oz  Comment: 1/2 bottle liquor in one day for the week  . Drug use: No  . Sexual activity: Not Currently  Lifestyle  . Physical activity:    Days per week: Not on file    Minutes per session: Not on file  . Stress: Not on file  Relationships  . Social connections:    Talks on phone: Not on file    Gets together: Not on file    Attends religious service: Not on file    Active member of club or organization: Not on file    Attends meetings of  clubs or organizations: Not on file    Relationship status: Not on file  Other Topics Concern  . Not on file  Social History Narrative   Right handed.   Lives at home with roommates.   Caffeine occasionally - not daily.   Additional Social History:    Pain Medications: See MAR Prescriptions: See MAR History of alcohol / drug use?: No history of alcohol / drug abuse Negative Consequences of Use: Financial, Personal relationships, Work / School  Sleep: Good  Appetite:  improving   Current Medications: Current Facility-Administered Medications  Medication Dose Route Frequency Provider Last Rate Last Dose  . acetaminophen (TYLENOL) tablet 650 mg  650 mg Oral Q6H PRN Nanci Pina, FNP      . alum & mag hydroxide-simeth (MAALOX/MYLANTA) 200-200-20 MG/5ML suspension 30 mL  30 mL Oral Q4H PRN Nanci Pina, FNP      . ARIPiprazole (ABILIFY) tablet 2 mg  2 mg Oral Daily Cobos, Myer Peer, MD   2 mg at 05/23/17 0818  . clonazePAM (KLONOPIN) tablet 0.5 mg  0.5 mg Oral BID PRN Sharma Covert, MD      . FLUoxetine (PROZAC) capsule 40 mg  40 mg Oral Daily Cobos, Myer Peer, MD   40 mg at 05/23/17 0818  . hydrOXYzine (ATARAX/VISTARIL) tablet 25 mg  25 mg Oral TID PRN Nanci Pina, FNP      . magnesium hydroxide (MILK OF MAGNESIA) suspension 30 mL  30 mL Oral Daily PRN Starkes, Takia S, FNP      . mupirocin cream (BACTROBAN) 2 %   Topical BID Starkes, Takia S, FNP      . nicotine (NICODERM CQ - dosed in mg/24 hours) patch 21 mg  21 mg Transdermal Daily Cobos, Myer Peer, MD   21 mg at 05/24/17 0814  . traZODone (DESYREL) tablet 100 mg  100 mg Oral QHS PRN Nanci Pina, FNP   100 mg at 05/22/17 2123    Lab Results:  No results found for this or any previous visit (from the past 48 hour(s)).  Blood Alcohol level:  Lab Results  Component Value Date   ETH <10 96/29/5284    Metabolic Disorder Labs: Lab Results  Component Value Date   HGBA1C 5.9 (H) 05/21/2017   MPG  122.63 05/21/2017   No results found for: PROLACTIN Lab Results  Component Value Date   CHOL 179 05/21/2017   TRIG 108 05/21/2017   HDL 34 (L) 05/21/2017   CHOLHDL 5.3 05/21/2017   VLDL 22 05/21/2017   LDLCALC 123 (H) 05/21/2017    Physical Findings: AIMS: Facial and Oral Movements Muscles of Facial Expression: None, normal Lips and Perioral Area: None, normal Jaw: None, normal Tongue: None, normal,Extremity Movements Upper (arms, wrists, hands, fingers): None, normal Lower (legs, knees, ankles, toes): None, normal, Trunk Movements Neck, shoulders, hips: None, normal, Overall Severity Severity of abnormal  movements (highest score from questions above): None, normal Incapacitation due to abnormal movements: None, normal Patient's awareness of abnormal movements (rate only patient's report): No Awareness, Dental Status Current problems with teeth and/or dentures?: Yes Does patient usually wear dentures?: Yes  CIWA:    COWS:     Musculoskeletal: Strength & Muscle Tone: within normal limits Gait & Station: normal Patient leans: N/A  Psychiatric Specialty Exam: Physical Exam  ROS denies headache, no chest pain , no shortness of breath, no vomiting , no fever or chills   Blood pressure 108/90, pulse 96, temperature 98.4 F (36.9 C), temperature source Oral, resp. rate 16, height _0  (1.676 m), weight 108.9 kg (240 lb), last menstrual period 05/16/2017.Body mass index is 38.74 kg/m.  General Appearance: Fairly Groomed  Eye Contact:  Good  Speech:  Normal Rate  Volume:  Decreased  Mood: Reports lingering depression, but states that she is feeling better than she did on admission  Affect:  Still constricted, but does smile appropriately at times  Thought Process:  Linear and Descriptions of Associations: Intact  Orientation:  Other:  Fully alert and attentive  Thought Content:  denies hallucinations, no delusions, not internally preoccupied   Suicidal Thoughts:  No today  denies suicidal or self injurious ideations, denies any homicidal or violent ideations  Homicidal Thoughts:  No  Memory:  recent and remote grossly intact   Judgement:  Other:  improving  Insight:  Fair  Psychomotor Activity:  Decreased  Concentration:  Concentration: Good and Attention Span: Good  Recall:  Good  Fund of Knowledge:  Good  Language:  Good  Akathisia:  Negative  Handed:  Right  AIMS (if indicated):     Assets:  Communication Skills Desire for Improvement Resilience  ADL's:  Intact  Cognition:  WNL  Sleep:  Number of Hours: 6.75   Assessment -patient continues to present with depression and constricted affect, although describes some improvement compared to admission.  Medication issues discussed.  Patient explains she herself feels that Prozac has helped but that her mother does not approve of psychiatric medications and has told her that she cannot be on  antidepressant as a condition of returning home.  She has not taken Prozac today.  Denies suicidal ideations.  Treatment Plan Summary: Treatment plan reviewed as below today May 14 Daily contact with patient to assess and evaluate symptoms and progress in treatment, Medication management, Plan inpatient treatment and medications as below Encourage group and milieu participation to work on coping skills and symptom reduction I have reviewed medication issues as above with patient and have discussed rationale to continue medication management at present, as reports history of good response and tolerance . Continue Prozac 40 mgr QDAY for depression Continue Abilify 2 mgrs QDAY for mood disorder, antidepressant augmentation Continue Klonopin 0.5 mgrs TID PRN for anxiety as needed  Continue Trazodone 100 mgrs QHS PRN for insomnia as needed  Treatment team working on disposition planning options-have offered family meeting with parents to discuss above concerns.  CSW will contact parents to arrange. Jenne Campus,  MD 05/24/2017, 11:44 AM   Patient ID: Stacy Wang, female   DOB: 05/22/1995, 22 y.o.   MRN: [id number]

## 2017-05-24 NOTE — [facility] Group Notes (Signed)
Adult Psychoeducational Group Note  Date:  05/24/2017 Time:  10:11 PM  Group Topic/Focus:  Wrap-Up Group:   The focus of this group is to help patients review their daily goal of treatment and discuss progress on daily workbooks.  Participation Level:  Active  Participation Quality:  Appropriate and Attentive  Affect:  Appropriate  Cognitive:  Alert and Appropriate  Insight: Appropriate and Good  Engagement in Group:  Engaged  Modes of Intervention:  Discussion and Education  Additional Comments:  Pt attended and participated in wrap up group this evening. Pt had god day due to it being their first day off of their meds. Pt completed their goal of talking with their mother during their visit this evening.   Chrisandra Netters 05/24/2017, 10:11 PM

## 2017-05-24 NOTE — Plan of Care (Signed)
Pt is progressing in the following metrics. Pt stated she slept well. Pt contracts for safety. Will continue to monitor.

## 2017-05-24 NOTE — Progress Notes (Signed)
On initial approach this evening, pt was in her room sitting on her bed.  She said that she has been struggling today emotionally and has been tearful.  She said that she did not take any of her medications to start weaning herself off of them as her parents do not feel she needs them and she can move in with them if she is not taking any psych meds.  She denies SI/HI/AVH at this time.  She voices no needs or concerns to Clinical research associate.  She was encouraged to engage with her peers and not isolate herself.  She went to bed around 2200, but came to the NS requesting something for sleep as she said she was not sleepy and wanted to be able to sleep.  Pt took Vistaril 25 mg instead of taking the Trazodone 50 mg.  Support and encouragement offered.  Discharge plans are in process.  Safety maintained with q15 minute checks.

## 2017-05-25 NOTE — [facility] Group Notes (Signed)
LCSW Group Therapy Note 05/25/2017 2:27 PM  Type of Therapy/Topic: Group Therapy: Feelings about Diagnosis  Participation Level: Active   Description of Group:  This group will allow patients to explore their thoughts and feelings about diagnoses they have received. Patients will be guided to explore their level of understanding and acceptance of these diagnoses. Facilitator will encourage patients to process their thoughts and feelings about the reactions of others to their diagnosis and will guide patients in identifying ways to discuss their diagnosis with significant others in their lives. This group will be process-oriented, with patients participating in exploration of their own experiences, giving and receiving support, and processing challenge from other group members.  Therapeutic Goals: 1. Patient will demonstrate understanding of diagnosis as evidenced by identifying two or more symptoms of the disorder 2. Patient will be able to express two feelings regarding the diagnosis 3. Patient will demonstrate their ability to communicate their needs through discussion and/or role play  Summary of Patient Progress:  Gurbani was engaged and participated throughout the group session. Novalyn reports that she felt like she was "sick" when she learned she had a mental health diagnosis. Ivelisse reports that she wants her parents to become more supportive of her and her struggles with her mental illness moving forward.     Therapeutic Modalities:  Cognitive Behavioral Therapy Brief Therapy Feelings Identification    Ariadne Rissmiller Catalina [place] Clinical Social Worker

## 2017-05-25 NOTE — [facility] Suicide Risk Assessment (Signed)
[facility] INPATIENT:  Family/Significant Other Suicide Prevention Education  Suicide Prevention Education:  Education Completed; Stacy Wang, mother [phone]) has been identified by the patient as the family member/significant other with whom the patient will be residing, and identified as the person(s) who will aid the patient in the event of a mental health crisis (suicidal ideations/suicide attempt).  With written consent from the patient, the family member/significant other has been provided the following suicide prevention education, prior to the and/or following the discharge of the patient.  The suicide prevention education provided includes the following:  Suicide risk factors  Suicide prevention and interventions  National Suicide Hotline telephone number  [facility] assessment telephone number  [facility] Emergency Assistance 911  [facility] and/or Residential Mobile Crisis Unit telephone number  Request made of family/significant other to:  Remove weapons (e.g., guns, rifles, knives), all items previously/currently identified as safety concern.    Remove drugs/medications (over-the-counter, prescriptions, illicit drugs), all items previously/currently identified as a safety concern.  The family member/significant other verbalizes understanding of the suicide prevention education information provided.  The family member/significant other agrees to remove the items of safety concern listed above.  Stacy Wang 05/25/2017, 1:06 PM

## 2017-05-25 NOTE — Progress Notes (Signed)
Recreation Therapy Notes  Date: 5.15.19 Time: 0930 Location: [address] Dayroom  Group Topic: Stress Management  Goal Area(s) Addresses:  Patient will verbalize importance of using healthy stress management.  Patient will identify positive emotions associated with healthy stress management.   Intervention: Stress Management  Activity :  Meditation.  LRT played a meditation on being resilient in the face of adversity.  Patients were to follow along as the meditation played.   Education:  Stress Management, Discharge Planning.   Education Outcome: Acknowledges edcuation/In group clarification offered/Needs additional education  Clinical Observations/Feedback: Pt did not attend group.    Caroll Rancher, LRT/CTRS         Lillia Abed, Robynne [place] A 05/25/2017 11:05 AM
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
## 2017-05-25 NOTE — Progress Notes (Signed)
Pt presents with an animated affect and anxious mood. Pt rates depression 1/10. Hopelessness 1/10. Anxiety 2/10. Pt reports difficulty sleeping last night, reported that she took Vistaril at bedtime but doesn't want to try anymore meds for sleep. Pt refused scheduled morning meds. Dr. Jama Flavors made aware. Pt stated that since she stopped taking her meds she's been able to emotionally think clearly. Pt denies SI/HI. Pt denies AVH.  Orders reviewed with pt. Verbal support provided. Pt encouraged to attend groups. 15 minute checks performed for safety.  Pt non compliant with taking meds. Pt compliant with attending groups. No concerns verbalized by pt.

## 2017-05-25 NOTE — Plan of Care (Signed)
  Problem: Role Relationship: Goal: Will demonstrate positive changes in social behaviors and relationships Outcome: Progressing   Problem: Activity: Goal: Interest or engagement in activities will improve Outcome: Progressing

## 2017-05-26 MED ORDER — NICOTINE 21 MG/24HR TD PT24: 21.0000 mg | MEDICATED_PATCH | Freq: Every day | TRANSDERMAL | 0 refills | Status: DC

## 2017-05-26 MED ORDER — MUPIROCIN CALCIUM 2 % EX CREA: TOPICAL_CREAM | Freq: Two times a day (BID) | CUTANEOUS | 0 refills | Status: DC

## 2017-05-26 MED ORDER — HYDROXYZINE HCL 25 MG PO TABS: 25.0000 mg | ORAL_TABLET | Freq: Three times a day (TID) | ORAL | 0 refills | Status: DC | PRN

## 2017-05-26 MED ORDER — TRAZODONE HCL 100 MG PO TABS: 100.0000 mg | ORAL_TABLET | Freq: Every evening | ORAL | 0 refills | Status: DC | PRN

## 2017-05-26 MED ORDER — CLONAZEPAM 0.5 MG PO TABS: 0.5000 mg | ORAL_TABLET | Freq: Two times a day (BID) | ORAL | 0 refills | Status: DC | PRN

## 2017-05-26 MED ORDER — AMLODIPINE BESYLATE 5 MG PO TABS: 5.0000 mg | ORAL_TABLET | Freq: Every day | ORAL | 0 refills | Status: DC

## 2017-05-26 NOTE — Progress Notes (Signed)
Patient discharged to lobby. Patient was stable and appreciative at that time. All papers and prescriptions were given and valuables returned. Verbal understanding expressed. Denies SI/HI and A/VH. Patient given opportunity to express concerns and ask questions.  
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
## 2017-05-26 NOTE — Progress Notes (Signed)
Pt has been observed out in the milieu this evening, interacting appropriately with peers.  She reports she has had a good day and states she may be discharged to her parents home on Thursday.  She denies SI/HI/AVH at this time.  She voices no other needs or concerns.  She requested a sleep aid at bedtime which she was given.  Support and encouragement offered.  Discharge plans are in process.  Safety maintained with q15 minute checks.

## 2017-05-26 NOTE — Progress Notes (Signed)
  [facility] Adult Case Management Discharge Plan :  Will you be returning to the same living situation after discharge:  Yes,  patient is returning home with her mother and father At discharge, do you have transportation home?: Yes,  patient is being picked up by her mother at discharge Do you have the ability to pay for your medications: Yes,  [employer]  Release of information consent forms completed and in the chart;  Patient's signature needed at discharge.  Patient to Follow up at: Follow-up Information    Baxter Kail. Go on 05/30/2017.   Why:  Appointment for therapy is Monday, 05/30/17 at 2:00pm.  Contact information: [facility] [address] [place]  [address] [phone]       Milagros Evener, MD. Go on 06/02/2017.   Specialty:  Psychiatry Why:  Appointment is Thursday, 06/02/17 at 1:30pm.  Contact information: [address]Tyson Babinski [place] [address] [phone]           Next level of care provider has access to [facility] Link:yes  Safety Planning and Suicide Prevention discussed: Yes,  with the patient's mother and father  Have you used any form of tobacco in the last 30 days? (Cigarettes, Smokeless Tobacco, Cigars, and/or Pipes): Yes  Has patient been referred to the Quitline?: Patient refused referral  Patient has been referred for addiction treatment: N/A  Maeola Sarah, LCSWA 05/26/2017, 11:11 AM

## 2017-06-27 ENCOUNTER — Other Ambulatory Visit: Payer: Self-pay

## 2017-06-27 ENCOUNTER — Encounter: Payer: Self-pay | Admitting: Physician Assistant

## 2017-06-27 ENCOUNTER — Ambulatory Visit: Payer: BC Managed Care – PPO | Admitting: Physician Assistant

## 2017-06-27 VITALS — BP 128/76 | HR 96 | Temp 100.0°F | Resp 20 | Ht 67.32 in | Wt 251.8 lb

## 2017-06-27 DIAGNOSIS — J0100 Acute maxillary sinusitis, unspecified: Secondary | ICD-10-CM | POA: Diagnosis not present

## 2017-06-27 DIAGNOSIS — J029 Acute pharyngitis, unspecified: Secondary | ICD-10-CM

## 2017-06-27 LAB — POCT RAPID STREP A (OFFICE): RAPID STREP A SCREEN: NEGATIVE

## 2017-06-27 MED ORDER — FLUTICASONE PROPIONATE 50 MCG/ACT NA SUSP: 2.0000 | Freq: Every day | NASAL | 0 refills | Status: DC

## 2017-06-27 MED ORDER — DOXYCYCLINE HYCLATE 100 MG PO CAPS
100.0000 mg | ORAL_CAPSULE | Freq: Two times a day (BID) | ORAL | 0 refills | Status: AC
Start: 2017-06-27 — End: 2017-07-04

## 2017-06-27 NOTE — Patient Instructions (Addendum)
Your symptoms are most consistent with viral sinusitis.  - I recommend you rest, drink plenty of fluids, eat light meals including soups.  - Continue anithistamine, flonase, and start over the counter nasal saline rinses daily. You can continue using sudafed as prescribed.  - You may also use Tylenol or ibuprofen over-the-counter for your sore throat. Tea recipe for sore throat: boil water, add 2 inches shaved ginger root, steep 15 minutes, add juice from 2 full lemons, and 2 tbsp honey. - If no improvement in 3-5 days, I have given you a prescription for an antibiotic. While taking Doxycycline:  -Do not drink milk or take iron supplements, multivitamins, calcium supplements, antacids, laxatives within 2 hours before or after taking doxycycline. -Avoid direct exposure to sunlight or tanning beds. Doxycycline can make you sunburn more easily. Wear protective clothing and use sunscreen (SPF 30 or higher) when you are outdoors. -Antibiotic medicines can cause diarrhea, which may be a sign of a new infection. If you have diarrhea that is watery or bloody, stop taking this medicine and seek medical care.       IF you received an x-ray today, you will receive an invoice from [facility]. Please contact [facility] at [phone] with questions or concerns regarding your invoice.   IF you received labwork today, you will receive an invoice from [facility]. Please contact [facility] at [phone] with questions or concerns regarding your invoice.   Our billing staff will not be able to assist you with questions regarding bills from these companies.  You will be contacted with the lab results as soon as they are available. The fastest way to get your results is to activate your My Chart account. Instructions are located on the last page of this paperwork. If you have not heard from us regarding the results in 2 weeks, please contact this office.     Sinusitis, Adult Sinusitis is  soreness and inflammation of your sinuses. Sinuses are hollow spaces in the bones around your face. They are located:  Around your eyes.  In the middle of your forehead.  Behind your nose.  In your cheekbones.  Your sinuses and nasal passages are lined with a stringy fluid (mucus). Mucus normally drains out of your sinuses. When your nasal tissues get inflamed or swollen, the mucus can get trapped or blocked so air cannot flow through your sinuses. This lets bacteria, viruses, and funguses grow, and that leads to infection. Follow these instructions at home: Medicines  Take, use, or apply over-the-counter and prescription medicines only as told by your doctor. These may include nasal sprays.  If you were prescribed an antibiotic medicine, take it as told by your doctor. Do not stop taking the antibiotic even if you start to feel better. Hydrate and Humidify  Drink enough water to keep your pee (urine) clear or pale yellow.  Use a cool mist humidifier to keep the humidity level in your home above 50%.  Breathe in steam for 10-15 minutes, 3-4 times a day or as told by your doctor. You can do this in the bathroom while a hot shower is running.  Try not to spend time in cool or dry air. Rest  Rest as much as possible.  Sleep with your head raised (elevated).  Make sure to get enough sleep each night. General instructions  Put a warm, moist washcloth on your face 3-4 times a day or as told by your doctor. This will help with discomfort.  Wash your hands  often with soap and water. If there is no soap and water, use hand sanitizer.  Do not smoke. Avoid being around people who are smoking (secondhand smoke).  Keep all follow-up visits as told by your doctor. This is important. Contact a doctor if:  You have a fever.  Your symptoms get worse.  Your symptoms do not get better within 10 days. Get help right away if:  You have a very bad headache.  You cannot stop throwing up  (vomiting).  You have pain or swelling around your face or eyes.  You have trouble seeing.  You feel confused.  Your neck is stiff.  You have trouble breathing. This information is not intended to replace advice given to you by your health care provider. Make sure you discuss any questions you have with your health care provider. Document Released: 06/16/2007 Document Revised: 08/24/2015 Document Reviewed: 10/23/2014 Elsevier Interactive Patient Education  [employer].

## 2017-06-27 NOTE — Progress Notes (Signed)
MRN: [id number] DOB: Apr 27, 1995  Subjective:   Stacy Wang is a 22 y.o. female presenting for chief complaint of Sore Throat (X 3 days- pt stat pain in the back of her throat, sides and back of her neck) and Generalized Body Aches (X 3 days- with fever) .  Reports 4 day history of sinus pressure, nasal congestion, yellow/brown nasal sputum, ear fullness, sore throat, and body aches. She is coughing due to the post nasal drainage but does not feel like it is in her chest. Has associated fever.   [facility] and was treated symptomatically for viral URI. Has tried ibuprofen, tylenol, and sudafed with no full relief. Denies wheezing, shortness of breath and chest pain, chills, nausea, vomiting, abdominal pain and diarrhea. Has had sick contact with family members. Has history of seasonal allergies, takes nasal spray and antihistamine, but hasn't been taking it over the past few days. No history of asthma. Patient has had flu shot this season. Quit smoking 05/2017.  Denies any other aggravating or relieving factors, no other questions or concerns.  Stacy Wang has a current medication list which includes the following prescription(s): amlodipine, levonorgestrel-ethinyl estrad, nicotine, clonazepam, doxycycline, fluticasone, hydroxyzine, mupirocin cream, and trazodone. Also is allergic to pollen extract.  Stacy Wang  has a past medical history of Bell's palsy and Hypertension. Also  has a past surgical history that includes Wisdom tooth extraction.    Social History   Socioeconomic History  . Marital status: Single    Spouse name: Not on file  . Number of children: 0  . Years of education: Some colle  . Highest education level: Not on file  Occupational History  . 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 Financial resource strain: Not on file  . Food insecurity:    Worry: Not on file    Inability: Not on file  . Transportation needs:    Medical: Not on  file    Non-medical: Not on file  Tobacco Use  . Smoking status: Former Smoker    Packs/day: 0.25    Years: 2.00    Pack years: 0.50    Types: E-cigarettes    Last attempt to quit: 05/15/2017    Years since quitting: 0.1  . Smokeless tobacco: Never Used  Substance and Sexual Activity  . Alcohol use: Not Currently    Alcohol/week: 0.0 oz    Comment: 1/2 bottle liquor in one day for the week  . Drug use: No  . Sexual activity: Not Currently  Lifestyle  . Physical activity:    Days per week: Not on file    Minutes per session: Not on file  . Stress: Not on file  Relationships  . Social connections:    Talks on phone: Not on file    Gets together: Not on file    Attends religious service: Not on file    Active member of club or organization: Not on file    Attends meetings of clubs or organizations: Not on file    Relationship status: Not on file  . Intimate partner violence:    Fear of current or ex partner: Not on file    Emotionally abused: Not on file    Physically abused: Not on file    Forced sexual activity: Not on file  Other Topics Concern  . Not on file  Social History Narrative   Right handed.   Lives at home with roommates.  Caffeine occasionally - not daily.    Objective:   Vitals: BP 128/76 (BP Location: Left Arm, Patient Position: Sitting, Cuff Size: Large)   Pulse 96   Temp 100 F (37.8 C) (Oral)   Resp 20   Ht 5' 7.32" (1.71 m)   Wt 251 lb 12.8 oz (114.2 kg)   LMP 06/25/2017 (Exact Date)   SpO2 98%   BMI 39.06 kg/m   Physical Exam  Constitutional: She is oriented to person, place, and time. She appears well-developed and well-nourished. No distress.  HENT:  Head: Normocephalic and atraumatic.  Right Ear: Tympanic membrane, external ear and ear canal normal.  Left Ear: Tympanic membrane, external ear and ear canal normal.  Nose: Mucosal edema (moderate bilaterally) present. Right sinus exhibits maxillary sinus tenderness. Right sinus exhibits  no frontal sinus tenderness. Left sinus exhibits maxillary sinus tenderness. Left sinus exhibits no frontal sinus tenderness.  Mouth/Throat: Uvula is midline and mucous membranes are normal. Posterior oropharyngeal erythema present. No posterior oropharyngeal edema or tonsillar abscesses. Tonsils are 2+ on the right. Tonsils are 2+ on the left. No tonsillar exudate.  Eyes: Pupils are equal, round, and reactive to light. Conjunctivae are normal.  Neck: Normal range of motion.  Cardiovascular: Normal rate, regular rhythm, normal heart sounds and intact distal pulses.  Pulmonary/Chest: Effort normal and breath sounds normal. She has no decreased breath sounds. She has no wheezes. She has no rhonchi. She has no rales.  Lymphadenopathy:       Head (right side): No submental, no submandibular, no tonsillar, no preauricular, no posterior auricular and no occipital adenopathy present.       Head (left side): No submental, no submandibular, no tonsillar, no preauricular, no posterior auricular and no occipital adenopathy present.    She has no cervical adenopathy.       Right: No supraclavicular adenopathy present.       Left: No supraclavicular adenopathy present.  Neurological: She is alert and oriented to person, place, and time.  Skin: Skin is warm and dry.  Psychiatric: She has a normal mood and affect.  Vitals reviewed.   Results for orders placed or performed in visit on 06/27/17 (from the past 24 hour(s))  POCT rapid strep A     Status: None   Collection Time: 06/27/17  5:31 PM  Result Value Ref Range   Rapid Strep A Screen Negative Negative    Assessment and Plan :  1. Acute maxillary sinusitis, recurrence not specified - Likely viral in etiology d/t reassuring physical exam findings and labs. - Advised supportive care, offered symptomatic relief. Recommend starting antihistamine, flonase, and nasal saline rinses. Continue sudafed.  If no improvement in 3-5 days, given print out for abx  to take. Advised to return to clinic if symptoms worsen or fail to improve, otherwise return to clinic as needed. - doxycycline (VIBRAMYCIN) 100 MG capsule; Take 1 capsule (100 mg total) by mouth 2 (two) times daily for 7 days.  Dispense: 14 capsule; Refill: 0 - fluticasone (FLONASE) 50 MCG/ACT nasal spray; Place 2 sprays into both nostrils daily.  Dispense: 16 g; Refill: 0  2. Sore throat Rapid strep test negative. Cx pending.  - POCT rapid strep A - Culture, Group A Strep  Benjiman Core, PA-C  [facility] 06/27/2017 6:50 PM

## 2017-06-29 LAB — CULTURE, GROUP A STREP: STREP A CULTURE: NEGATIVE

## 2017-07-30 ENCOUNTER — Other Ambulatory Visit: Payer: Self-pay | Admitting: Physician Assistant

## 2017-07-30 DIAGNOSIS — J0100 Acute maxillary sinusitis, unspecified: Secondary | ICD-10-CM

## 2017-09-13 ENCOUNTER — Encounter

## 2017-09-13 ENCOUNTER — Ambulatory Visit: Payer: BC Managed Care – PPO | Admitting: Neurology

## 2017-09-13 ENCOUNTER — Telehealth: Payer: Self-pay | Admitting: *Deleted

## 2017-09-13 NOTE — Telephone Encounter (Signed)
No showed follow up appointment. 

## 2017-09-14 ENCOUNTER — Encounter: Payer: Self-pay | Admitting: Neurology

## 2017-12-02 ENCOUNTER — Other Ambulatory Visit: Payer: Self-pay | Admitting: Internal Medicine

## 2017-12-21 ENCOUNTER — Ambulatory Visit: Payer: BC Managed Care – PPO | Admitting: Internal Medicine

## 2017-12-21 ENCOUNTER — Encounter: Payer: Self-pay | Admitting: Internal Medicine

## 2017-12-21 VITALS — BP 128/94 | HR 103 | Temp 98.3°F | Ht 67.0 in | Wt 268.2 lb

## 2017-12-21 DIAGNOSIS — Z6841 Body Mass Index (BMI) 40.0 and over, adult: Secondary | ICD-10-CM

## 2017-12-21 DIAGNOSIS — R7309 Other abnormal glucose: Secondary | ICD-10-CM | POA: Diagnosis not present

## 2017-12-21 DIAGNOSIS — I10 Essential (primary) hypertension: Secondary | ICD-10-CM | POA: Insufficient documentation

## 2017-12-21 DIAGNOSIS — R000 Tachycardia, unspecified: Secondary | ICD-10-CM

## 2017-12-21 NOTE — Patient Instructions (Signed)
DASH Eating Plan DASH stands for "Dietary Approaches to Stop Hypertension." The DASH eating plan is a healthy eating plan that has been shown to reduce high blood pressure (hypertension). It may also reduce your risk for type 2 diabetes, heart disease, and stroke. The DASH eating plan may also help with weight loss. What are tips for following this plan? General guidelines  Avoid eating more than 2,300 mg (milligrams) of salt (sodium) a day. If you have hypertension, you may need to reduce your sodium intake to 1,500 mg a day.  Limit alcohol intake to no more than 1 drink a day for nonpregnant women and 2 drinks a day for men. One drink equals 12 oz of beer, 5 oz of wine, or 1 oz of hard liquor.  Work with your health care provider to maintain a healthy body weight or to lose weight. Ask what an ideal weight is for you.  Get at least 30 minutes of exercise that causes your heart to beat faster (aerobic exercise) most days of the week. Activities may include walking, swimming, or biking.  Work with your health care provider or diet and nutrition specialist (dietitian) to adjust your eating plan to your individual calorie needs. Reading food labels  Check food labels for the amount of sodium per serving. Choose foods with less than 5 percent of the Daily Value of sodium. Generally, foods with less than 300 mg of sodium per serving fit into this eating plan.  To find whole grains, look for the word "whole" as the first word in the ingredient list. Shopping  Buy products labeled as "low-sodium" or "no salt added."  Buy fresh foods. Avoid canned foods and premade or frozen meals. Cooking  Avoid adding salt when cooking. Use salt-free seasonings or herbs instead of table salt or sea salt. Check with your health care provider or pharmacist before using salt substitutes.  Do not fry foods. Cook foods using healthy methods such as baking, boiling, grilling, and broiling instead.  Cook with  heart-healthy oils, such as olive, canola, soybean, or sunflower oil. Meal planning   Eat a balanced diet that includes: ? 5 or more servings of fruits and vegetables each day. At each meal, try to fill half of your plate with fruits and vegetables. ? Up to 6-8 servings of whole grains each day. ? Less than 6 oz of lean meat, poultry, or fish each day. A 3-oz serving of meat is about the same size as a deck of cards. One egg equals 1 oz. ? 2 servings of low-fat dairy each day. ? A serving of nuts, seeds, or beans 5 times each week. ? Heart-healthy fats. Healthy fats called Omega-3 fatty acids are found in foods such as flaxseeds and coldwater fish, like sardines, salmon, and mackerel.  Limit how much you eat of the following: ? Canned or prepackaged foods. ? Food that is high in trans fat, such as fried foods. ? Food that is high in saturated fat, such as fatty meat. ? Sweets, desserts, sugary drinks, and other foods with added sugar. ? Full-fat dairy products.  Do not salt foods before eating.  Try to eat at least 2 vegetarian meals each week.  Eat more home-cooked food and less restaurant, buffet, and fast food.  When eating at a restaurant, ask that your food be prepared with less salt or no salt, if possible. What foods are recommended? The items listed may not be a complete list. Talk with your dietitian about what   dietary choices are best for you. Grains Whole-grain or whole-wheat bread. Whole-grain or whole-wheat pasta. Brown rice. Oatmeal. Quinoa. Bulgur. Whole-grain and low-sodium cereals. Pita bread. Low-fat, low-sodium crackers. Whole-wheat flour tortillas. Vegetables Fresh or frozen vegetables (raw, steamed, roasted, or grilled). Low-sodium or reduced-sodium tomato and vegetable juice. Low-sodium or reduced-sodium tomato sauce and tomato paste. Low-sodium or reduced-sodium canned vegetables. Fruits All fresh, dried, or frozen fruit. Canned fruit in natural juice (without  added sugar). Meat and other protein foods Skinless chicken or turkey. Ground chicken or turkey. Pork with fat trimmed off. Fish and seafood. Egg whites. Dried beans, peas, or lentils. Unsalted nuts, nut butters, and seeds. Unsalted canned beans. Lean cuts of beef with fat trimmed off. Low-sodium, lean deli meat. Dairy Low-fat (1%) or fat-free (skim) milk. Fat-free, low-fat, or reduced-fat cheeses. Nonfat, low-sodium ricotta or cottage cheese. Low-fat or nonfat yogurt. Low-fat, low-sodium cheese. Fats and oils Soft margarine without trans fats. Vegetable oil. Low-fat, reduced-fat, or light mayonnaise and salad dressings (reduced-sodium). Canola, safflower, olive, soybean, and sunflower oils. Avocado. Seasoning and other foods Herbs. Spices. Seasoning mixes without salt. Unsalted popcorn and pretzels. Fat-free sweets. What foods are not recommended? The items listed may not be a complete list. Talk with your dietitian about what dietary choices are best for you. Grains Baked goods made with fat, such as croissants, muffins, or some breads. Dry pasta or rice meal packs. Vegetables Creamed or fried vegetables. Vegetables in a cheese sauce. Regular canned vegetables (not low-sodium or reduced-sodium). Regular canned tomato sauce and paste (not low-sodium or reduced-sodium). Regular tomato and vegetable juice (not low-sodium or reduced-sodium). Pickles. Olives. Fruits Canned fruit in a light or heavy syrup. Fried fruit. Fruit in cream or butter sauce. Meat and other protein foods Fatty cuts of meat. Ribs. Fried meat. Bacon. Sausage. Bologna and other processed lunch meats. Salami. Fatback. Hotdogs. Bratwurst. Salted nuts and seeds. Canned beans with added salt. Canned or smoked fish. Whole eggs or egg yolks. Chicken or turkey with skin. Dairy Whole or 2% milk, cream, and half-and-half. Whole or full-fat cream cheese. Whole-fat or sweetened yogurt. Full-fat cheese. Nondairy creamers. Whipped toppings.  Processed cheese and cheese spreads. Fats and oils Butter. Stick margarine. Lard. Shortening. Ghee. Bacon fat. Tropical oils, such as coconut, palm kernel, or palm oil. Seasoning and other foods Salted popcorn and pretzels. Onion salt, garlic salt, seasoned salt, table salt, and sea salt. Worcestershire sauce. Tartar sauce. Barbecue sauce. Teriyaki sauce. Soy sauce, including reduced-sodium. Steak sauce. Canned and packaged gravies. Fish sauce. Oyster sauce. Cocktail sauce. Horseradish that you find on the shelf. Ketchup. Mustard. Meat flavorings and tenderizers. Bouillon cubes. Hot sauce and Tabasco sauce. Premade or packaged marinades. Premade or packaged taco seasonings. Relishes. Regular salad dressings. Where to find more information:  National Heart, Lung, and Blood Institute: www.nhlbi.nih.gov  American Heart Association: www.heart.org Summary  The DASH eating plan is a healthy eating plan that has been shown to reduce high blood pressure (hypertension). It may also reduce your risk for type 2 diabetes, heart disease, and stroke.  With the DASH eating plan, you should limit salt (sodium) intake to 2,300 mg a day. If you have hypertension, you may need to reduce your sodium intake to 1,500 mg a day.  When on the DASH eating plan, aim to eat more fresh fruits and vegetables, whole grains, lean proteins, low-fat dairy, and heart-healthy fats.  Work with your health care provider or diet and nutrition specialist (dietitian) to adjust your eating plan to your individual   calorie needs. This information is not intended to replace advice given to you by your health care provider. Make sure you discuss any questions you have with your health care provider. Document Released: 12/17/2010 Document Revised: 12/22/2015 Document Reviewed: 12/22/2015 Elsevier Interactive Patient Education  2018 Elsevier Inc.  

## 2017-12-22 LAB — BMP8+EGFR
BUN/Creatinine Ratio: 11 (ref 9–23)
BUN: 8 mg/dL (ref 6–20)
CO2: 23 mmol/L (ref 20–29)
Calcium: 9.2 mg/dL (ref 8.7–10.2)
Chloride: 101 mmol/L (ref 96–106)
Creatinine, Ser: 0.73 mg/dL (ref 0.57–1.00)
GFR calc Af Amer: 133 mL/min/{1.73_m2} (ref >59)
GFR calc non Af Amer: 116 mL/min/{1.73_m2} (ref >59)
Glucose: 106 mg/dL — ABNORMAL HIGH (ref 65–99)
Potassium: 4.1 mmol/L (ref 3.5–5.2)
Sodium: 140 mmol/L (ref 134–144)

## 2017-12-22 LAB — HEMOGLOBIN A1C
Est. average glucose Bld gHb Est-mCnc: 126 mg/dL
Hgb A1c MFr Bld: 6 % — ABNORMAL HIGH (ref 4.8–5.6)

## 2017-12-22 NOTE — Progress Notes (Signed)
Here are your lab results:  Your hba1c is 6.0, this is in prediabetes range. Please avoid sugary beverages including sweet teas, sodas and juices. It is also important to incorporate more exercise program.   Your kidney function is normal. Happy holidays to you and your family!  Sincerely,    Louann Hopson N. Allyne GeeSanders, MD

## 2017-12-24 NOTE — Progress Notes (Signed)
Subjective:     Patient ID: Stacy Wang , female    DOB: 05-18-1995 , 22 y.o.   MRN: [id number]   Chief Complaint  Patient presents with  . Hypertension    HPI  Hypertension  This is a chronic problem. The current episode started more than 1 year ago. The problem has been gradually improving since onset. The problem is controlled. Pertinent negatives include no anxiety, blurred vision, chest pain or shortness of breath. Risk factors for coronary artery disease include obesity and sedentary lifestyle.     Past Medical History:  Diagnosis Date  . Bell's palsy   . Hypertension      Family History  Problem Relation Age of Onset  . Depression Mother   . Diabetes Father   . Heart disease Father      Current Outpatient Medications:  .  amLODipine (NORVASC) 5 MG tablet, TAKE 1 TABLET BY MOUTH ONCE DAILY, Disp: 90 tablet, Rfl: 0 .  fluticasone (FLONASE) 50 MCG/ACT nasal spray, Place 2 sprays into both nostrils daily., Disp: 16 g, Rfl: 0 .  Levonorgestrel-Ethinyl Estrad (VIENVA PO), Take by mouth., Disp: , Rfl:    Allergies  Allergen Reactions  . Pollen Extract Itching     Review of Systems  Constitutional: Negative.   Eyes: Negative for blurred vision.  Respiratory: Negative.  Negative for shortness of breath.   Cardiovascular: Negative.  Negative for chest pain.  Gastrointestinal: Negative.   Neurological: Negative.   Psychiatric/Behavioral: Negative.      Today's Vitals   12/21/17 1017  BP: (!) 128/94  Pulse: (!) 103  Temp: 98.3 F (36.8 C)  TempSrc: Oral  Weight: 268 lb 3.2 oz (121.7 kg)  Height: '5\' 7"'  (1.702 m)   Body mass index is 42.01 kg/m.   Objective:  Physical Exam Vitals signs and nursing note reviewed.  Constitutional:      Appearance: Normal appearance. She is obese.  HENT:     Head: Normocephalic and atraumatic.  Neck:     Musculoskeletal: Normal range of motion.  Cardiovascular:     Rate and Rhythm: Normal rate and regular rhythm.     Heart sounds: Normal heart sounds.  Pulmonary:     Effort: Pulmonary effort is normal.     Breath sounds: Normal breath sounds.  Skin:    General: Skin is warm and dry.  Neurological:     General: No focal deficit present.     Mental Status: She is alert.  Psychiatric:        Mood and Affect: Mood normal.        Behavior: Behavior normal.         Assessment And Plan:     1. Essential hypertension, benign  Well controlled. She will continue with current meds.  She is encouraged to avoid adding salt to her foods. She is encouraged to incorporate more exercise into her daily routine.   - BMP8+EGFR  2. Tachycardia  She is encouraged to increase her water intake. She is also encouraged to start magnesium nightly. I will reassess at her next visit.  - BMP8+EGFR  3. Other abnormal glucose  HER A1C HAS BEEN ELEVATED IN THE PAST. I WILL CHECK AN A1C, BMET TODAY. SHE WAS ENCOURAGED TO AVOID SUGARY BEVERAGES AND PROCESSED FOODS INCLUDNG BREADS, RICE AND PASTA.  - Hemoglobin A1c   4. Class 3 severe obesity due to excess calories with serious comorbidity and body mass index (BMI) of 40.0 to 44.9 in adult [facility])  She is encouraged to strive for BMI less than 35 to decrease cardiac risk. She is encouraged to exercise 30 minutes five days weekly. I will reassess at her next visit.   Maximino Greenland, MD

## 2018-01-13 DIAGNOSIS — F332 Major depressive disorder, recurrent severe without psychotic features: Secondary | ICD-10-CM | POA: Diagnosis not present

## 2018-01-13 DIAGNOSIS — F4311 Post-traumatic stress disorder, acute: Secondary | ICD-10-CM | POA: Diagnosis not present

## 2018-01-13 DIAGNOSIS — F331 Major depressive disorder, recurrent, moderate: Secondary | ICD-10-CM | POA: Diagnosis not present

## 2018-01-13 DIAGNOSIS — Z634 Disappearance and death of family member: Secondary | ICD-10-CM | POA: Diagnosis not present

## 2018-01-20 DIAGNOSIS — F4311 Post-traumatic stress disorder, acute: Secondary | ICD-10-CM | POA: Diagnosis not present

## 2018-01-20 DIAGNOSIS — F332 Major depressive disorder, recurrent severe without psychotic features: Secondary | ICD-10-CM | POA: Diagnosis not present

## 2018-01-20 DIAGNOSIS — Z634 Disappearance and death of family member: Secondary | ICD-10-CM | POA: Diagnosis not present

## 2018-01-23 DIAGNOSIS — F4311 Post-traumatic stress disorder, acute: Secondary | ICD-10-CM | POA: Diagnosis not present

## 2018-01-23 DIAGNOSIS — F332 Major depressive disorder, recurrent severe without psychotic features: Secondary | ICD-10-CM | POA: Diagnosis not present

## 2018-01-23 DIAGNOSIS — Z634 Disappearance and death of family member: Secondary | ICD-10-CM | POA: Diagnosis not present

## 2018-01-25 DIAGNOSIS — Z01419 Encounter for gynecological examination (general) (routine) without abnormal findings: Secondary | ICD-10-CM | POA: Diagnosis not present

## 2018-01-25 DIAGNOSIS — Z124 Encounter for screening for malignant neoplasm of cervix: Secondary | ICD-10-CM | POA: Diagnosis not present

## 2018-01-25 DIAGNOSIS — N926 Irregular menstruation, unspecified: Secondary | ICD-10-CM | POA: Diagnosis not present

## 2018-01-25 DIAGNOSIS — Z6841 Body Mass Index (BMI) 40.0 and over, adult: Secondary | ICD-10-CM | POA: Diagnosis not present

## 2018-01-25 LAB — HM PAP SMEAR: HM Pap smear: ABNORMAL

## 2018-01-26 DIAGNOSIS — Z124 Encounter for screening for malignant neoplasm of cervix: Secondary | ICD-10-CM | POA: Diagnosis not present

## 2018-01-30 LAB — HM PAP SMEAR: HM Pap smear: NORMAL

## 2018-02-03 DIAGNOSIS — F332 Major depressive disorder, recurrent severe without psychotic features: Secondary | ICD-10-CM | POA: Diagnosis not present

## 2018-02-03 DIAGNOSIS — Z634 Disappearance and death of family member: Secondary | ICD-10-CM | POA: Diagnosis not present

## 2018-02-03 DIAGNOSIS — F4311 Post-traumatic stress disorder, acute: Secondary | ICD-10-CM | POA: Diagnosis not present

## 2018-02-08 DIAGNOSIS — Z634 Disappearance and death of family member: Secondary | ICD-10-CM | POA: Diagnosis not present

## 2018-02-08 DIAGNOSIS — F4311 Post-traumatic stress disorder, acute: Secondary | ICD-10-CM | POA: Diagnosis not present

## 2018-02-08 DIAGNOSIS — F332 Major depressive disorder, recurrent severe without psychotic features: Secondary | ICD-10-CM | POA: Diagnosis not present

## 2018-02-11 DIAGNOSIS — F4312 Post-traumatic stress disorder, chronic: Secondary | ICD-10-CM | POA: Diagnosis not present

## 2018-02-11 DIAGNOSIS — Z634 Disappearance and death of family member: Secondary | ICD-10-CM | POA: Diagnosis not present

## 2018-02-11 DIAGNOSIS — F332 Major depressive disorder, recurrent severe without psychotic features: Secondary | ICD-10-CM | POA: Diagnosis not present

## 2018-02-17 DIAGNOSIS — Z634 Disappearance and death of family member: Secondary | ICD-10-CM | POA: Diagnosis not present

## 2018-02-17 DIAGNOSIS — F4311 Post-traumatic stress disorder, acute: Secondary | ICD-10-CM | POA: Diagnosis not present

## 2018-02-17 DIAGNOSIS — F332 Major depressive disorder, recurrent severe without psychotic features: Secondary | ICD-10-CM | POA: Diagnosis not present

## 2018-02-24 DIAGNOSIS — F332 Major depressive disorder, recurrent severe without psychotic features: Secondary | ICD-10-CM | POA: Diagnosis not present

## 2018-02-24 DIAGNOSIS — F4311 Post-traumatic stress disorder, acute: Secondary | ICD-10-CM | POA: Diagnosis not present

## 2018-02-24 DIAGNOSIS — Z634 Disappearance and death of family member: Secondary | ICD-10-CM | POA: Diagnosis not present

## 2018-02-26 ENCOUNTER — Other Ambulatory Visit: Payer: Self-pay | Admitting: Internal Medicine

## 2018-02-27 ENCOUNTER — Other Ambulatory Visit: Payer: Self-pay

## 2018-02-27 ENCOUNTER — Emergency Department (HOSPITAL_COMMUNITY)
Admission: EM | Admit: 2018-02-27 | Discharge: 2018-02-28 | Disposition: A | Discharge status: Home / Self Care | Payer: BC Managed Care – PPO | Attending: Emergency Medicine | Admitting: Emergency Medicine

## 2018-02-27 ENCOUNTER — Encounter: Payer: Self-pay | Admitting: Family Medicine

## 2018-02-27 ENCOUNTER — Ambulatory Visit: Payer: BC Managed Care – PPO | Admitting: Family Medicine

## 2018-02-27 ENCOUNTER — Encounter (HOSPITAL_COMMUNITY): Payer: Self-pay

## 2018-02-27 VITALS — BP 118/90 | HR 86 | Temp 98.8°F | Resp 14 | Ht 67.0 in | Wt 278.6 lb

## 2018-02-27 DIAGNOSIS — Z8669 Personal history of other diseases of the nervous system and sense organs: Secondary | ICD-10-CM

## 2018-02-27 DIAGNOSIS — R531 Weakness: Secondary | ICD-10-CM

## 2018-02-27 DIAGNOSIS — H538 Other visual disturbances: Secondary | ICD-10-CM

## 2018-02-27 DIAGNOSIS — G43909 Migraine, unspecified, not intractable, without status migrainosus: Secondary | ICD-10-CM | POA: Diagnosis present

## 2018-02-27 DIAGNOSIS — Z5321 Procedure and treatment not carried out due to patient leaving prior to being seen by health care provider: Secondary | ICD-10-CM | POA: Diagnosis not present

## 2018-02-27 DIAGNOSIS — R51 Headache: Secondary | ICD-10-CM

## 2018-02-27 DIAGNOSIS — R519 Headache, unspecified: Secondary | ICD-10-CM

## 2018-02-27 HISTORY — DX: Migraine, unspecified, not intractable, without status migrainosus: G43.909

## 2018-02-27 NOTE — Patient Instructions (Addendum)
I do think that the recent increase in headaches are related to decreased sleep.  That can cause flare of migraine headaches.  However I am concerned about the worsening symptoms since 8 this morning with your right vision changes and right-sided weakness.  I do not appreciate any weakness on exam at present time but do think that should be evaluated further through the emergency room tonight.   If any changes in symptoms on the way to the ER, pull over and call 911.   After ER visit - I would recommend contacting your neurologist to discuss headache treatment further.    General Headache Without Cause A headache is pain or discomfort that is felt around the head or neck area. There are many causes and types of headaches. In some cases, the cause may not be found. Follow these instructions at home: Watch your condition for any changes. Let your doctor know about them. Take these steps to help with your condition: Managing pain      Take over-the-counter and prescription medicines only as told by your doctor.  Lie down in a dark, quiet room when you have a headache.  If told, put ice on your head and neck area: ? Put ice in a plastic bag. ? Place a towel between your skin and the bag. ? Leave the ice on for 20 minutes, 2-3 times per day.  If told, put heat on the affected area. Use the heat source that your doctor recommends, such as a moist heat pack or a heating pad. ? Place a towel between your skin and the heat source. ? Leave the heat on for 20-30 minutes. ? Remove the heat if your skin turns bright red. This is very important if you are unable to feel pain, heat, or cold. You may have a greater risk of getting burned.  Keep lights dim if bright lights bother you or make your headaches worse. Eating and drinking  Eat meals on a regular schedule.  If you drink alcohol: ? Limit how much you use to:  0-1 drink a day for women.  0-2 drinks a day for men. ? Be aware of how  much alcohol is in your drink. In the [place] one drink equals one 12 oz bottle of beer (355 mL), one 5 oz glass of wine (148 mL), or one 1 oz glass of hard liquor (44 mL).  Stop drinking caffeine, or reduce how much caffeine you drink. General instructions   Keep a journal to find out if certain things bring on headaches. For example, write down: ? What you eat and drink. ? How much sleep you get. ? Any change to your diet or medicines.  Get a massage or try other ways to relax.  Limit stress.  Sit up straight. Do not tighten (tense) your muscles.  Do not use any products that contain nicotine or tobacco. This includes cigarettes, e-cigarettes, and chewing tobacco. If you need help quitting, ask your doctor.  Exercise regularly as told by your doctor.  Get enough sleep. This often means 7-9 hours of sleep each night.  Keep all follow-up visits as told by your doctor. This is important. Contact a doctor if:  Your symptoms are not helped by medicine.  You have a headache that feels different than the other headaches.  You feel sick to your stomach (nauseous) or you throw up (vomit).  You have a fever. Get help right away if:  Your headache gets very bad quickly.  Your headache gets worse after a lot of physical activity.  You keep throwing up.  You have a stiff neck.  You have trouble seeing.  You have trouble speaking.  You have pain in the eye or ear.  Your muscles are weak or you lose muscle control.  You lose your balance or have trouble walking.  You feel like you will pass out (faint) or you pass out.  You are mixed up (confused).  You have a seizure. Summary  A headache is pain or discomfort that is felt around the head or neck area.  There are many causes and types of headaches. In some cases, the cause may not be found.  Keep a journal to help find out what causes your headaches. Watch your condition for any changes. Let your doctor know about  them.  Contact a doctor if you have a headache that is different from usual, or if your headache is not helped by medicine.  Get help right away if your headache gets very bad, you throw up, you have trouble seeing, you lose your balance, or you have a seizure. This information is not intended to replace advice given to you by your health care provider. Make sure you discuss any questions you have with your health care provider. Document Released: 10/07/2007 Document Revised: 07/18/2017 Document Reviewed: 07/18/2017 Elsevier Interactive Patient Education  [employer].    If you have lab work done today you will be contacted with your lab results within the next 2 weeks.  If you have not heard from [place] then please contact us. The fastest way to get your results is to register for My Chart.   IF you received an x-ray today, you will receive an invoice from [facility]. Please contact [facility] at [phone] with questions or concerns regarding your invoice.   IF you received labwork today, you will receive an invoice from [facility]. Please contact [facility] at [phone] with questions or concerns regarding your invoice.   Our billing staff will not be able to assist you with questions regarding bills from these companies.  You will be contacted with the lab results as soon as they are available. The fastest way to get your results is to activate your My Chart account. Instructions are located on the last page of this paperwork. If you have not heard from [place] regarding the results in 2 weeks, please contact this office.

## 2018-02-27 NOTE — Progress Notes (Signed)
Positive for HPV repeat in 1 yr

## 2018-02-27 NOTE — ED Triage Notes (Signed)
Patient complains of ongoing migraine headache x 2 weeks. States that the past day has had vision loss in right eye and also weakness in right arm and leg, on assessment reports vision improved throughout the day and no weakness and no grip strength noted on right, alert and oriented, denies trauma

## 2018-02-27 NOTE — Progress Notes (Signed)
Subjective:    Patient ID: Stacy Wang, female    DOB: 04/21/1995, 23 y.o.   MRN: [id number]  HPI Stacy Wang is a 23 y.o. female Presents today for: Chief Complaint  Patient presents with  . Headache    was last seen here 06/27/17 . Headaches getting worse and effecting vision. Looking through medical records pt has a hx of Bell's Palsy   Headache: Diagnosed with Bell's palsy since 2015. Had some headaches prior to Bell's palsy but worsened after Bell's palsy. Diagnosed with migraines by neuro - Dr. Terrace Arabia. Has not been back to neuro in several years. Was self medicating with excedrin 2-3 times per week. Less frequent HA when sleeping more.  Schedule change last Saturday  2 days ago (prior 3pm to midnight), now 8am to 7pm shift - 4 days per week.  Less sleep with new shift (prior bedtime 3-5am).  Still going to bed at 3-5 am, slept for 1-3 hours., then worked for 11 hour in front of a computer.  Headaches started to get worse over past 6 weeks.  Moving, working full time. With shift change - focus on R Eye comes and goes - past few days. Woke up with acute change in vision at 8:12 am today. Only could see shapes. Called out of work.  Vision improved, but still off and on blurriness.   Headache currently - vomited once today. Also feels like R arm, R face and R leg feels weak since this morning at about 8. Has residual weakness from Bells palsy in face.  Now c/o pain in R jaw and face. Past 2 weeks. "complex headache in past", but no known hx of CVA.  Tx: none.   No corrective lenses.   Patient Active Problem List   Diagnosis Date Noted  . Essential hypertension, benign 12/21/2017  . Tachycardia 12/21/2017  . Other abnormal glucose 12/21/2017  . Major depressive disorder, recurrent severe without psychotic features (HCC) 05/20/2017  . MDD (major depressive disorder), recurrent episode, severe (HCC) 05/20/2017  . Right-sided Bell's palsy 02/07/2014  . Shingles 02/07/2014    Past Medical History:  Diagnosis Date  . Bell palsy   . Bell's palsy   . Hypertension    Past Surgical History:  Procedure Laterality Date  . WISDOM TOOTH EXTRACTION     Allergies  Allergen Reactions  . Pollen Extract Itching   Prior to Admission medications   Medication Sig Start Date End Date Taking? Authorizing Provider  amLODipine (NORVASC) 5 MG tablet TAKE 1 TABLET BY MOUTH ONCE DAILY 02/27/18  Yes Dorothyann Peng, MD  fluticasone [facility]) 50 MCG/ACT nasal spray Place 2 sprays into both nostrils daily. 06/27/17  Yes Benjiman Core D, PA-C  Levonorgestrel-Ethinyl Estrad (VIENVA PO) Take by mouth.   Yes [provider]   Social History   Socioeconomic History  . Marital status: Single    Spouse name: Not on file  . Number of children: 0  . Years of education: Some colle  . Highest education level: Not on file  Occupational History  . Occupation: Personal assistant - Clinical biochemist  Social Needs  . Financial resource strain: Not on file  . Food insecurity:    Worry: Not on file    Inability: Not on file  . Transportation needs:    Medical: Not on file    Non-medical: Not on file  Tobacco Use  . Smoking status: Former Smoker    Packs/day: 0.25    Years: 2.00    Pack  years: 0.50    Types: E-cigarettes    Last attempt to quit: 05/15/2017    Years since quitting: 0.7  . Smokeless tobacco: Never Used  Substance and Sexual Activity  . Alcohol use: Not Currently    Alcohol/week: 0.0 standard drinks    Comment: 1/2 bottle liquor in one day for the week  . Drug use: No  . Sexual activity: Not Currently  Lifestyle  . Physical activity:    Days per week: Not on file    Minutes per session: Not on file  . Stress: Not on file  Relationships  . Social connections:    Talks on phone: Not on file    Gets together: Not on file    Attends religious service: Not on file    Active member of club or organization: Not on file    Attends meetings of clubs or  organizations: Not on file    Relationship status: Not on file  . Intimate partner violence:    Fear of current or ex partner: Not on file    Emotionally abused: Not on file    Physically abused: Not on file    Forced sexual activity: Not on file  Other Topics Concern  . Not on file  Social History Narrative   Right handed.   Lives at home with roommates.   Caffeine occasionally - not daily.    Review of Systems    Objective:   Physical Exam Vitals signs reviewed.  Constitutional:      Appearance: She is well-developed.  HENT:     Head: Normocephalic and atraumatic.  Eyes:     Conjunctiva/sclera: Conjunctivae normal.     Pupils: Pupils are equal, round, and reactive to light.  Neck:     Vascular: No carotid bruit.  Cardiovascular:     Rate and Rhythm: Normal rate and regular rhythm.     Heart sounds: Normal heart sounds.  Pulmonary:     Effort: Pulmonary effort is normal.     Breath sounds: Normal breath sounds.  Abdominal:     Palpations: Abdomen is soft. There is no pulsatile mass.     Tenderness: There is no abdominal tenderness.  Skin:    General: Skin is warm and dry.  Neurological:     Mental Status: She is alert and oriented to person, place, and time.     GCS: GCS eye subscore is 4. GCS verbal subscore is 5. GCS motor subscore is 6.     Cranial Nerves: Facial asymmetry (Decreased smile on the right, minimal decreased forehead movement on the right.) present.     Motor: No weakness (No appreciable difference in right versus left arm/grip strength or leg strength.) or pronator drift.     Gait: Gait normal.  Psychiatric:        Behavior: Behavior normal.    Vitals:   02/27/18 1645  BP: 118/90  Pulse: 86  Resp: 14  Temp: 98.8 F (37.1 C)  TempSrc: Oral  SpO2: 96%  Weight: 278 lb 9.6 oz (126.4 kg)  Height: 5\' 7"  (1.702 m)     Visual Acuity Screening   Right eye Left eye Both eyes  Without correction: 20/40 20/25 20/25   With correction:          Assessment & Plan:   Stacy Wang is a 23 y.o. female Nonintractable headache, unspecified chronicity pattern, unspecified headache type  Blurred vision, right eye  Subjective weakness  History of Bell's palsy  Recent increase in  headaches likely related to sleep pattern and change in job.  Worsening of headache with reported vision changes this morning around 8 AM, and subjective right-sided weakness.  I do not appreciate focal weakness on exam, and facial movement asymmetry reportedly stable from previous Bell's palsy diagnosis.  Suspected migraine with vomiting earlier.  -Decided on further evaluation through emergency room, possible migraine treatment at that time and decision on neuroimaging.  Based on vision screen in office and no focal weakness appreciated, patient plans on private vehicle transport.  911 precautions if any acute changes in route.  Understanding expressed.  -Did recommend follow-up with her neurologist for ongoing migraine treatment.  If new referral needed can place as well.  We can see her in follow-up from ER if needed.     No orders of the defined types were placed in this encounter.  Patient Instructions   I do think that the recent increase in headaches are related to decreased sleep.  That can cause flare of migraine headaches.  However I am concerned about the worsening symptoms since 8 this morning with your right vision changes and right-sided weakness.  I do not appreciate any weakness on exam at present time but do think that should be evaluated further through the emergency room tonight.   If any changes in symptoms on the way to the ER, pull over and call 911.   After ER visit - I would recommend contacting your neurologist to discuss headache treatment further.    General Headache Without Cause A headache is pain or discomfort that is felt around the head or neck area. There are many causes and types of headaches. In some cases, the cause may  not be found. Follow these instructions at home: Watch your condition for any changes. Let your doctor know about them. Take these steps to help with your condition: Managing pain      Take over-the-counter and prescription medicines only as told by your doctor.  Lie down in a dark, quiet room when you have a headache.  If told, put ice on your head and neck area: ? Put ice in a plastic bag. ? Place a towel between your skin and the bag. ? Leave the ice on for 20 minutes, 2-3 times per day.  If told, put heat on the affected area. Use the heat source that your doctor recommends, such as a moist heat pack or a heating pad. ? Place a towel between your skin and the heat source. ? Leave the heat on for 20-30 minutes. ? Remove the heat if your skin turns bright red. This is very important if you are unable to feel pain, heat, or cold. You may have a greater risk of getting burned.  Keep lights dim if bright lights bother you or make your headaches worse. Eating and drinking  Eat meals on a regular schedule.  If you drink alcohol: ? Limit how much you use to:  0-1 drink a day for women.  0-2 drinks a day for men. ? Be aware of how much alcohol is in your drink. In the [place] one drink equals one 12 oz bottle of beer (355 mL), one 5 oz glass of wine (148 mL), or one 1 oz glass of hard liquor (44 mL).  Stop drinking caffeine, or reduce how much caffeine you drink. General instructions   Keep a journal to find out if certain things bring on headaches. For example, write down: ? What you eat and drink. ?  How much sleep you get. ? Any change to your diet or medicines.  Get a massage or try other ways to relax.  Limit stress.  Sit up straight. Do not tighten (tense) your muscles.  Do not use any products that contain nicotine or tobacco. This includes cigarettes, e-cigarettes, and chewing tobacco. If you need help quitting, ask your doctor.  Exercise regularly as told by your  doctor.  Get enough sleep. This often means 7-9 hours of sleep each night.  Keep all follow-up visits as told by your doctor. This is important. Contact a doctor if:  Your symptoms are not helped by medicine.  You have a headache that feels different than the other headaches.  You feel sick to your stomach (nauseous) or you throw up (vomit).  You have a fever. Get help right away if:  Your headache gets very bad quickly.  Your headache gets worse after a lot of physical activity.  You keep throwing up.  You have a stiff neck.  You have trouble seeing.  You have trouble speaking.  You have pain in the eye or ear.  Your muscles are weak or you lose muscle control.  You lose your balance or have trouble walking.  You feel like you will pass out (faint) or you pass out.  You are mixed up (confused).  You have a seizure. Summary  A headache is pain or discomfort that is felt around the head or neck area.  There are many causes and types of headaches. In some cases, the cause may not be found.  Keep a journal to help find out what causes your headaches. Watch your condition for any changes. Let your doctor know about them.  Contact a doctor if you have a headache that is different from usual, or if your headache is not helped by medicine.  Get help right away if your headache gets very bad, you throw up, you have trouble seeing, you lose your balance, or you have a seizure. This information is not intended to replace advice given to you by your health care provider. Make sure you discuss any questions you have with your health care provider. Document Released: 10/07/2007 Document Revised: 07/18/2017 Document Reviewed: 07/18/2017 Elsevier Interactive Patient Education  [employer].    If you have lab work done today you will be contacted with your lab results within the next 2 weeks.  If you have not heard from [place] then please contact us. The fastest way to get  your results is to register for My Chart.   IF you received an x-ray today, you will receive an invoice from [facility]. Please contact [facility] at [phone] with questions or concerns regarding your invoice.   IF you received labwork today, you will receive an invoice from [place]. Please contact [facility] at [phone] with questions or concerns regarding your invoice.   Our billing staff will not be able to assist you with questions regarding bills from these companies.  You will be contacted with the lab results as soon as they are available. The fastest way to get your results is to activate your My Chart account. Instructions are located on the last page of this paperwork. If you have not heard from [place] regarding the results in 2 weeks, please contact this office.       Signed,   Meredith Staggers, MD Primary Care at [facility].  02/27/18 6:24 PM

## 2018-02-28 DIAGNOSIS — F332 Major depressive disorder, recurrent severe without psychotic features: Secondary | ICD-10-CM | POA: Diagnosis not present

## 2018-02-28 DIAGNOSIS — F4311 Post-traumatic stress disorder, acute: Secondary | ICD-10-CM | POA: Diagnosis not present

## 2018-02-28 DIAGNOSIS — Z634 Disappearance and death of family member: Secondary | ICD-10-CM | POA: Diagnosis not present

## 2018-02-28 NOTE — ED Notes (Signed)
Pt called for vitals, no answer 

## 2018-02-28 NOTE — ED Notes (Signed)
Pt called multiple times, no answer 

## 2018-03-06 DIAGNOSIS — Z634 Disappearance and death of family member: Secondary | ICD-10-CM | POA: Diagnosis not present

## 2018-03-06 DIAGNOSIS — F4311 Post-traumatic stress disorder, acute: Secondary | ICD-10-CM | POA: Diagnosis not present

## 2018-03-06 DIAGNOSIS — F332 Major depressive disorder, recurrent severe without psychotic features: Secondary | ICD-10-CM | POA: Diagnosis not present

## 2018-03-08 DIAGNOSIS — F4312 Post-traumatic stress disorder, chronic: Secondary | ICD-10-CM | POA: Diagnosis not present

## 2018-03-08 DIAGNOSIS — Z634 Disappearance and death of family member: Secondary | ICD-10-CM | POA: Diagnosis not present

## 2018-03-08 DIAGNOSIS — F332 Major depressive disorder, recurrent severe without psychotic features: Secondary | ICD-10-CM | POA: Diagnosis not present

## 2018-03-09 DIAGNOSIS — I10 Essential (primary) hypertension: Secondary | ICD-10-CM | POA: Diagnosis not present

## 2018-03-09 DIAGNOSIS — R739 Hyperglycemia, unspecified: Secondary | ICD-10-CM | POA: Diagnosis not present

## 2018-03-09 DIAGNOSIS — G43709 Chronic migraine without aura, not intractable, without status migrainosus: Secondary | ICD-10-CM | POA: Diagnosis not present

## 2018-03-13 DIAGNOSIS — Z634 Disappearance and death of family member: Secondary | ICD-10-CM | POA: Diagnosis not present

## 2018-03-13 DIAGNOSIS — F332 Major depressive disorder, recurrent severe without psychotic features: Secondary | ICD-10-CM | POA: Diagnosis not present

## 2018-03-13 DIAGNOSIS — F4311 Post-traumatic stress disorder, acute: Secondary | ICD-10-CM | POA: Diagnosis not present

## 2018-03-15 DIAGNOSIS — E119 Type 2 diabetes mellitus without complications: Secondary | ICD-10-CM | POA: Insufficient documentation

## 2018-03-20 DIAGNOSIS — F332 Major depressive disorder, recurrent severe without psychotic features: Secondary | ICD-10-CM | POA: Diagnosis not present

## 2018-03-20 DIAGNOSIS — Z634 Disappearance and death of family member: Secondary | ICD-10-CM | POA: Diagnosis not present

## 2018-03-20 DIAGNOSIS — F4311 Post-traumatic stress disorder, acute: Secondary | ICD-10-CM | POA: Diagnosis not present

## 2018-03-25 ENCOUNTER — Other Ambulatory Visit: Payer: Self-pay

## 2018-03-25 ENCOUNTER — Encounter: Payer: Self-pay | Admitting: Osteopathic Medicine

## 2018-03-25 ENCOUNTER — Ambulatory Visit: Payer: BC Managed Care – PPO | Admitting: Osteopathic Medicine

## 2018-03-25 VITALS — BP 108/76 | HR 85 | Temp 98.2°F | Resp 16 | Ht 66.5 in | Wt 284.0 lb

## 2018-03-25 DIAGNOSIS — R197 Diarrhea, unspecified: Secondary | ICD-10-CM | POA: Diagnosis not present

## 2018-03-25 DIAGNOSIS — R112 Nausea with vomiting, unspecified: Secondary | ICD-10-CM | POA: Diagnosis not present

## 2018-03-25 MED ORDER — PROMETHAZINE HCL 25 MG/ML IJ SOLN
25.0000 mg | Freq: Once | INTRAMUSCULAR | Status: AC
  Administered 2018-03-25: 25 mg via INTRAMUSCULAR

## 2018-03-25 MED ORDER — DIPHENOXYLATE-ATROPINE 2.5-0.025 MG PO TABS: ORAL_TABLET | ORAL | 0 refills | Status: DC

## 2018-03-25 MED ORDER — PROMETHAZINE HCL 25 MG/ML IJ SOLN: 25.0000 mg | Freq: Once | INTRAMUSCULAR | 0 refills | Status: DC

## 2018-03-25 MED ORDER — PROMETHAZINE HCL 25 MG PO TABS: 25.0000 mg | ORAL_TABLET | Freq: Four times a day (QID) | ORAL | 0 refills | Status: DC | PRN

## 2018-03-25 MED ORDER — LOPERAMIDE HCL 2 MG PO TABS: 2.0000 mg | ORAL_TABLET | Freq: Four times a day (QID) | ORAL | 0 refills | Status: DC | PRN

## 2018-03-25 NOTE — Addendum Note (Signed)
Addended by: Lorenda Hatchet R on: 03/25/2018 12:45 PM   Modules accepted: Orders

## 2018-03-25 NOTE — Addendum Note (Signed)
Addended by: Lisbeth Renshaw, Margret Moat HUA on: 03/25/2018 12:47 PM   Modules accepted: Orders

## 2018-03-25 NOTE — Progress Notes (Signed)
HPI: Stacy Wang is a 23 y.o. female who  has a past medical history of Bell palsy, Bell's palsy, Hypertension, and Migraine.  she presents to [place] at [facility] today, 03/25/18,  for chief complaint of: Chief Complaint  Patient presents with  . Diarrhea        . Context: No recent travel, no fever, no respiratory symptoms.  Denies possibility of pregnancy (same-sex partner) . Location/Quality/Duration: diarrhea x1 week, nausea/vomiting x2 days . Modifying factors: no OTC meds tried . Assoc signs/symptoms: upper abdominal pain . Taking birth control for irregular periods          Past medical, surgical, social and family history reviewed:  Patient Active Problem List   Diagnosis Date Noted  . Essential hypertension, benign 12/21/2017  . Tachycardia 12/21/2017  . Other abnormal glucose 12/21/2017  . Major depressive disorder, recurrent severe without psychotic features ([place]) 05/20/2017  . MDD (major depressive disorder), recurrent episode, severe ([place]) 05/20/2017  . Right-sided Bell's palsy 02/07/2014  . Shingles 02/07/2014    Past Surgical History:  Procedure Laterality Date  . WISDOM TOOTH EXTRACTION      Social History   Tobacco Use  . Smoking status: Former Smoker    Packs/day: 0.25    Years: 2.00    Pack years: 0.50    Types: E-cigarettes    Last attempt to quit: 05/15/2017    Years since quitting: 0.8  . Smokeless tobacco: Never Used  Substance Use Topics  . Alcohol use: Not Currently    Alcohol/week: 0.0 standard drinks    Comment: 1/2 bottle liquor in one day for the week    Family History  Problem Relation Age of Onset  . Depression Mother   . Diabetes Father   . Heart disease Father      Current medication list and allergy/intolerance information reviewed:    Current Outpatient Medications  Medication Sig Dispense Refill  . amLODipine (NORVASC) 5 MG tablet TAKE 1 TABLET BY MOUTH ONCE DAILY 90 tablet 0  . fluticasone  (FLONASE) 50 MCG/ACT nasal spray Place 2 sprays into both nostrils daily. 16 g 0  . Levonorgestrel-Ethinyl Estrad (VIENVA PO) Take by mouth.     No current facility-administered medications for this visit.     Allergies  Allergen Reactions  . Pollen Extract Itching      Review of Systems:  Constitutional:  No  fever, no chills, No recent illness, No unintentional weight changes. No significant fatigue.   HEENT: No  headache, no vision change, no hearing change, No sore throat, No  sinus pressure  Cardiac: No  chest pain, No  pressure, No palpitations  Respiratory:  No  shortness of breath. No  Cough  Gastrointestinal: +abdominal pain, +nausea, +vomiting,  No  blood in stool, +diarrhea  Musculoskeletal: No new myalgia/arthralgia  Skin: No  Rash  Neurologic: No  weakness, No  dizziness  Exam:  BP 108/76 (BP Location: Left Arm, Patient Position: Sitting, Cuff Size: Large)   Pulse 85   Temp 98.2 F (36.8 C) (Oral)   Resp 16   Ht 5' 6.5" (1.689 m)   Wt 284 lb (128.8 kg)   LMP 03/25/2018   SpO2 100%   BMI 45.15 kg/m   Constitutional: VS see above. General Appearance: alert, well-developed, well-nourished, NAD  Eyes: Normal lids and conjunctive, non-icteric sclera  Ears, Nose, Mouth, Throat: MMM, Normal external inspection ears/nares/mouth/lips/gums. TM normal bilaterally. Pharynx/tonsils no erythema, no exudate. Nasal mucosa normal.   Neck: No  masses, trachea midline. No tenderness/mass appreciated. No lymphadenopathy  Respiratory: Normal respiratory effort. no wheeze, no rhonchi, no rales  Cardiovascular: S1/S2 normal, no murmur, no rub/gallop auscultated. RRR. No lower extremity edema.   Gastrointestinal: Nontender, no masses. Bowel sounds normal.  Musculoskeletal: Gait normal.   Neurological: Normal balance/coordination. No tremor.   Skin: warm, dry, intact.   Psychiatric: Normal judgment/insight. Normal mood and affect.         ASSESSMENT/PLAN:    Nausea vomiting and diarrhea - Plan: CBC with Differential/Platelet, Lipase, CMP14+EGFR   Meds ordered this encounter  Medications  . promethazine (PHENERGAN) 25 MG tablet    Sig: Take 1 tablet (25 mg total) by mouth every 6 (six) hours as needed for nausea or vomiting.    Dispense:  30 tablet    Refill:  0  . diphenoxylate-atropine (LOMOTIL) 2.5-0.025 MG tablet    Sig: One tablet by mouth 4 times a day as needed for diarrhea if imodium isn't helping    Dispense:  10 tablet    Refill:  0  . loperamide (IMODIUM A-D) 2 MG tablet    Sig: Take 1 tablet (2 mg total) by mouth 4 (four) times daily as needed for diarrhea or loose stools.    Dispense:  30 tablet    Refill:  0       Patient Instructions   Viral Gastroenteritis, Adult  Viral gastroenteritis is also known as the stomach flu. This condition is caused by various viruses. These viruses can be passed from person to person very easily (are very contagious). This condition may affect your stomach, small intestine, and large intestine. It can cause sudden watery diarrhea, fever, and vomiting. Diarrhea and vomiting can make you feel weak and cause you to become dehydrated. You may not be able to keep fluids down. Dehydration can make you tired and thirsty, cause you to have a dry mouth, and decrease how often you urinate. Older adults and people with other diseases or a weak immune system are at higher risk for dehydration. It is important to replace the fluids that you lose from diarrhea and vomiting. If you become severely dehydrated, you may need to get fluids through an IV tube. What are the causes? Gastroenteritis is caused by various viruses, including rotavirus and norovirus. Norovirus is the most common cause in adults. You can get sick by eating food, drinking water, or touching a surface contaminated with one of these viruses. You can also get sick from sharing utensils or other personal items with an infected person. What  increases the risk? This condition is more likely to develop in people:  Who have a weak defense system (immune system).  Who live with one or more children who are younger than 74 years old.  Who live in a nursing home.  Who go on cruise ships. What are the signs or symptoms? Symptoms of this condition start suddenly 1-2 days after exposure to a virus. Symptoms may last a few days or as long as a week. The most common symptoms are watery diarrhea and vomiting. Other symptoms include:  Fever.  Headache.  Fatigue.  Pain in the abdomen.  Chills.  Weakness.  Nausea.  Muscle aches.  Loss of appetite. How is this diagnosed? This condition is diagnosed with a medical history and physical exam. You may also have a stool test to check for viruses or other infections. How is this treated? This condition typically goes away on its own. The focus of treatment is  to restore lost fluids (rehydration). Your health care provider may recommend that you take an oral rehydration solution (ORS) to replace important salts and minerals (electrolytes) in your body. Severe cases of this condition may require giving fluids through an IV tube. Treatment may also include medicine to help with your symptoms. Follow these instructions at home:  Follow instructions from your health care provider about how to care for yourself at home. Follow these recommendations as told by your health care provider:  Take an ORS. This is a drink that is sold at pharmacies and retail stores.  Drink clear fluids in small amounts as you are able. Clear fluids include water, ice chips, diluted fruit juice, and low-calorie sports drinks.  Eat bland, easy-to-digest foods in small amounts as you are able. These foods include bananas, applesauce, rice, lean meats, toast, and crackers.  Avoid fluids that contain a lot of sugar or caffeine, such as energy drinks, sports drinks, and soda.  Avoid alcohol.  Avoid spicy or  fatty foods. General instructions  Drink enough fluid to keep your urine clear or pale yellow.  Wash your hands often. If soap and water are not available, use hand sanitizer.  Make sure that all people in your household wash their hands well and often.  Take over-the-counter and prescription medicines only as told by your health care provider.  Rest at home while you recover.  Watch your condition for any changes.  Take a warm bath to relieve any burning or pain from frequent diarrhea episodes.  Keep all follow-up visits as told by your health care provider. This is important. Contact a health care provider if:  You cannot keep fluids down.  Your symptoms get worse.  You have new symptoms.  You feel light-headed or dizzy.  You have muscle cramps. Get help right away if:  You have chest pain.  You feel extremely weak or you faint.  You see blood in your vomit.  Your vomit looks like coffee grounds.  You have bloody or black stools or stools that look like tar.  You have a severe headache, a stiff neck, or both.  You have a rash.  You have severe pain, cramping, or bloating in your abdomen.  You have trouble breathing or you are breathing very quickly.  Your heart is beating very quickly.  Your skin feels cold and clammy.  You feel confused.  You have pain when you urinate.  You have signs of dehydration, such as: ? Dark urine, very little urine, or no urine. ? Cracked lips. ? Dry mouth. ? Sunken eyes. ? Sleepiness. ? Weakness. This information is not intended to replace advice given to you by your health care provider. Make sure you discuss any questions you have with your health care provider. Document Released: 12/28/2004 Document Revised: 08/12/2016 Document Reviewed: 09/03/2014 Elsevier Interactive Patient Education  [employer].     If you have lab work done today you will be contacted with your lab results within the next 2 weeks.   If you have not heard from [place] then please contact us. The fastest way to get your results is to register for My Chart.   IF you received an x-ray today, you will receive an invoice from [facility]. Please contact [facility] at [phone] with questions or concerns regarding your invoice.   IF you received labwork today, you will receive an invoice from [place]. Please contact [facility] at [phone] with questions or concerns regarding your invoice.  Our billing staff will not be able to assist you with questions regarding bills from these companies.  You will be contacted with the lab results as soon as they are available. The fastest way to get your results is to activate your My Chart account. Instructions are located on the last page of this paperwork. If you have not heard from [place] regarding the results in 2 weeks, please contact this office.         Visit summary with medication list and pertinent instructions was printed for patient to review. All questions at time of visit were answered - patient instructed to contact office with any additional concerns. ER/RTC precautions were reviewed with the patient.   Follow-up plan: Return if symptoms worsen or fail to improve.   Please note: voice recognition software was used to produce this document, and typos may escape review. Please contact Dr. Sheppard Coil for any needed clarifications.

## 2018-03-25 NOTE — Patient Instructions (Addendum)
Viral Gastroenteritis, Adult  Viral gastroenteritis is also known as the stomach flu. This condition is caused by various viruses. These viruses can be passed from person to person very easily (are very contagious). This condition may affect your stomach, small intestine, and large intestine. It can cause sudden watery diarrhea, fever, and vomiting. Diarrhea and vomiting can make you feel weak and cause you to become dehydrated. You may not be able to keep fluids down. Dehydration can make you tired and thirsty, cause you to have a dry mouth, and decrease how often you urinate. Older adults and people with other diseases or a weak immune system are at higher risk for dehydration. It is important to replace the fluids that you lose from diarrhea and vomiting. If you become severely dehydrated, you may need to get fluids through an IV tube. What are the causes? Gastroenteritis is caused by various viruses, including rotavirus and norovirus. Norovirus is the most common cause in adults. You can get sick by eating food, drinking water, or touching a surface contaminated with one of these viruses. You can also get sick from sharing utensils or other personal items with an infected person. What increases the risk? This condition is more likely to develop in people:  Who have a weak defense system (immune system).  Who live with one or more children who are younger than 67 years old.  Who live in a nursing home.  Who go on cruise ships. What are the signs or symptoms? Symptoms of this condition start suddenly 1-2 days after exposure to a virus. Symptoms may last a few days or as long as a week. The most common symptoms are watery diarrhea and vomiting. Other symptoms include:  Fever.  Headache.  Fatigue.  Pain in the abdomen.  Chills.  Weakness.  Nausea.  Muscle aches.  Loss of appetite. How is this diagnosed? This condition is diagnosed with a medical history and physical exam. You  may also have a stool test to check for viruses or other infections. How is this treated? This condition typically goes away on its own. The focus of treatment is to restore lost fluids (rehydration). Your health care provider may recommend that you take an oral rehydration solution (ORS) to replace important salts and minerals (electrolytes) in your body. Severe cases of this condition may require giving fluids through an IV tube. Treatment may also include medicine to help with your symptoms. Follow these instructions at home:  Follow instructions from your health care provider about how to care for yourself at home. Follow these recommendations as told by your health care provider:  Take an ORS. This is a drink that is sold at pharmacies and retail stores.  Drink clear fluids in small amounts as you are able. Clear fluids include water, ice chips, diluted fruit juice, and low-calorie sports drinks.  Eat bland, easy-to-digest foods in small amounts as you are able. These foods include bananas, applesauce, rice, lean meats, toast, and crackers.  Avoid fluids that contain a lot of sugar or caffeine, such as energy drinks, sports drinks, and soda.  Avoid alcohol.  Avoid spicy or fatty foods. General instructions  Drink enough fluid to keep your urine clear or pale yellow.  Wash your hands often. If soap and water are not available, use hand sanitizer.  Make sure that all people in your household wash their hands well and often.  Take over-the-counter and prescription medicines only as told by your health care provider.  Rest at  home while you recover.  Watch your condition for any changes.  Take a warm bath to relieve any burning or pain from frequent diarrhea episodes.  Keep all follow-up visits as told by your health care provider. This is important. Contact a health care provider if:  You cannot keep fluids down.  Your symptoms get worse.  You have new symptoms.  You  feel light-headed or dizzy.  You have muscle cramps. Get help right away if:  You have chest pain.  You feel extremely weak or you faint.  You see blood in your vomit.  Your vomit looks like coffee grounds.  You have bloody or black stools or stools that look like tar.  You have a severe headache, a stiff neck, or both.  You have a rash.  You have severe pain, cramping, or bloating in your abdomen.  You have trouble breathing or you are breathing very quickly.  Your heart is beating very quickly.  Your skin feels cold and clammy.  You feel confused.  You have pain when you urinate.  You have signs of dehydration, such as: ? Dark urine, very little urine, or no urine. ? Cracked lips. ? Dry mouth. ? Sunken eyes. ? Sleepiness. ? Weakness. This information is not intended to replace advice given to you by your health care provider. Make sure you discuss any questions you have with your health care provider. Document Released: 12/28/2004 Document Revised: 08/12/2016 Document Reviewed: 09/03/2014 Elsevier Interactive Patient Education  [employer].     If you have lab work done today you will be contacted with your lab results within the next 2 weeks.  If you have not heard from [place] then please contact us. The fastest way to get your results is to register for My Chart.   IF you received an x-ray today, you will receive an invoice from [facility]. Please contact [facility] at [phone] with questions or concerns regarding your invoice.   IF you received labwork today, you will receive an invoice from [facility]. Please contact [facility] at [phone] with questions or concerns regarding your invoice.   Our billing staff will not be able to assist you with questions regarding bills from these companies.  You will be contacted with the lab results as soon as they are available. The fastest way to get your results is to activate your My Chart  account. Instructions are located on the last page of this paperwork. If you have not heard from [place] regarding the results in 2 weeks, please contact this office.

## 2018-03-25 NOTE — Addendum Note (Signed)
Addended by: Lorenda Hatchet R on: 03/25/2018 12:44 PM   Modules accepted: Orders

## 2018-03-26 LAB — CBC WITH DIFFERENTIAL/PLATELET
BASOS ABS: 0 10*3/uL (ref 0.0–0.2)
BASOS: 1 %
EOS (ABSOLUTE): 0.1 10*3/uL (ref 0.0–0.4)
Eos: 1 %
Hematocrit: 38.5 % (ref 34.0–46.6)
Hemoglobin: 12.6 g/dL (ref 11.1–15.9)
Immature Grans (Abs): 0 10*3/uL (ref 0.0–0.1)
Immature Granulocytes: 0 %
LYMPHS ABS: 2.1 10*3/uL (ref 0.7–3.1)
LYMPHS: 33 %
MCH: 26.5 pg — ABNORMAL LOW (ref 26.6–33.0)
MCHC: 32.7 g/dL (ref 31.5–35.7)
MCV: 81 fL (ref 79–97)
Monocytes Absolute: 0.4 10*3/uL (ref 0.1–0.9)
Monocytes: 6 %
NEUTROS ABS: 3.9 10*3/uL (ref 1.4–7.0)
NEUTROS PCT: 59 %
PLATELETS: 387 10*3/uL (ref 150–450)
RBC: 4.75 x10E6/uL (ref 3.77–5.28)
RDW: 13.9 % (ref 11.7–15.4)
WBC: 6.5 10*3/uL (ref 3.4–10.8)

## 2018-03-26 LAB — CMP14+EGFR
A/G RATIO: 1.4 (ref 1.2–2.2)
ALT: 11 IU/L (ref 0–32)
AST: 9 IU/L (ref 0–40)
Albumin: 3.8 g/dL — ABNORMAL LOW (ref 3.9–5.0)
Alkaline Phosphatase: 73 IU/L (ref 39–117)
BUN/Creatinine Ratio: 10 (ref 9–23)
BUN: 8 mg/dL (ref 6–20)
CALCIUM: 9.4 mg/dL (ref 8.7–10.2)
CHLORIDE: 103 mmol/L (ref 96–106)
CO2: 22 mmol/L (ref 20–29)
Creatinine, Ser: 0.82 mg/dL (ref 0.57–1.00)
GFR calc Af Amer: 116 mL/min/{1.73_m2} (ref >59)
GFR, EST NON AFRICAN AMERICAN: 100 mL/min/{1.73_m2} (ref >59)
GLOBULIN, TOTAL: 2.7 g/dL (ref 1.5–4.5)
Glucose: 143 mg/dL — ABNORMAL HIGH (ref 65–99)
Potassium: 4.6 mmol/L (ref 3.5–5.2)
Sodium: 141 mmol/L (ref 134–144)
Total Protein: 6.5 g/dL (ref 6.0–8.5)

## 2018-03-26 LAB — LIPASE: LIPASE: 16 U/L (ref 14–72)

## 2018-03-27 DIAGNOSIS — F4311 Post-traumatic stress disorder, acute: Secondary | ICD-10-CM | POA: Diagnosis not present

## 2018-03-27 DIAGNOSIS — Z634 Disappearance and death of family member: Secondary | ICD-10-CM | POA: Diagnosis not present

## 2018-03-27 DIAGNOSIS — F332 Major depressive disorder, recurrent severe without psychotic features: Secondary | ICD-10-CM | POA: Diagnosis not present

## 2018-04-03 DIAGNOSIS — F332 Major depressive disorder, recurrent severe without psychotic features: Secondary | ICD-10-CM | POA: Diagnosis not present

## 2018-04-03 DIAGNOSIS — Z634 Disappearance and death of family member: Secondary | ICD-10-CM | POA: Diagnosis not present

## 2018-04-03 DIAGNOSIS — F4311 Post-traumatic stress disorder, acute: Secondary | ICD-10-CM | POA: Diagnosis not present

## 2018-04-11 ENCOUNTER — Encounter: Payer: Self-pay | Admitting: Family Medicine

## 2018-04-13 DIAGNOSIS — F332 Major depressive disorder, recurrent severe without psychotic features: Secondary | ICD-10-CM | POA: Diagnosis not present

## 2018-04-13 DIAGNOSIS — Z634 Disappearance and death of family member: Secondary | ICD-10-CM | POA: Diagnosis not present

## 2018-04-13 DIAGNOSIS — F4311 Post-traumatic stress disorder, acute: Secondary | ICD-10-CM | POA: Diagnosis not present

## 2018-04-24 DIAGNOSIS — F4311 Post-traumatic stress disorder, acute: Secondary | ICD-10-CM | POA: Diagnosis not present

## 2018-04-24 DIAGNOSIS — Z634 Disappearance and death of family member: Secondary | ICD-10-CM | POA: Diagnosis not present

## 2018-04-24 DIAGNOSIS — F332 Major depressive disorder, recurrent severe without psychotic features: Secondary | ICD-10-CM | POA: Diagnosis not present

## 2018-04-27 ENCOUNTER — Encounter (HOSPITAL_COMMUNITY): Payer: Self-pay

## 2018-04-27 ENCOUNTER — Ambulatory Visit (HOSPITAL_COMMUNITY)
Admission: EM | Admit: 2018-04-27 | Discharge: 2018-04-27 | Disposition: A | Discharge status: Home / Self Care | Payer: BC Managed Care – PPO | Attending: Internal Medicine | Admitting: Internal Medicine

## 2018-04-27 ENCOUNTER — Other Ambulatory Visit: Payer: Self-pay

## 2018-04-27 DIAGNOSIS — J45990 Exercise induced bronchospasm: Secondary | ICD-10-CM | POA: Diagnosis not present

## 2018-04-27 DIAGNOSIS — I10 Essential (primary) hypertension: Secondary | ICD-10-CM

## 2018-04-27 DIAGNOSIS — E669 Obesity, unspecified: Secondary | ICD-10-CM | POA: Diagnosis not present

## 2018-04-27 DIAGNOSIS — R87610 Atypical squamous cells of undetermined significance on cytologic smear of cervix (ASC-US): Secondary | ICD-10-CM | POA: Insufficient documentation

## 2018-04-27 DIAGNOSIS — R7303 Prediabetes: Secondary | ICD-10-CM | POA: Diagnosis not present

## 2018-04-27 DIAGNOSIS — E282 Polycystic ovarian syndrome: Secondary | ICD-10-CM | POA: Insufficient documentation

## 2018-04-27 LAB — GLUCOSE, CAPILLARY: Glucose-Capillary: 90 mg/dL (ref 70–99)

## 2018-04-27 MED ORDER — ALBUTEROL SULFATE HFA 108 (90 BASE) MCG/ACT IN AERS: 1.0000 | INHALATION_SPRAY | Freq: Four times a day (QID) | RESPIRATORY_TRACT | 0 refills | Status: DC | PRN

## 2018-04-27 NOTE — ED Triage Notes (Signed)
Patient presents to Urgent Care with complaints of dizziness and intermittent chest tightness since 4 days ago. Patient states she did an e-visit and was told to come be evaluated, pt has not taken any meds pta. Pt states she was also recently diagnosed with DM and has been trying to exercise more and eat less sugar, chest tightness is worse with exercise and better when lying down.

## 2018-04-27 NOTE — ED Provider Notes (Signed)
[facility]    CSN: [id number] Arrival date & time: 04/27/18  1129     History   Chief Complaint Chief Complaint  Patient presents with  . Dizziness    HPI Stacy Wang is a 23 y.o. female with a history of hypertension-controlled, obesity comes to urgent care with complaints of intermittent precordial chest pain of about 1 week duration.  Pain is precordial, severe (10-10) on occasion, aggravated by exercise.  Patient symptoms usually follows physical activity and is preceded by shortness of breath, chest tightness and subsequently chest pain.  She admits to having wheezing.   She was recently told that she was prediabetic and needs to lose weight for better blood glucose control.  She has been exercising over the past week or so.  Chest pain started at that time.  She denies diaphoresis, dizziness, near syncope or syncopal episodes.    Past Medical History:  Diagnosis Date  . Bell palsy   . Bell's palsy   . Hypertension   . Migraine     Patient Active Problem List   Diagnosis Date Noted  . Essential hypertension, benign 12/21/2017  . Tachycardia 12/21/2017  . Other abnormal glucose 12/21/2017  . Major depressive disorder, recurrent severe without psychotic features (HCC) 05/20/2017  . MDD (major depressive disorder), recurrent episode, severe (HCC) 05/20/2017  . Right-sided Bell's palsy 02/07/2014  . Shingles 02/07/2014    Past Surgical History:  Procedure Laterality Date  . WISDOM TOOTH EXTRACTION      OB History   No obstetric history on file.      Home Medications    Prior to Admission medications   Medication Sig Start Date End Date Taking? Authorizing Provider  propranolol (INDERAL) 60 MG tablet Take 60 mg by mouth 3 (three) times daily.   Yes [provider]  amLODipine (NORVASC) 5 MG tablet TAKE 1 TABLET BY MOUTH ONCE DAILY 02/27/18   Dorothyann PengSanders, Robyn, MD  diphenoxylate-atropine (LOMOTIL) 2.5-0.025 MG tablet One tablet by mouth 4  times a day as needed for diarrhea if imodium isn't helping 03/25/18   Sunnie NielsenAlexander, Natalie, DO  fluticasone [facility]FLONASE) 50 MCG/ACT nasal spray Place 2 sprays into both nostrils daily. 06/27/17   Benjiman CoreWiseman, Brittany D, PA-C  Levonorgestrel-Ethinyl Estrad (VIENVA PO) Take by mouth.    [provider]  loperamide (IMODIUM A-D) 2 MG tablet Take 1 tablet (2 mg total) by mouth 4 (four) times daily as needed for diarrhea or loose stools. 03/25/18   Sunnie NielsenAlexander, Natalie, DO  promethazine (PHENERGAN) 25 MG tablet Take 1 tablet (25 mg total) by mouth every 6 (six) hours as needed for nausea or vomiting. 03/25/18   Sunnie NielsenAlexander, Natalie, DO    Family History Family History  Problem Relation Age of Onset  . Depression Mother   . Diabetes Father   . Heart disease Father     Social History Social History   Tobacco Use  . Smoking status: Former Smoker    Packs/day: 0.25    Years: 2.00    Pack years: 0.50    Types: E-cigarettes    Last attempt to quit: 05/15/2017    Years since quitting: 0.9  . Smokeless tobacco: Never Used  Substance Use Topics  . Alcohol use: Not Currently    Alcohol/week: 0.0 standard drinks    Comment: 1/2 bottle liquor in one day for the week  . Drug use: No     Allergies   Pollen extract   Review of Systems Review of Systems  Constitutional: Negative for fatigue and fever.  HENT: Negative.   Eyes: Negative.   Respiratory: Positive for cough, chest tightness, shortness of breath and wheezing.   Cardiovascular: Positive for chest pain. Negative for palpitations and leg swelling.  Gastrointestinal: Negative.   Genitourinary: Negative for difficulty urinating, dysuria, frequency and urgency.  Musculoskeletal: Negative.   Skin: Negative for pallor, rash and wound.  Allergic/Immunologic: Positive for environmental allergies.  Neurological: Negative for dizziness, tremors, seizures, syncope, light-headedness, numbness and headaches.  Psychiatric/Behavioral: Negative for  agitation and confusion.     Physical Exam Triage Vital Signs ED Triage Vitals  Enc Vitals Group     BP 04/27/18 1147 121/79     Pulse --      Resp 04/27/18 1147 17     Temp 04/27/18 1147 98.5 F (36.9 C)     Temp Source 04/27/18 1147 Oral     SpO2 04/27/18 1147 100 %     Weight --      Height --      Head Circumference --      Peak Flow --      Pain Score 04/27/18 1145 4     Pain Loc --      Pain Edu? --      Excl. in GC? --    No data found.  Updated Vital Signs BP 121/79 (BP Location: Left Arm)   Temp 98.5 F (36.9 C) (Oral)   Resp 17   SpO2 100%   Visual Acuity Right Eye Distance:   Left Eye Distance:   Bilateral Distance:    Right Eye Near:   Left Eye Near:    Bilateral Near:     Physical Exam Vitals signs and nursing note reviewed.  Constitutional:      General: She is not in acute distress.    Appearance: She is not ill-appearing.  HENT:     Nose: No congestion or rhinorrhea.  Neck:     Musculoskeletal: Normal range of motion. No neck rigidity.  Cardiovascular:     Rate and Rhythm: Normal rate and regular rhythm.     Pulses: Normal pulses.     Heart sounds: No murmur. No friction rub. No gallop.   Pulmonary:     Effort: Pulmonary effort is normal. No respiratory distress.     Breath sounds: No stridor. No wheezing, rhonchi or rales.  Abdominal:     General: Bowel sounds are normal. There is no distension.     Tenderness: There is no abdominal tenderness. There is no guarding or rebound.  Musculoskeletal:        General: No swelling, tenderness or deformity.  Lymphadenopathy:     Cervical: No cervical adenopathy.  Skin:    General: Skin is warm.     Capillary Refill: Capillary refill takes less than 2 seconds.     Coloration: Skin is not jaundiced or pale.     Findings: No erythema.  Neurological:     Mental Status: She is alert and oriented to person, place, and time.      UC Treatments / Results  Labs (all labs ordered are listed,  but only abnormal results are displayed) Labs Reviewed - No data to display  EKG None  Radiology No results found.  Procedures Procedures (including critical care time)  Medications Ordered in UC Medications - No data to display  Initial Impression / Assessment and Plan / UC Course  I have reviewed the triage vital signs and the nursing notes.  Pertinent labs & imaging results that were available during my care of the patient were reviewed by me and considered in my medical decision making (see chart for details).     1.  Exercise-induced asthma with chest pain: EKG shows normal sinus rhythm Albuterol inhaler 15 minutes prior to exercising Patient is encouraged to continue exercising to lose weight  2.  Hypertension: Controlled Continue current medication regimen  3.  Prediabetes: Blood sugar is 90  4.  Obesity: Weight loss advised Final Clinical Impressions(s) / UC Diagnoses   Final diagnoses:  None   Discharge Instructions   None    ED Prescriptions    None     Controlled Substance Prescriptions [place] Controlled Substance Registry consulted? No   Merrilee Jansky, MD 04/27/18 1234

## 2018-04-30 DIAGNOSIS — F4311 Post-traumatic stress disorder, acute: Secondary | ICD-10-CM | POA: Diagnosis not present

## 2018-04-30 DIAGNOSIS — Z634 Disappearance and death of family member: Secondary | ICD-10-CM | POA: Diagnosis not present

## 2018-04-30 DIAGNOSIS — F332 Major depressive disorder, recurrent severe without psychotic features: Secondary | ICD-10-CM | POA: Diagnosis not present

## 2018-05-01 DIAGNOSIS — F4311 Post-traumatic stress disorder, acute: Secondary | ICD-10-CM | POA: Diagnosis not present

## 2018-05-01 DIAGNOSIS — Z634 Disappearance and death of family member: Secondary | ICD-10-CM | POA: Diagnosis not present

## 2018-05-01 DIAGNOSIS — F332 Major depressive disorder, recurrent severe without psychotic features: Secondary | ICD-10-CM | POA: Diagnosis not present

## 2018-05-07 DIAGNOSIS — F332 Major depressive disorder, recurrent severe without psychotic features: Secondary | ICD-10-CM | POA: Diagnosis not present

## 2018-05-07 DIAGNOSIS — Z634 Disappearance and death of family member: Secondary | ICD-10-CM | POA: Diagnosis not present

## 2018-05-07 DIAGNOSIS — F4311 Post-traumatic stress disorder, acute: Secondary | ICD-10-CM | POA: Diagnosis not present

## 2018-05-08 DIAGNOSIS — F4311 Post-traumatic stress disorder, acute: Secondary | ICD-10-CM | POA: Diagnosis not present

## 2018-05-08 DIAGNOSIS — F332 Major depressive disorder, recurrent severe without psychotic features: Secondary | ICD-10-CM | POA: Diagnosis not present

## 2018-05-08 DIAGNOSIS — Z634 Disappearance and death of family member: Secondary | ICD-10-CM | POA: Diagnosis not present

## 2018-05-14 DIAGNOSIS — Z634 Disappearance and death of family member: Secondary | ICD-10-CM | POA: Diagnosis not present

## 2018-05-14 DIAGNOSIS — F4311 Post-traumatic stress disorder, acute: Secondary | ICD-10-CM | POA: Diagnosis not present

## 2018-05-14 DIAGNOSIS — F332 Major depressive disorder, recurrent severe without psychotic features: Secondary | ICD-10-CM | POA: Diagnosis not present

## 2018-05-17 DIAGNOSIS — S93402A Sprain of unspecified ligament of left ankle, initial encounter: Secondary | ICD-10-CM | POA: Diagnosis not present

## 2018-05-17 DIAGNOSIS — I10 Essential (primary) hypertension: Secondary | ICD-10-CM | POA: Diagnosis not present

## 2018-05-22 DIAGNOSIS — Z634 Disappearance and death of family member: Secondary | ICD-10-CM | POA: Diagnosis not present

## 2018-05-22 DIAGNOSIS — F332 Major depressive disorder, recurrent severe without psychotic features: Secondary | ICD-10-CM | POA: Diagnosis not present

## 2018-05-22 DIAGNOSIS — F4311 Post-traumatic stress disorder, acute: Secondary | ICD-10-CM | POA: Diagnosis not present

## 2018-05-29 DIAGNOSIS — F4311 Post-traumatic stress disorder, acute: Secondary | ICD-10-CM | POA: Diagnosis not present

## 2018-05-29 DIAGNOSIS — F332 Major depressive disorder, recurrent severe without psychotic features: Secondary | ICD-10-CM | POA: Diagnosis not present

## 2018-05-29 DIAGNOSIS — Z634 Disappearance and death of family member: Secondary | ICD-10-CM | POA: Diagnosis not present

## 2018-05-30 DIAGNOSIS — F4311 Post-traumatic stress disorder, acute: Secondary | ICD-10-CM | POA: Diagnosis not present

## 2018-05-30 DIAGNOSIS — Z634 Disappearance and death of family member: Secondary | ICD-10-CM | POA: Diagnosis not present

## 2018-05-30 DIAGNOSIS — F332 Major depressive disorder, recurrent severe without psychotic features: Secondary | ICD-10-CM | POA: Diagnosis not present

## 2018-06-21 DIAGNOSIS — F332 Major depressive disorder, recurrent severe without psychotic features: Secondary | ICD-10-CM | POA: Diagnosis not present

## 2018-06-21 DIAGNOSIS — F4311 Post-traumatic stress disorder, acute: Secondary | ICD-10-CM | POA: Diagnosis not present

## 2018-06-21 DIAGNOSIS — Z634 Disappearance and death of family member: Secondary | ICD-10-CM | POA: Diagnosis not present

## 2018-07-05 DIAGNOSIS — F4311 Post-traumatic stress disorder, acute: Secondary | ICD-10-CM | POA: Diagnosis not present

## 2018-07-05 DIAGNOSIS — F332 Major depressive disorder, recurrent severe without psychotic features: Secondary | ICD-10-CM | POA: Diagnosis not present

## 2018-07-05 DIAGNOSIS — Z634 Disappearance and death of family member: Secondary | ICD-10-CM | POA: Diagnosis not present

## 2018-07-12 DIAGNOSIS — Z634 Disappearance and death of family member: Secondary | ICD-10-CM | POA: Diagnosis not present

## 2018-07-12 DIAGNOSIS — F4311 Post-traumatic stress disorder, acute: Secondary | ICD-10-CM | POA: Diagnosis not present

## 2018-07-12 DIAGNOSIS — F332 Major depressive disorder, recurrent severe without psychotic features: Secondary | ICD-10-CM | POA: Diagnosis not present

## 2018-07-17 ENCOUNTER — Encounter: Payer: BC Managed Care – PPO | Admitting: Internal Medicine

## 2018-07-19 DIAGNOSIS — F332 Major depressive disorder, recurrent severe without psychotic features: Secondary | ICD-10-CM | POA: Diagnosis not present

## 2018-07-19 DIAGNOSIS — Z634 Disappearance and death of family member: Secondary | ICD-10-CM | POA: Diagnosis not present

## 2018-07-19 DIAGNOSIS — F4311 Post-traumatic stress disorder, acute: Secondary | ICD-10-CM | POA: Diagnosis not present

## 2018-07-25 DIAGNOSIS — F4311 Post-traumatic stress disorder, acute: Secondary | ICD-10-CM | POA: Diagnosis not present

## 2018-07-25 DIAGNOSIS — Z634 Disappearance and death of family member: Secondary | ICD-10-CM | POA: Diagnosis not present

## 2018-07-25 DIAGNOSIS — F332 Major depressive disorder, recurrent severe without psychotic features: Secondary | ICD-10-CM | POA: Diagnosis not present

## 2018-07-26 ENCOUNTER — Encounter: Payer: Self-pay | Admitting: Nurse Practitioner

## 2018-07-26 ENCOUNTER — Ambulatory Visit (INDEPENDENT_AMBULATORY_CARE_PROVIDER_SITE_OTHER): Payer: BC Managed Care – PPO | Admitting: Nurse Practitioner

## 2018-07-26 ENCOUNTER — Other Ambulatory Visit: Payer: Self-pay

## 2018-07-26 VITALS — BP 122/78 | HR 93 | Temp 98.8°F | Ht 66.8 in | Wt 294.6 lb

## 2018-07-26 DIAGNOSIS — R7309 Other abnormal glucose: Secondary | ICD-10-CM

## 2018-07-26 DIAGNOSIS — E559 Vitamin D deficiency, unspecified: Secondary | ICD-10-CM | POA: Diagnosis not present

## 2018-07-26 DIAGNOSIS — Z0000 Encounter for general adult medical examination without abnormal findings: Secondary | ICD-10-CM

## 2018-07-26 DIAGNOSIS — Z6841 Body Mass Index (BMI) 40.0 and over, adult: Secondary | ICD-10-CM

## 2018-07-26 DIAGNOSIS — Z6836 Body mass index (BMI) 36.0-36.9, adult: Secondary | ICD-10-CM

## 2018-07-26 DIAGNOSIS — E6609 Other obesity due to excess calories: Secondary | ICD-10-CM | POA: Diagnosis not present

## 2018-07-26 DIAGNOSIS — I10 Essential (primary) hypertension: Secondary | ICD-10-CM

## 2018-07-26 NOTE — Progress Notes (Signed)
Subjective:     Patient ID: Stacy Wang , female    DOB: 1995-08-18 , 23 y.o.   MRN: [id number]   Chief Complaint  Patient presents with  . Annual Exam   The patient states she uses OCP (estrogen/progesterone) for birth control. Last LMP was Patient's last menstrual period was 07/21/2018.. Negative for Dysmenorrhea and Negative for Menorrhagia  Negative for: breast discharge, breast lump(s), breast pain and breast self exam.  Pertinent negatives include abnormal bleeding (hematology), anxiety, decreased libido, depression, difficulty falling sleep, dyspareunia, history of infertility, nocturia, sexual dysfunction, sleep disturbances, urinary incontinence, urinary urgency, vaginal discharge and vaginal itching. Diet cutting out sugary and high salt foods, trying to eat more complex carbs.  The patient states her exercise level is  2-3 times per week for 45 minutes at a time.      The patient's tobacco use is:  Social History   Tobacco Use  Smoking Status Former Smoker  . Packs/day: 0.25  . Years: 2.00  . Pack years: 0.50  . Types: E-cigarettes  . Quit date: 05/15/2017  . Years since quitting: 1.1  Smokeless Tobacco Never Used   She has been exposed to passive smoke. The patient's alcohol use is:  Social History   Substance and Sexual Activity  Alcohol Use Not Currently  . Alcohol/week: 0.0 standard drinks   Comment: 1/2 bottle liquor in one day for the week   Additional information: [place] Last pap 01/2017, next one scheduled for 3 years  HPI  HPI   Past Medical History:  Diagnosis Date  . Bell palsy   . Bell's palsy   . Hypertension   . Migraine      Family History  Problem Relation Age of Onset  . Depression Mother   . Diabetes Father   . Heart disease Father      Current Outpatient Medications:  .  albuterol (PROVENTIL HFA;VENTOLIN HFA) 108 (90 Base) MCG/ACT inhaler, Inhale 1-2 puffs into the lungs every 6 (six) hours as needed for wheezing or  shortness of breath., Disp: 1 Inhaler, Rfl: 0 .  fluticasone (FLONASE) 50 MCG/ACT nasal spray, Place 2 sprays into both nostrils daily., Disp: 16 g, Rfl: 0 .  Levonorgestrel-Ethinyl Estrad (VIENVA PO), Take by mouth., Disp: , Rfl:  .  propranolol (INDERAL) 60 MG tablet, Take 60 mg by mouth 3 (three) times daily., Disp: , Rfl:  .  amLODipine (NORVASC) 5 MG tablet, TAKE 1 TABLET BY MOUTH ONCE DAILY (Patient not taking: Reported on 07/26/2018), Disp: 90 tablet, Rfl: 0   Allergies  Allergen Reactions  . Pollen Extract Itching     Review of Systems  Constitutional: Negative.   HENT: Negative.   Eyes: Negative.   Respiratory: Negative.   Cardiovascular: Negative.   Gastrointestinal: Negative.   Endocrine: Negative.   Genitourinary: Negative.   Musculoskeletal: Negative.   Skin: Negative.   Allergic/Immunologic: Negative.   Neurological: Negative.  Negative for dizziness and headaches.  Hematological: Negative.   Psychiatric/Behavioral: Negative.      Today's Vitals   07/26/18 1450  BP: 122/78  Pulse: 93  Temp: 98.8 F (37.1 C)  TempSrc: Oral  Weight: 294 lb 9.6 oz (133.6 kg)  Height: 5' 6.8" (1.697 m)  PainSc: 0-No pain   Body mass index is 46.42 kg/m.   Objective:  Physical Exam Constitutional:      Appearance: Normal appearance. She is well-developed.  HENT:     Head: Normocephalic and atraumatic.     Right Ear:  Hearing, tympanic membrane, ear canal and external ear normal.     Left Ear: Hearing, tympanic membrane, ear canal and external ear normal.  Eyes:     General: Lids are normal.     Conjunctiva/sclera: Conjunctivae normal.     Pupils: Pupils are equal, round, and reactive to light.     Funduscopic exam:    Right eye: No papilledema.        Left eye: No papilledema.  Neck:     Musculoskeletal: Full passive range of motion without pain, normal range of motion and neck supple.     Thyroid: No thyroid mass.     Vascular: No carotid bruit.  Cardiovascular:      Rate and Rhythm: Normal rate and regular rhythm.     Pulses: Normal pulses.     Heart sounds: Normal heart sounds. No murmur.  Pulmonary:     Effort: Pulmonary effort is normal.     Breath sounds: Normal breath sounds.  Abdominal:     General: Abdomen is flat. Bowel sounds are normal.     Palpations: Abdomen is soft.  Musculoskeletal: Normal range of motion.        General: No swelling.     Right lower leg: No edema.     Left lower leg: No edema.  Skin:    General: Skin is warm and dry.     Capillary Refill: Capillary refill takes less than 2 seconds.  Neurological:     General: No focal deficit present.     Mental Status: She is alert and oriented to person, place, and time.     Cranial Nerves: No cranial nerve deficit.     Sensory: No sensory deficit.  Psychiatric:        Mood and Affect: Mood normal.        Behavior: Behavior normal.        Thought Content: Thought content normal.        Judgment: Judgment normal.         Assessment And Plan:     1. Health maintenance examination . Behavior modifications discussed and diet history reviewed.   . Pt will continue to exercise regularly and modify diet with low GI, plant based foods and decrease intake of processed foods.  . Recommend intake of daily multivitamin, Vitamin D, and calcium.  . Recommend for preventive screenings, as well as recommend immunizations that include  TDAP - BMP8+Anion Gap - Lipid Profile - CBC no Diff - Hemoglobin A1c - Vitamin D (25 hydroxy)  2. Essential hypertension, benign B/P is well controlled.  BMP ordered to check renal function.  The importance of regular exercise and dietary modification was stressed to the patient.  Stressed importance of losing ten percent of her body weight to help with B/P control.  The weight loss would help with decreasing cardiac and cancer risk as well.  - POCT Urinalysis Dipstick (81002) - POCT UA - Microalbumin - EKG 12-Lead  3. Other abnormal  glucose  Chronic, controlled  Continue with current medications  Encouraged to limit intake of sugary foods and drinks  Encouraged to increase physical activity to 150 minutes per week  4. Class 2 obesity due to excess calories without serious comorbidity with body mass index (BMI) of 36.0 to 36.9 in adult Chronic Discussed healthy diet and regular exercise options  Encouraged to exercise at least 150 minutes per week with 2 days of strength training - Referral to Nutrition and Diabetes Services  5. Body  mass index (BMI) of 45.0-49.9 in adult (HCC)   Arnette FeltsJanece Ramaj Frangos, FNP    THE PATIENT IS ENCOURAGED TO PRACTICE SOCIAL DISTANCING DUE TO THE COVID-19 PANDEMIC.

## 2018-07-26 NOTE — Patient Instructions (Addendum)
Health Maintenance  Topic Date Due  . URINE MICROALBUMIN  12/09/2003  . INFLUENZA VACCINE  08/12/2018  . PAP-Cervical Cytology Screening  01/26/2021  . PAP SMEAR-Modifier  01/26/2021  . TETANUS/TDAP  05/21/2027  . HIV Screening  Completed   Health Maintenance, Female Adopting a healthy lifestyle and getting preventive care are important in promoting health and wellness. Ask your health care provider about:  The right schedule for you to have regular tests and exams.  Things you can do on your own to prevent diseases and keep yourself healthy. What should I know about diet, weight, and exercise? Eat a healthy diet   Eat a diet that includes plenty of vegetables, fruits, low-fat dairy products, and lean protein.  Do not eat a lot of foods that are high in solid fats, added sugars, or sodium. Maintain a healthy weight Body mass index (BMI) is used to identify weight problems. It estimates body fat based on height and weight. Your health care provider can help determine your BMI and help you achieve or maintain a healthy weight. Get regular exercise Get regular exercise. This is one of the most important things you can do for your health. Most adults should:  Exercise for at least 150 minutes each week. The exercise should increase your heart rate and make you sweat (moderate-intensity exercise).  Do strengthening exercises at least twice a week. This is in addition to the moderate-intensity exercise.  Spend less time sitting. Even light physical activity can be beneficial. Watch cholesterol and blood lipids Have your blood tested for lipids and cholesterol at 25 years of age, then have this test every 5 years. Have your cholesterol levels checked more often if:  Your lipid or cholesterol levels are high.  You are older than 25 years of age.  You are at high risk for heart disease. What should I know about cancer screening? Depending on your health history and family history,  you may need to have cancer screening at various ages. This may include screening for:  Breast cancer.  Cervical cancer.  Colorectal cancer.  Skin cancer.  Lung cancer. What should I know about heart disease, diabetes, and high blood pressure? Blood pressure and heart disease  High blood pressure causes heart disease and increases the risk of stroke. This is more likely to develop in people who have high blood pressure readings, are of African descent, or are overweight.  Have your blood pressure checked: ? Every 3-5 years if you are 4218-25 years of age. ? Every year if you are 25 years old or older. Diabetes Have regular diabetes screenings. This checks your fasting blood sugar level. Have the screening done:  Once every three years after age 25 if you are at a normal weight and have a low risk for diabetes.  More often and at a younger age if you are overweight or have a high risk for diabetes. What should I know about preventing infection? Hepatitis B If you have a higher risk for hepatitis B, you should be screened for this virus. Talk with your health care provider to find out if you are at risk for hepatitis B infection. Hepatitis C Testing is recommended for:  Everyone born from [birth year] through 1967.  Anyone with known risk factors for hepatitis C. Sexually transmitted infections (STIs)  Get screened for STIs, including gonorrhea and chlamydia, if: ? You are sexually active and are younger than 25 years of age. ? You are older than 25 years of age  and your health care provider tells you that you are at risk for this type of infection. ? Your sexual activity has changed since you were last screened, and you are at increased risk for chlamydia or gonorrhea. Ask your health care provider if you are at risk.  Ask your health care provider about whether you are at high risk for HIV. Your health care provider may recommend a prescription medicine to help prevent HIV infection.  If you choose to take medicine to prevent HIV, you should first get tested for HIV. You should then be tested every 3 months for as long as you are taking the medicine. Pregnancy  If you are about to stop having your period (premenopausal) and you may become pregnant, seek counseling before you get pregnant.  Take 400 to 800 micrograms (mcg) of folic acid every day if you become pregnant.  Ask for birth control (contraception) if you want to prevent pregnancy. Osteoporosis and menopause Osteoporosis is a disease in which the bones lose minerals and strength with aging. This can result in bone fractures. If you are 32 years old or older, or if you are at risk for osteoporosis and fractures, ask your health care provider if you should:  Be screened for bone loss.  Take a calcium or vitamin D supplement to lower your risk of fractures.  Be given hormone replacement therapy (HRT) to treat symptoms of menopause. Follow these instructions at home: Lifestyle  Do not use any products that contain nicotine or tobacco, such as cigarettes, e-cigarettes, and chewing tobacco. If you need help quitting, ask your health care provider.  Do not use street drugs.  Do not share needles.  Ask your health care provider for help if you need support or information about quitting drugs. Alcohol use  Do not drink alcohol if: ? Your health care provider tells you not to drink. ? You are pregnant, may be pregnant, or are planning to become pregnant.  If you drink alcohol: ? Limit how much you use to 0-1 drink a day. ? Limit intake if you are breastfeeding.  Be aware of how much alcohol is in your drink. In the [place] one drink equals one 12 oz bottle of beer (355 mL), one 5 oz glass of wine (148 mL), or one 1 oz glass of hard liquor (44 mL). General instructions  Schedule regular health, dental, and eye exams.  Stay current with your vaccines.  Tell your health care provider if: ? You often feel  depressed. ? You have ever been abused or do not feel safe at home. Summary  Adopting a healthy lifestyle and getting preventive care are important in promoting health and wellness.  Follow your health care provider's instructions about healthy diet, exercising, and getting tested or screened for diseases.  Follow your health care provider's instructions on monitoring your cholesterol and blood pressure. This information is not intended to replace advice given to you by your health care provider. Make sure you discuss any questions you have with your health care provider. Document Released: 07/13/2010 Document Revised: 12/21/2017 Document Reviewed: 12/21/2017 Elsevier Patient Education  2020 [place]  Walking is a great form of exercise to increase your strength, endurance and overall fitness.  A walking program can help you start slowly and gradually build endurance as you go.  Everyone's ability is different, so each person's starting point will be different.  You do not have to follow them exactly.  The are just samples. You  should simply find out what's right for you and stick to that program.   In the beginning, you'll start off walking 2-3 times a day for short distances.  As you get stronger, you'll be walking further at just 1-2 times per day.  A. You Can Walk For A Certain Length Of Time Each Day    Walk 5 minutes 3 times per day.  Increase 2 minutes every 2 days (3 times per day).  Work up to 25-30 minutes (1-2 times per day).   Example:   Day 1-2 5 minutes 3 times per day   Day 7-8 12 minutes 2-3 times per day   Day 13-14 25 minutes 1-2 times per day  B. You Can Walk For a Certain Distance Each Day     Distance can be substituted for time.    Example:   3 trips to mailbox (at road)   3 trips to corner of block   3 trips around the block  C. Go to local high school and use the track.   Please only do the exercises that your therapist has initialed and  dated Obesity, Adult Obesity is having too much body fat. Being obese means that your weight is more than what is healthy for you. BMI is a number that explains how much body fat you have. If you have a BMI of 30 or more, you are obese. Obesity is often caused by eating or drinking more calories than your body uses. Changing your lifestyle can help you lose weight. Obesity can cause serious health problems, such as:  Stroke.  Coronary artery disease (CAD).  Type 2 diabetes.  Some types of cancer, including cancers of the colon, breast, uterus, and gallbladder.  Osteoarthritis.  High blood pressure (hypertension).  High cholesterol.  Sleep apnea.  Gallbladder stones.  Infertility problems. What are the causes?  Eating meals each day that are high in calories, sugar, and fat.  Being born with genes that may make you more likely to become obese.  Having a medical condition that causes obesity.  Taking certain medicines.  Sitting a lot (having a sedentary lifestyle).  Not getting enough sleep.  Drinking a lot of drinks that have sugar in them. What increases the risk?  Having a family history of obesity.  Being an [place] woman.  Being a Hispanic man.  Living in an area with limited access to: ? [place], recreation centers, or sidewalks. ? Healthy food choices, such as grocery stores and farmers' markets. What are the signs or symptoms? The main sign is having too much body fat. How is this treated?  Treatment for this condition often includes changing your lifestyle. Treatment may include: ? Changing your diet. This may include making a healthy meal plan. ? Exercise. This may include activity that causes your heart to beat faster (aerobic exercise) and strength training. Work with your doctor to design a program that works for you. ? Medicine to help you lose weight. This may be used if you are not able to lose 1 pound a week after 6 weeks of healthy  eating and more exercise. ? Treating conditions that cause the obesity. ? Surgery. Options may include gastric banding and gastric bypass. This may be done if:  Other treatments have not helped to improve your condition.  You have a BMI of 40 or higher.  You have life-threatening health problems related to obesity. Follow these instructions at home: Eating and drinking   Follow advice from  your doctor about what to eat and drink. Your doctor may tell you to: ? Limit fast food, sweets, and processed snack foods. ? Choose low-fat options. For example, choose low-fat milk instead of whole milk. ? Eat 5 or more servings of fruits or vegetables each day. ? Eat at home more often. This gives you more control over what you eat. ? Choose healthy foods when you eat out. ? Learn to read food labels. This will help you learn how much food is in 1 serving. ? Keep low-fat snacks available. ? Avoid drinks that have a lot of sugar in them. These include soda, fruit juice, iced tea with sugar, and flavored milk.  Drink enough water to keep your pee (urine) pale yellow.  Do not go on fad diets. Physical activity  Exercise often, as told by your doctor. Most adults should get up to 150 minutes of moderate-intensity exercise every week.Ask your doctor: ? What types of exercise are safe for you. ? How often you should exercise.  Warm up and stretch before being active.  Do slow stretching after being active (cool down).  Rest between times of being active. Lifestyle  Work with your doctor and a food expert (dietitian) to set a weight-loss goal that is best for you.  Limit your screen time.  Find ways to reward yourself that do not involve food.  Do not drink alcohol if: ? Your doctor tells you not to drink. ? You are pregnant, may be pregnant, or are planning to become pregnant.  If you drink alcohol: ? Limit how much you use to:  0-1 drink a day for women.  0-2 drinks a day for  men. ? Be aware of how much alcohol is in your drink. In the [place] one drink equals one 12 oz bottle of beer (355 mL), one 5 oz glass of wine (148 mL), or one 1 oz glass of hard liquor (44 mL). General instructions  Keep a weight-loss journal. This can help you keep track of: ? The food that you eat. ? How much exercise you get.  Take over-the-counter and prescription medicines only as told by your doctor.  Take vitamins and supplements only as told by your doctor.  Think about joining a support group.  Keep all follow-up visits as told by your doctor. This is important. Contact a doctor if:  You cannot meet your weight loss goal after you have changed your diet and lifestyle for 6 weeks. Get help right away if you:  Are having trouble breathing.  Are having thoughts of harming yourself. Summary  Obesity is having too much body fat.  Being obese means that your weight is more than what is healthy for you.  Work with your doctor to set a weight-loss goal.  Get regular exercise as told by your doctor. This information is not intended to replace advice given to you by your health care provider. Make sure you discuss any questions you have with your health care provider. Document Released: 03/22/2011 Document Revised: 09/01/2017 Document Reviewed: 09/01/2017 Elsevier Patient Education  2020 [employer]

## 2018-07-27 LAB — HEMOGLOBIN A1C
Est. average glucose Bld gHb Est-mCnc: 146 mg/dL
Hgb A1c MFr Bld: 6.7 % — ABNORMAL HIGH (ref 4.8–5.6)

## 2018-07-27 LAB — CBC
Hematocrit: 37.2 % (ref 34.0–46.6)
Hemoglobin: 12.5 g/dL (ref 11.1–15.9)
MCH: 27.8 pg (ref 26.6–33.0)
MCHC: 33.6 g/dL (ref 31.5–35.7)
MCV: 83 fL (ref 79–97)
Platelets: 423 10*3/uL (ref 150–450)
RBC: 4.5 x10E6/uL (ref 3.77–5.28)
RDW: 13.3 % (ref 11.7–15.4)
WBC: 7.9 10*3/uL (ref 3.4–10.8)

## 2018-07-27 LAB — BMP8+ANION GAP
Anion Gap: 17 mmol/L (ref 10.0–18.0)
BUN/Creatinine Ratio: 8 — ABNORMAL LOW (ref 9–23)
BUN: 7 mg/dL (ref 6–20)
CO2: 20 mmol/L (ref 20–29)
Calcium: 9.2 mg/dL (ref 8.7–10.2)
Chloride: 104 mmol/L (ref 96–106)
Creatinine, Ser: 0.83 mg/dL (ref 0.57–1.00)
GFR calc Af Amer: 114 mL/min/{1.73_m2} (ref >59)
GFR calc non Af Amer: 99 mL/min/{1.73_m2} (ref >59)
Glucose: 191 mg/dL — ABNORMAL HIGH (ref 65–99)
Potassium: 4.3 mmol/L (ref 3.5–5.2)
Sodium: 141 mmol/L (ref 134–144)

## 2018-07-27 LAB — LIPID PANEL
Chol/HDL Ratio: 4.5 ratio — ABNORMAL HIGH (ref 0.0–4.4)
Cholesterol, Total: 163 mg/dL (ref 100–199)
HDL: 36 mg/dL — ABNORMAL LOW (ref >39)
LDL Calculated: 94 mg/dL (ref 0–99)
Triglycerides: 167 mg/dL — ABNORMAL HIGH (ref 0–149)
VLDL Cholesterol Cal: 33 mg/dL (ref 5–40)

## 2018-07-27 LAB — VITAMIN D 25 HYDROXY (VIT D DEFICIENCY, FRACTURES): Vit D, 25-Hydroxy: 33.4 ng/mL (ref 30.0–100.0)

## 2018-07-27 MED ORDER — METFORMIN HCL 500 MG PO TABS: 500.0000 mg | ORAL_TABLET | Freq: Two times a day (BID) | ORAL | 2 refills | Status: DC

## 2018-08-01 DIAGNOSIS — Z634 Disappearance and death of family member: Secondary | ICD-10-CM | POA: Diagnosis not present

## 2018-08-01 DIAGNOSIS — F332 Major depressive disorder, recurrent severe without psychotic features: Secondary | ICD-10-CM | POA: Diagnosis not present

## 2018-08-01 DIAGNOSIS — F4311 Post-traumatic stress disorder, acute: Secondary | ICD-10-CM | POA: Diagnosis not present

## 2018-08-09 DIAGNOSIS — Z634 Disappearance and death of family member: Secondary | ICD-10-CM | POA: Diagnosis not present

## 2018-08-09 DIAGNOSIS — F4311 Post-traumatic stress disorder, acute: Secondary | ICD-10-CM | POA: Diagnosis not present

## 2018-08-09 DIAGNOSIS — F332 Major depressive disorder, recurrent severe without psychotic features: Secondary | ICD-10-CM | POA: Diagnosis not present

## 2018-08-16 DIAGNOSIS — F4311 Post-traumatic stress disorder, acute: Secondary | ICD-10-CM | POA: Diagnosis not present

## 2018-08-16 DIAGNOSIS — F332 Major depressive disorder, recurrent severe without psychotic features: Secondary | ICD-10-CM | POA: Diagnosis not present

## 2018-08-16 DIAGNOSIS — Z634 Disappearance and death of family member: Secondary | ICD-10-CM | POA: Diagnosis not present

## 2018-08-23 DIAGNOSIS — Z634 Disappearance and death of family member: Secondary | ICD-10-CM | POA: Diagnosis not present

## 2018-08-23 DIAGNOSIS — F4311 Post-traumatic stress disorder, acute: Secondary | ICD-10-CM | POA: Diagnosis not present

## 2018-08-23 DIAGNOSIS — F332 Major depressive disorder, recurrent severe without psychotic features: Secondary | ICD-10-CM | POA: Diagnosis not present

## 2018-08-29 ENCOUNTER — Other Ambulatory Visit: Payer: Self-pay

## 2018-08-29 ENCOUNTER — Ambulatory Visit (INDEPENDENT_AMBULATORY_CARE_PROVIDER_SITE_OTHER): Payer: BC Managed Care – PPO | Admitting: Nurse Practitioner

## 2018-08-29 ENCOUNTER — Encounter: Payer: Self-pay | Admitting: Nurse Practitioner

## 2018-08-29 VITALS — BP 122/88 | HR 86 | Temp 97.1°F | Ht 67.4 in | Wt 298.6 lb

## 2018-08-29 DIAGNOSIS — I10 Essential (primary) hypertension: Secondary | ICD-10-CM | POA: Diagnosis not present

## 2018-08-29 DIAGNOSIS — Z6841 Body Mass Index (BMI) 40.0 and over, adult: Secondary | ICD-10-CM | POA: Diagnosis not present

## 2018-08-29 DIAGNOSIS — R109 Unspecified abdominal pain: Secondary | ICD-10-CM | POA: Diagnosis not present

## 2018-08-29 LAB — POCT URINALYSIS DIPSTICK
Bilirubin, UA: NEGATIVE
Blood, UA: NEGATIVE
Glucose, UA: NEGATIVE
Ketones, UA: NEGATIVE
Nitrite, UA: NEGATIVE
Protein, UA: POSITIVE — AB
Spec Grav, UA: 1.02 (ref 1.010–1.025)
Urobilinogen, UA: 1 E.U./dL
pH, UA: 7 (ref 5.0–8.0)

## 2018-08-29 MED ORDER — AMLODIPINE BESYLATE 5 MG PO TABS: 5.0000 mg | ORAL_TABLET | Freq: Every day | ORAL | 1 refills | Status: DC

## 2018-08-29 MED ORDER — SAXENDA 18 MG/3ML ~~LOC~~ SOPN: 3.0000 mg | PEN_INJECTOR | Freq: Every day | SUBCUTANEOUS | 1 refills | Status: DC

## 2018-08-29 NOTE — Patient Instructions (Signed)

## 2018-08-29 NOTE — Progress Notes (Addendum)
Subjective:     Patient ID: Stacy Wang , female    DOB: [birth year] , 23 y.o.   MRN: [id number]   Chief Complaint  Patient presents with  . Weight Check  . Hypertension    HPI  Wt Readings from Last 3 Encounters: 08/29/18 : 298 lb 9.6 oz (135.4 kg) 07/26/18 : 294 lb 9.6 oz (133.6 kg) 03/25/18 : 284 lb (128.8 kg)  She is exercising 3 days a week by walking 1-2.5 miles each time.    Hypertension This is a chronic problem. The current episode started more than 1 year ago. The problem is controlled. Pertinent negatives include no anxiety, blurred vision or headaches. Risk factors for coronary artery disease include obesity. Past treatments include calcium channel blockers and beta blockers. There are no compliance problems.  There is no history of angina. There is no history of chronic renal disease.     Past Medical History:  Diagnosis Date  . Bell palsy   . Bell's palsy   . Hypertension   . Migraine      Family History  Problem Relation Age of Onset  . Depression Mother   . Diabetes Father   . Heart disease Father      Current Outpatient Medications:  .  albuterol (PROVENTIL HFA;VENTOLIN HFA) 108 (90 Base) MCG/ACT inhaler, Inhale 1-2 puffs into the lungs every 6 (six) hours as needed for wheezing or shortness of breath., Disp: 1 Inhaler, Rfl: 0 .  fluticasone (FLONASE) 50 MCG/ACT nasal spray, Place 2 sprays into both nostrils daily., Disp: 16 g, Rfl: 0 .  Levonorgestrel-Ethinyl Estrad (VIENVA PO), Take by mouth., Disp: , Rfl:  .  metFORMIN (GLUCOPHAGE) 500 MG tablet, Take 1 tablet (500 mg total) by mouth 2 (two) times daily with a meal., Disp: 60 tablet, Rfl: 2 .  propranolol (INDERAL) 60 MG tablet, Take 60 mg by mouth 3 (three) times daily., Disp: , Rfl:  .  amLODipine (NORVASC) 5 MG tablet, TAKE 1 TABLET BY MOUTH ONCE DAILY (Patient not taking: Reported on 07/26/2018), Disp: 90 tablet, Rfl: 0   Allergies  Allergen Reactions  . Pollen Extract Itching     Review  of Systems  Constitutional: Negative.   Eyes: Negative for blurred vision.  Respiratory: Negative.   Cardiovascular: Negative.   Neurological: Negative for dizziness and headaches.  Psychiatric/Behavioral: Negative.  Negative for agitation and confusion.     Today's Vitals   08/29/18 1047  BP: 122/88  Pulse: 86  Temp: (!) 97.1 F (36.2 C)  TempSrc: Oral  Weight: 298 lb 9.6 oz (135.4 kg)  Height: 5' 7.4" (1.712 m)  PainSc: 0-No pain   Body mass index is 46.21 kg/m.   Objective:  Physical Exam Constitutional:      Appearance: Normal appearance.  Cardiovascular:     Rate and Rhythm: Normal rate and regular rhythm.     Pulses: Normal pulses.     Heart sounds: Normal heart sounds. No murmur.  Pulmonary:     Effort: Pulmonary effort is normal. No respiratory distress.     Breath sounds: Normal breath sounds.  Musculoskeletal: Normal range of motion.        General: No tenderness.  Skin:    General: Skin is warm and dry.     Capillary Refill: Capillary refill takes less than 2 seconds.  Neurological:     General: No focal deficit present.     Mental Status: She is alert and oriented to person, place, and time.  Psychiatric:  Mood and Affect: Mood normal.        Behavior: Behavior normal.        Thought Content: Thought content normal.        Judgment: Judgment normal.         Assessment And Plan:      1. Essential hypertension, benign  Restart amlodipine she has been off since March   She has an increase in her weight.   - amLODipine (NORVASC) 5 MG tablet; Take 1 tablet (5 mg total) by mouth daily.  Dispense: 90 tablet; Refill: 1  2. Flank pain  Non tender on palpation to left flank area  Urinalysis has small leukocytes present  Will send for urine culture.   3. Class 3 severe obesity due to excess calories with serious comorbidity and body mass index (BMI) of 40.0 to 44.9 in adult [facility])  She is struggling with losing weight, she has had a 14 lb  weight gain since March.   She is walking 3 days a week and changing her diet to limit sugary foods and high carbs.  She is scheduled to see the nutritionist next week  I have stressed with her to focus on decreasing her stress level and will try Saxenda. Discussed side effects to include nausea or GI upset.  I have advised if she has difficulty with swallowing or abdominal pain to notify the office.    She is to return in 2 months for follow up weight management - Liraglutide -Weight Management (SAXENDA) 18 MG/3ML SOPN; Inject 3 mg into the skin daily.  Dispense: 5 pen; Refill: 1  Minette Brine, FNP    THE PATIENT IS ENCOURAGED TO PRACTICE SOCIAL DISTANCING DUE TO THE COVID-19 PANDEMIC.

## 2018-08-30 ENCOUNTER — Telehealth: Payer: Self-pay

## 2018-08-30 DIAGNOSIS — F332 Major depressive disorder, recurrent severe without psychotic features: Secondary | ICD-10-CM | POA: Diagnosis not present

## 2018-08-30 DIAGNOSIS — Z634 Disappearance and death of family member: Secondary | ICD-10-CM | POA: Diagnosis not present

## 2018-08-30 DIAGNOSIS — F4311 Post-traumatic stress disorder, acute: Secondary | ICD-10-CM | POA: Diagnosis not present

## 2018-08-30 LAB — URINE CULTURE

## 2018-08-30 NOTE — Telephone Encounter (Signed)
PT AND PHARM NOTIFIED OF APPROVAL OF SAXENDA. COST TO PT IS $24.99 

## 2018-09-06 ENCOUNTER — Encounter: Payer: BC Managed Care – PPO | Attending: Nurse Practitioner | Admitting: Dietician

## 2018-09-06 ENCOUNTER — Other Ambulatory Visit: Payer: Self-pay

## 2018-09-06 ENCOUNTER — Encounter: Payer: Self-pay | Admitting: Dietician

## 2018-09-06 DIAGNOSIS — R7309 Other abnormal glucose: Secondary | ICD-10-CM | POA: Insufficient documentation

## 2018-09-06 DIAGNOSIS — F4311 Post-traumatic stress disorder, acute: Secondary | ICD-10-CM | POA: Diagnosis not present

## 2018-09-06 DIAGNOSIS — F332 Major depressive disorder, recurrent severe without psychotic features: Secondary | ICD-10-CM | POA: Diagnosis not present

## 2018-09-06 DIAGNOSIS — Z634 Disappearance and death of family member: Secondary | ICD-10-CM | POA: Diagnosis not present

## 2018-09-06 NOTE — Progress Notes (Signed)
Medical Nutrition Therapy  Appt Start Time: 2:20pm  End Time: 3:20pm   Primary concerns today: blood sugar management   Referral diagnosis: I10- essential hypertension, benign;  R73.09- other abnormal glucose;  U20.25,K27.06- class 2 obesity d/t excess calories without serious comorbidity w/ BMI of 36 to 36.9 in adult  Preferred learning style: no preference indicated Learning readiness: ready   NUTRITION ASSESSMENT   Biochemical Data & Labs A1c: 6.7% (07/26/2018)  Clinical Medical Hx: obesity, T2DM, sleep apnea, HTN Medications: Almodipine, birth control, propanolol, metformin, OTC sleep aid  Psychosocial/Lifestyle Pt has attended culinary school. 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 Personable.   Food & Nutrition Related Hx Dietary Hx: Typical meal pattern is 1 (maybe 2) meals per day. Hx of various diet attempts, including eating only 1 large meal per day and only drinking green tea and water for other meals. Likes dairy foods, although suspects lactose intolerance. States she grew up vegetarian, and has gone back and forth since she has gotten older. Current intake includes mostly vegetables and starches. Likes dried fruit, eggs, and yogurt. Hesitant about taking medications that help with weight loss, would like to try dietary approaches first. States she would like to establish a better eating pattern and figure out ways to meal prep.  Supplements: prenatal (for hair/skin/nails) Sleep: has sleep apnea, struggles to get quality sleep  Physical Activity  Current average weekly physical activity: workouts 2-3x/week, walking daily   Estimated Energy Needs Calories: 2000-2200 Carbohydrate: 225-248g Protein: 125-138g Fat: 67-73g   NUTRITION DIAGNOSIS  Excessive carbohydrate intake (NI-5.8.2) related to food/nutrition related knowledge deficit as evidenced by HgbA1c level of 6.7%, reported dietary intake of types of carbohydrates inconsistent with estimated  needs, and reported binge eating type meal pattern.    NUTRITION INTERVENTION  Nutrition education (E-1) on the following topics:  . Glucose control through physical activity, weight management, and diet . Healthful balanced eating: carbohydrate counting, spreading carbohydrate intake throughout the day, balance meals/snacks with protein, 3+ meals/snacks per day  Handouts Provided Include   Types of Diabetes & Blood Glucose Control   Carbohydrate Counting   Meal Ideas   Balanced Snacks   Learning Style & Readiness for Change Teaching method utilized: Visual & Auditory  Demonstrated degree of understanding via: Teach Back  Barriers to learning/adherence to lifestyle change: relationship with food   MONITORING & EVALUATION Dietary intake, weekly physical activity, and goals in 3 months.  RD's Notes for Next Visit  . Fiber and fluid intake . Mindfulness/ intuitive eating  Next Steps  Patient is to return to [facility] for follow up visit in 2 months.

## 2018-09-13 DIAGNOSIS — F332 Major depressive disorder, recurrent severe without psychotic features: Secondary | ICD-10-CM | POA: Diagnosis not present

## 2018-09-13 DIAGNOSIS — F4311 Post-traumatic stress disorder, acute: Secondary | ICD-10-CM | POA: Diagnosis not present

## 2018-09-13 DIAGNOSIS — Z634 Disappearance and death of family member: Secondary | ICD-10-CM | POA: Diagnosis not present

## 2018-09-13 NOTE — Progress Notes (Signed)
Have her to come in on Wednesday for repeat urinalysis

## 2018-09-20 ENCOUNTER — Other Ambulatory Visit: Payer: Self-pay

## 2018-09-20 ENCOUNTER — Ambulatory Visit (INDEPENDENT_AMBULATORY_CARE_PROVIDER_SITE_OTHER): Payer: BC Managed Care – PPO

## 2018-09-20 ENCOUNTER — Telehealth: Payer: Self-pay

## 2018-09-20 ENCOUNTER — Other Ambulatory Visit: Payer: Self-pay | Admitting: Nurse Practitioner

## 2018-09-20 VITALS — BP 128/84 | HR 82 | Temp 97.8°F | Ht 67.4 in | Wt 297.8 lb

## 2018-09-20 DIAGNOSIS — R82998 Other abnormal findings in urine: Secondary | ICD-10-CM

## 2018-09-20 LAB — POCT URINALYSIS DIPSTICK
Glucose, UA: NEGATIVE
Nitrite, UA: NEGATIVE
Protein, UA: POSITIVE — AB
Spec Grav, UA: 1.02 (ref 1.010–1.025)
Urobilinogen, UA: 0.2 E.U./dL
pH, UA: 7 (ref 5.0–8.0)

## 2018-09-20 MED ORDER — SULFAMETHOXAZOLE-TRIMETHOPRIM 800-160 MG PO TABS: 1.0000 | ORAL_TABLET | Freq: Two times a day (BID) | ORAL | 0 refills | Status: DC

## 2018-09-20 NOTE — Progress Notes (Signed)
Pt here today for repeat urinalysis

## 2018-09-20 NOTE — Progress Notes (Signed)
Call patient and let her know I have sent a prescription for an antibiotic to take twice a day for 5 days.

## 2018-09-20 NOTE — Telephone Encounter (Signed)
Minette Brine, FNP  [address] [address], [place]        Call patient and let her know I have sent a prescription for an antibiotic to take twice a day for 5 days.    Spoke with patient, she stated she will stop by the pharmacy today to pick up prescription

## 2018-10-04 DIAGNOSIS — F332 Major depressive disorder, recurrent severe without psychotic features: Secondary | ICD-10-CM | POA: Diagnosis not present

## 2018-10-11 DIAGNOSIS — F4311 Post-traumatic stress disorder, acute: Secondary | ICD-10-CM | POA: Diagnosis not present

## 2018-10-11 DIAGNOSIS — F332 Major depressive disorder, recurrent severe without psychotic features: Secondary | ICD-10-CM | POA: Diagnosis not present

## 2018-10-11 DIAGNOSIS — Z634 Disappearance and death of family member: Secondary | ICD-10-CM | POA: Diagnosis not present

## 2018-10-18 DIAGNOSIS — F332 Major depressive disorder, recurrent severe without psychotic features: Secondary | ICD-10-CM | POA: Diagnosis not present

## 2018-10-18 DIAGNOSIS — Z634 Disappearance and death of family member: Secondary | ICD-10-CM | POA: Diagnosis not present

## 2018-10-18 DIAGNOSIS — F4311 Post-traumatic stress disorder, acute: Secondary | ICD-10-CM | POA: Diagnosis not present

## 2018-10-25 DIAGNOSIS — F332 Major depressive disorder, recurrent severe without psychotic features: Secondary | ICD-10-CM | POA: Diagnosis not present

## 2018-10-25 DIAGNOSIS — Z634 Disappearance and death of family member: Secondary | ICD-10-CM | POA: Diagnosis not present

## 2018-10-25 DIAGNOSIS — F4311 Post-traumatic stress disorder, acute: Secondary | ICD-10-CM | POA: Diagnosis not present

## 2018-10-26 ENCOUNTER — Ambulatory Visit: Payer: BC Managed Care – PPO | Admitting: Nurse Practitioner

## 2018-11-01 ENCOUNTER — Encounter: Payer: BC Managed Care – PPO | Attending: Nurse Practitioner | Admitting: Dietician

## 2018-11-01 ENCOUNTER — Other Ambulatory Visit: Payer: Self-pay

## 2018-11-01 ENCOUNTER — Encounter: Payer: Self-pay | Admitting: Dietician

## 2018-11-01 DIAGNOSIS — R7309 Other abnormal glucose: Secondary | ICD-10-CM | POA: Insufficient documentation

## 2018-11-01 DIAGNOSIS — F332 Major depressive disorder, recurrent severe without psychotic features: Secondary | ICD-10-CM | POA: Diagnosis not present

## 2018-11-01 DIAGNOSIS — F4311 Post-traumatic stress disorder, acute: Secondary | ICD-10-CM | POA: Diagnosis not present

## 2018-11-01 DIAGNOSIS — Z634 Disappearance and death of family member: Secondary | ICD-10-CM | POA: Diagnosis not present

## 2018-11-01 NOTE — Progress Notes (Signed)
Medical Nutrition Therapy  Follow-Up Visit Appt Start Time: 9:00am   End Time: 9:45am  Patient was originally seen on 09/06/2018 for diabetes and weight management.  Primary concerns today: meal planning/grocery shopping   NUTRITION ASSESSMENT   Clinical Medical Hx: obesity, T2DM, sleep apnea, HTN Medications: Almodipine, birth control, propanolol, metformin, OTC sleep aid Labs: 6.7% (07/26/2018) - states she is going for a doctor visit next week   Lifestyle & Dietary Hx Pt has attended culinary school. 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 Personable.   Physical activity includes lots of walking, enjoys walking in the park. States she is trying to be more physically active to account for all the calories she eats. Believes she eats too many calories and is not being active enough. Takes a prenatal supplement. Has sleep apnea and struggles to get good quality sleep.   Typical meal pattern is 3 meals per day. This is improved from previously only eating 1 meal per day. States she has continued to eliminate meat from her diet, preferring a pescatarian-type diet. States she went through a period of binge-eating behaviors about a month ago but has since "gotten herself together." States for the past month she has been more intentional about eating/choosing healthful foods, but is also trying to go ahead and eat up the foods she already has at home before buying more, mostly consisting of carbohydrate foods such as oatmeal and pasta. States she has a sweet tooth and enjoys candy, however she puts a "limit" on how many pieces of candy she eats per day and keeps the candy bowl and other sweets out of her bedroom.   States she sometimes wakes up at night (around 4am) and is hungry, asked about good snack options for when this happens as well as what she can eat for dinner to prevent this from happening at night. Foods eaten mostly include flavored instant oatmeal, yogurt, pasta  dishes, sushi rice, spinach cakes, and seafood. Beverages include water and whole milk. Pt asked about good bread and oatmeal options.   Estimated Energy Needs Calories: 2000-2200 Carbohydrate: 225-248g Protein: 125-138g Fat: 67-73g   NUTRITION DIAGNOSIS  Diagnosis on 09/06/2018: Excessive carbohydrate intake (NI-5.8.2) related to food/nutrition related knowledge deficit as evidenced by HgbA1c level of 6.7%, reported dietary intake of types of carbohydrates inconsistent with estimated needs, and reported binge eating type meal pattern. Status: Continue   NUTRITION INTERVENTION  Nutrition education (E-1) on the following topics:   Balanced, filling meals to include protein and fiber   Late-night snack ideas   Reviewed ways to stabilize blood sugar and the purpose of limiting added sugars as it relates to this   Handouts Provided Include   Plant Based  Breakfast Ideas  Learning Style & Readiness for Change Teaching method utilized: Visual & Auditory  Demonstrated degree of understanding via: Teach Back  Barriers to learning/adherence to lifestyle change: relationship with food (this is getting better)   Progress/ Updates on Pt's Goals . Eating 3 meals/day! (More than 1/day)  . Working on eating more mindfully  . NEW: Keep grams of sugar in the single digits per serving  . NEW: Incorporate more protein at meal times    MONITORING & EVALUATION Dietary intake, weekly physical activity, and goals in 3 months.  RD's Notes for Next Visit . Fluid intake  . A1c   Next Steps  Patient is to return to [facility] for follow up appointment in 3 months.

## 2018-11-01 NOTE — Patient Instructions (Addendum)
Remember your goals from today:    Check all nutrition labels and look at the grams of sugar in a serving. Keep the grams of sugar in the single digits per serving (9 grams or less.)   Try to mindfully incorporate some protein every time you eat. Non-meat sources include eggs, fish and seafood, dairy (especially greek yogurt), beans, lentils, soy, nuts, seeds, and nutritional yeast.   If you need a snack at night, try [place] yogurt, a protein shake, tart cherries, cashews, a couple tablespoons of peanut butter with whole grain crackers, or a handful of berries.

## 2018-11-02 ENCOUNTER — Encounter: Payer: Self-pay | Admitting: Nurse Practitioner

## 2018-11-02 ENCOUNTER — Ambulatory Visit (INDEPENDENT_AMBULATORY_CARE_PROVIDER_SITE_OTHER): Payer: BC Managed Care – PPO | Admitting: Nurse Practitioner

## 2018-11-02 VITALS — BP 116/68 | HR 85 | Temp 98.2°F | Ht 67.0 in | Wt 299.2 lb

## 2018-11-02 DIAGNOSIS — Z6836 Body mass index (BMI) 36.0-36.9, adult: Secondary | ICD-10-CM

## 2018-11-02 DIAGNOSIS — G43809 Other migraine, not intractable, without status migrainosus: Secondary | ICD-10-CM

## 2018-11-02 DIAGNOSIS — I10 Essential (primary) hypertension: Secondary | ICD-10-CM

## 2018-11-02 DIAGNOSIS — R0683 Snoring: Secondary | ICD-10-CM

## 2018-11-02 DIAGNOSIS — R7309 Other abnormal glucose: Secondary | ICD-10-CM

## 2018-11-02 DIAGNOSIS — Z23 Encounter for immunization: Secondary | ICD-10-CM

## 2018-11-02 DIAGNOSIS — F1911 Other psychoactive substance abuse, in remission: Secondary | ICD-10-CM

## 2018-11-02 DIAGNOSIS — E6609 Other obesity due to excess calories: Secondary | ICD-10-CM | POA: Diagnosis not present

## 2018-11-02 MED ORDER — TROKENDI XR 25 MG PO CP24: 1.0000 | ORAL_CAPSULE | Freq: Every day | ORAL | 3 refills | Status: DC

## 2018-11-02 NOTE — Patient Instructions (Signed)
Influenza Virus Vaccine (Flucelvax) What is this medicine? INFLUENZA VIRUS VACCINE (in floo EN zuh VAHY ruhs vak SEEN) helps to reduce the risk of getting influenza also known as the flu. The vaccine only helps protect you against some strains of the flu. This medicine may be used for other purposes; ask your health care provider or pharmacist if you have questions. COMMON BRAND NAME(S): FLUCELVAX What should I tell my health care provider before I take this medicine? They need to know if you have any of these conditions:  bleeding disorder like hemophilia  fever or infection  Guillain-Barre syndrome or other neurological problems  immune system problems  infection with the human immunodeficiency virus (HIV) or AIDS  low blood platelet counts  multiple sclerosis  an unusual or allergic reaction to influenza virus vaccine, other medicines, foods, dyes or preservatives  pregnant or trying to get pregnant  breast-feeding How should I use this medicine? This vaccine is for injection into a muscle. It is given by a health care professional. A copy of Vaccine Information Statements will be given before each vaccination. Read this sheet carefully each time. The sheet may change frequently. Talk to your pediatrician regarding the use of this medicine in children. Special care may be needed. Overdosage: If you think you've taken too much of this medicine contact a poison control center or emergency room at once. Overdosage: If you think you have taken too much of this medicine contact a poison control center or emergency room at once. NOTE: This medicine is only for you. Do not share this medicine with others. What if I miss a dose? This does not apply. What may interact with this medicine?  chemotherapy or radiation therapy  medicines that lower your immune system like etanercept, anakinra, infliximab, and adalimumab  medicines that treat or prevent blood clots like  warfarin  phenytoin  steroid medicines like prednisone or cortisone  theophylline  vaccines This list may not describe all possible interactions. Give your health care provider a list of all the medicines, herbs, non-prescription drugs, or dietary supplements you use. Also tell them if you smoke, drink alcohol, or use illegal drugs. Some items may interact with your medicine. What should I watch for while using this medicine? Report any side effects that do not go away within 3 days to your doctor or health care professional. Call your health care provider if any unusual symptoms occur within 6 weeks of receiving this vaccine. You may still catch the flu, but the illness is not usually as bad. You cannot get the flu from the vaccine. The vaccine will not protect against colds or other illnesses that may cause fever. The vaccine is needed every year. What side effects may I notice from receiving this medicine? Side effects that you should report to your doctor or health care professional as soon as possible:  allergic reactions like skin rash, itching or hives, swelling of the face, lips, or tongue Side effects that usually do not require medical attention (Report these to your doctor or health care professional if they continue or are bothersome.):  fever  headache  muscle aches and pains  pain, tenderness, redness, or swelling at the injection site  tiredness This list may not describe all possible side effects. Call your doctor for medical advice about side effects. You may report side effects to FDA at 1-800-FDA-1088. Where should I keep my medicine? The vaccine will be given by a health care professional in a clinic, pharmacy, doctor's   office, or other health care setting. You will not be given vaccine doses to store at home. NOTE: This sheet is a summary. It may not cover all possible information. If you have questions about this medicine, talk to your doctor, pharmacist, or  health care provider.  2020 Elsevier/Gold Standard (2010-12-09 14:06:47)  

## 2018-11-02 NOTE — Progress Notes (Signed)
Subjective:     Patient ID: Stacy Wang , female    DOB: 29-May-1995 , 23 y.o.   MRN: [id number]   Chief Complaint  Patient presents with  . Weight Check    HPI  Wt Readings from Last 3 Encounters: 11/02/18 : 299 lb 3.2 oz (135.7 kg) 09/20/18 : 297 lb 12.8 oz (135.1 kg) 08/29/18 : 298 lb 9.6 oz (135.4 kg)  She was in Rehab in May 2019 - was going to psychiatrist, lithium, adderal, lorazepam. So her therapist recommends she not take the injections.    She is walking 3 miles 2 times a week.  She is eating oatmeal, steamed vegetables and small amount of carbs.      Past Medical History:  Diagnosis Date  . Bell palsy   . Bell's palsy   . Diabetes mellitus without complication ([place])    type 2  . Hypertension   . Migraine      Family History  Problem Relation Age of Onset  . Depression Mother   . Diabetes Father   . Heart disease Father      Current Outpatient Medications:  .  albuterol (PROVENTIL HFA;VENTOLIN HFA) 108 (90 Base) MCG/ACT inhaler, Inhale 1-2 puffs into the lungs every 6 (six) hours as needed for wheezing or shortness of breath., Disp: 1 Inhaler, Rfl: 0 .  amLODipine (NORVASC) 5 MG tablet, Take 1 tablet (5 mg total) by mouth daily., Disp: 90 tablet, Rfl: 1 .  fluticasone (FLONASE) 50 MCG/ACT nasal spray, Place 2 sprays into both nostrils daily., Disp: 16 g, Rfl: 0 .  Levonorgestrel-Ethinyl Estrad (VIENVA PO), Take by mouth., Disp: , Rfl:  .  metFORMIN (GLUCOPHAGE) 500 MG tablet, Take 1 tablet (500 mg total) by mouth 2 (two) times daily with a meal., Disp: 60 tablet, Rfl: 2 .  propranolol (INDERAL) 60 MG tablet, Take 60 mg by mouth 3 (three) times daily., Disp: , Rfl:  .  Liraglutide -Weight Management (SAXENDA) 18 MG/3ML SOPN, Inject 3 mg into the skin daily. (Patient not taking: Reported on 11/02/2018), Disp: 5 pen, Rfl: 1   Allergies  Allergen Reactions  . Pollen Extract Itching     Review of Systems  Constitutional: Negative.   Respiratory:  Negative.   Cardiovascular: Negative.  Negative for chest pain, palpitations and leg swelling.  Musculoskeletal: Negative.   Neurological: Negative for dizziness and headaches.  Psychiatric/Behavioral: Negative.      Today's Vitals   11/02/18 1512  BP: 116/68  Pulse: 85  Temp: 98.2 F (36.8 C)  TempSrc: Oral  Weight: 299 lb 3.2 oz (135.7 kg)  Height: '5\' 7"'  (1.702 m)  PainSc: 0-No pain   Body mass index is 46.86 kg/m.   Objective:  Physical Exam Constitutional:      General: She is not in acute distress.    Appearance: Normal appearance. She is obese.  Cardiovascular:     Rate and Rhythm: Normal rate and regular rhythm.     Pulses: Normal pulses.     Heart sounds: Normal heart sounds. No murmur.  Neurological:     Mental Status: She is alert.         Assessment And Plan:     1. Essential hypertension, benign Chronic fair control. Continue with current medications - CMP14+EGFR  2. Other abnormal glucose  Chronic, controlled  Continue with current medications  Encouraged to limit intake of sugary foods and drinks  Encouraged to continue physical activity increasing the time or intensity.  I had started  her on ozempic however after speaking with her therapist they recommend not using due to her history of drug abuse - Hemoglobin A1c - CMP14+EGFR  3. Class 2 obesity due to excess calories without serious comorbidity with body mass index (BMI) of 36.0 to 36.9 in adult  She is having a challenging time with losing weight encouraged to increase her frequency or time with exercise - Ambulatory referral to Sleep Studies  4. Snoring  This could be a sign of sleep apnea in conjunction with her body habitus  Will refer for a sleep study - Ambulatory referral to Sleep Studies  5. Other migraine without status migrainosus, not intractable  Will try her on trokendi XR due to having daily headaches  Discussed side effects of tingling fingers, memory fog and  weight loss - Topiramate ER (TROKENDI XR) 25 MG CP24; Take 1 capsule (25 mg total) by mouth daily.  Dispense: 30 capsule; Refill: 3  6. Need for influenza vaccination  Influenza vaccine given in office  Advised to take Tylenol as needed for muscle aches or fever - Flu Vaccine QUAD 6+ mos PF IM (Fluarix Quad PF)  7. History of drug abuse in remission [facility])  Reports history of taking multiple prescription medications in 2019 currently controlled and in remission  She is being followed by a therapist  Will avoid injectable medications due to risk potential    Minette Brine, FNP    THE PATIENT IS ENCOURAGED TO PRACTICE SOCIAL DISTANCING DUE TO THE COVID-19 PANDEMIC.

## 2018-11-03 LAB — CMP14+EGFR
ALT: 9 IU/L (ref 0–32)
AST: 13 IU/L (ref 0–40)
Albumin/Globulin Ratio: 1.4 (ref 1.2–2.2)
Albumin: 3.9 g/dL (ref 3.9–5.0)
Alkaline Phosphatase: 75 IU/L (ref 39–117)
BUN/Creatinine Ratio: 9 (ref 9–23)
BUN: 8 mg/dL (ref 6–20)
Bilirubin Total: <0.2 mg/dL (ref 0.0–1.2)
CO2: 21 mmol/L (ref 20–29)
Calcium: 9.2 mg/dL (ref 8.7–10.2)
Chloride: 104 mmol/L (ref 96–106)
Creatinine, Ser: 0.85 mg/dL (ref 0.57–1.00)
GFR calc Af Amer: 111 mL/min/{1.73_m2} (ref >59)
GFR calc non Af Amer: 96 mL/min/{1.73_m2} (ref >59)
Globulin, Total: 2.7 g/dL (ref 1.5–4.5)
Glucose: 234 mg/dL — ABNORMAL HIGH (ref 65–99)
Potassium: 4.3 mmol/L (ref 3.5–5.2)
Sodium: 139 mmol/L (ref 134–144)
Total Protein: 6.6 g/dL (ref 6.0–8.5)

## 2018-11-03 LAB — HEMOGLOBIN A1C
Est. average glucose Bld gHb Est-mCnc: 154 mg/dL
Hgb A1c MFr Bld: 7 % — ABNORMAL HIGH (ref 4.8–5.6)

## 2018-11-07 ENCOUNTER — Ambulatory Visit (INDEPENDENT_AMBULATORY_CARE_PROVIDER_SITE_OTHER): Payer: BC Managed Care – PPO | Admitting: Neurology

## 2018-11-07 ENCOUNTER — Encounter: Payer: Self-pay | Admitting: Neurology

## 2018-11-07 ENCOUNTER — Other Ambulatory Visit: Payer: Self-pay

## 2018-11-07 VITALS — BP 128/92 | HR 98 | Temp 97.3°F | Ht 67.0 in | Wt 299.0 lb

## 2018-11-07 DIAGNOSIS — F518 Other sleep disorders not due to a substance or known physiological condition: Secondary | ICD-10-CM

## 2018-11-07 DIAGNOSIS — G510 Bell's palsy: Secondary | ICD-10-CM | POA: Diagnosis not present

## 2018-11-07 DIAGNOSIS — R0683 Snoring: Secondary | ICD-10-CM

## 2018-11-07 DIAGNOSIS — H53489 Generalized contraction of visual field, unspecified eye: Secondary | ICD-10-CM

## 2018-11-07 DIAGNOSIS — G4733 Obstructive sleep apnea (adult) (pediatric): Secondary | ICD-10-CM

## 2018-11-07 DIAGNOSIS — F332 Major depressive disorder, recurrent severe without psychotic features: Secondary | ICD-10-CM | POA: Diagnosis not present

## 2018-11-07 DIAGNOSIS — G3281 Cerebellar ataxia in diseases classified elsewhere: Secondary | ICD-10-CM

## 2018-11-07 DIAGNOSIS — F5104 Psychophysiologic insomnia: Secondary | ICD-10-CM

## 2018-11-07 DIAGNOSIS — Z6841 Body Mass Index (BMI) 40.0 and over, adult: Secondary | ICD-10-CM

## 2018-11-07 NOTE — Patient Instructions (Signed)
Sleep Studies A sleep study (polysomnogram) is a series of tests done while you are sleeping. A sleep study records your brain waves, heart rate, breathing rate, oxygen level, and eye and leg movements. A sleep study helps your health care provider:  See how well you sleep.  Diagnose a sleep disorder.  Determine how severe your sleep disorder is.  Create a plan to treat your sleep disorder. Your health care provider may recommend a sleep study if you:  Feel sleepy on most days.  Snore loudly while sleeping.  Have unusual behaviors while you sleep, such as walking.  Have brief periods in which you stop breathing during sleep (sleepapnea).  Fall asleep suddenly during the day (narcolepsy).  Have trouble falling asleep or staying asleep (insomnia).  Feel like you need to move your legs when trying to fall asleep (restless legs syndrome).  Move your legs by flexing and extending them regularly while asleep (periodic limb movement disorder).  Act out your dreams while you sleep (sleep behavior disorder).  Feel like you cannot move when you first wake up (sleep paralysis). What tests are part of a sleep study? Most sleep studies record the following during sleep:  Brain activity.  Eye movements.  Heart rate and rhythm.  Breathing rate and rhythm.  Blood-oxygen level.  Blood pressure.  Chest and belly movement as you breathe.  Arm and leg movements.  Snoring or other noises.  Body position. Where are sleep studies done? Sleep studies are done at sleep centers. A sleep center may be inside a hospital, office, or clinic. The room where you have the study may look like a hospital room or a hotel room. The health care providers doing the study may come in and out of the room during the study. Most of the time, they will be in another room monitoring your test as you sleep. How are sleep studies done? Most sleep studies are done during a normal period of time for a full  night of sleep. You will arrive at the study center in the evening and go home in the morning. Before the test  Bring your pajamas and toothbrush with you to the sleep study.  Do not have caffeine on the day of your sleep study.  Do not drink alcohol on the day of your sleep study.  Your health care provider will let you know if you should stop taking any of your regular medicines before the test. During the test      Round, sticky patches with sensors attached to recording wires (electrodes) are placed on your scalp, face, chest, and limbs.  Wires from all the electrodes and sensors run from your bed to a computer. The wires can be taken off and put back on if you need to get out of bed to go to the bathroom.  A sensor is placed over your nose to measure airflow.  A finger clip is put on your finger or ear to measure your blood oxygen level (pulse oximetry).  A belt is placed around your belly and a belt is placed around your chest to measure breathing movements.  If you have signs of the sleep disorder called sleep apnea during your test, you may get a treatment mask to wear for the second half of the night. ? The mask provides positive airway pressure (PAP) to help you breathe better during sleep. This may greatly improve your sleep apnea. ? You will then have all tests done again with the mask   in place to see if your measurements and recordings change. After the test  A medical doctor who specializes in sleep will evaluate the results of your sleep study and share them with you and your primary health care provider.  Based on your results, your medical history, and a physical exam, you may be diagnosed with a sleep disorder, such as: ? Sleep apnea. ? Restless legs syndrome. ? Sleep-related behavior disorder. ? Sleep-related movement disorders. ? Sleep-related seizure disorders.  Your health care team will help determine your treatment options based on your diagnosis. This  may include: ? Improving your sleep habits (sleep hygiene). ? Wearing a continuous positive airway pressure (CPAP) or bi-level positive airway pressure (BPAP) mask. ? Wearing an oral device at night to improve breathing and reduce snoring. ? Taking medicines. Follow these instructions at home:  Take over-the-counter and prescription medicines only as told by your health care provider.  If you are instructed to use a CPAP or BPAP mask, make sure you use it nightly as directed.  Make any lifestyle changes that your health care provider recommends.  If you were given a device to open your airway while you sleep, use it only as told by your health care provider.  Do not use any tobacco products, such as cigarettes, chewing tobacco, and e-cigarettes. If you need help quitting, ask your health care provider.  Keep all follow-up visits as told by your health care provider. This is important. Summary  A sleep study (polysomnogram) is a series of tests done while you are sleeping. It shows how well you sleep.  Most sleep studies are done over one full night of sleep. You will arrive at the study center in the evening and go home in the morning.  If you have signs of the sleep disorder called sleep apnea during your test, you may get a treatment mask to wear for the second half of the night.  A medical doctor who specializes in sleep will evaluate the results of your sleep study and share them with your primary health care provider. This information is not intended to replace advice given to you by your health care provider. Make sure you discuss any questions you have with your health care provider. Document Released: 07/04/2002 Document Revised: 10/14/2017 Document Reviewed: 01/25/2017 Elsevier Patient Education  2020 [employer]  

## 2018-11-07 NOTE — Progress Notes (Signed)
SLEEP MEDICINE CLINIC    Provider:  Melvyn Novasarmen  Remedy Corporan, MD  Primary Care Physician:  Dorothyann PengSanders, Robyn, MD [address] [place] [id number]     Referring Provider: Dorothyann PengSanders, Robyn, Md [address] [place] [id number]          Chief Complaint according to patient   Patient presents with:    . New Patient (Initial Visit)           HISTORY OF PRESENT ILLNESS:  Stacy Wang is a 23 y.o. year old  African American female patient seen on 11/07/2018  for a sleep medicine consultation.  Chief concern according to patient :  "my family and  friends have reported that I stop breathing in my sleep, snoring. She has been unable to loose weight and questions of sleep can be optimized. She sufferes from Migraine and changed recently from propranolol to XR Topiramate.    I have the pleasure of seeing Stacy Wang today, a right handed Black or [place] female with a possible sleep disorder.  She has a  has a past medical history of Bipolar Depression,  Bell's palsy, Diabetes mellitus without complication (HCC), Hypertension, and Migraine.  She is considered morbidly obese BMI 46.83 .  The patient never had a sleep study. She has cyclic insomnia.     Family medical /sleep history: no other family member has OSA, but mother might -  Brother age 25 with insomnia, personal history of sleep walking.    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  She lives in a household alone.  Family status is single. The patient currently works from home. Tobacco use; quit 05-2017 , vapor use -  ETOH use : none -  Caffeine intake in form of Coffee( none) Soda( none) Tea ( 40 ounces of green tea daily) or energy drinks. Regular exercise- will start walking.    Sleep habits are as follows: The patient's dinner time is between 7-8 PM. The patient goes to bed at 10.30 PM and  Asleep by 1 AM- continues to sleep for 6 hours, wakes  rarely for bathroom breaks. She often wakes earlier than intended.  The preferred sleep position is on her sides, but she finds herself on her back when she wakes up.  with the support of 4-5 pillows and a weighted blanket.  Dreams are reportedly frequent/vivid. Has nightmares about work.  7.30 AM is the usual rise time. The patient wakes up spontaneously She reports not feeling refreshed or restored in AM, with symptoms such as dry mouth , morning headaches but not woken by headaches. Most morning there is some residual fatigue.  Naps are taken infrequently, they make her groggy.     Review of Systems: Out of a complete 14 system review, the patient complains of only the following symptoms, and all other reviewed systems are negative.:  Fatigue, sleepiness , snoring, fragmented sleep, Insomnia    How likely are you to doze in the following situations:  Cyclic - has trouble to go to sleep.   Uses fit bit.   0 = not likely, 1 = slight chance, 2 = moderate chance, 3 = high chance   Sitting and Reading? Watching Television? Sitting inactive in a public place (theater or meeting)? As a passenger in a car for an hour without a break? Lying down in the afternoon when circumstances permit? Sitting and talking to someone? Sitting quietly after lunch  without alcohol? In a car, while stopped for a few minutes in traffic?   Total = 1/ 24 points   FSS endorsed at 34/ 63 points.   Social History   Socioeconomic History  . Marital status: Single    Spouse name: Not on file  . Number of children: 0  . Years of education: Some colle  . Highest education level: Not on file  Occupational History  . Occupation: Personal assistant - Clinical biochemist  Social Needs  . Financial resource strain: Not on file  . Food insecurity    Worry: Not on file    Inability: Not on file  . Transportation needs    Medical: Not on file    Non-medical: Not on file  Tobacco Use  . Smoking status: Former Smoker     Packs/day: 0.25    Years: 2.00    Pack years: 0.50    Types: E-cigarettes    Quit date: 05/15/2017    Years since quitting: 1.4  . Smokeless tobacco: Never Used  Substance and Sexual Activity  . Alcohol use: Not Currently    Alcohol/week: 0.0 standard drinks    Comment: 1/2 bottle liquor in one day for the week  . Drug use: No  . Sexual activity: Not Currently  Lifestyle  . Physical activity    Days per week: Not on file    Minutes per session: Not on file  . Stress: Not on file  Relationships  . Social Musician on phone: Not on file    Gets together: Not on file    Attends religious service: Not on file    Active member of club or organization: Not on file    Attends meetings of clubs or organizations: Not on file    Relationship status: Not on file  Other Topics Concern  . Not on file  Social History Narrative   Right handed.   Lives at home with roommates.   Caffeine occasionally - not daily.    Family History  Problem Relation Age of Onset  . Depression Mother   . Diabetes Father   . Heart disease Father     Past Medical History:  Diagnosis Date  . Bell palsy   . Bell's palsy   . Diabetes mellitus without complication (HCC)    type 2  . Hypertension   . Migraine     Past Surgical History:  Procedure Laterality Date  . WISDOM TOOTH EXTRACTION       Current Outpatient Medications on File Prior to Visit  Medication Sig Dispense Refill  . albuterol (PROVENTIL HFA;VENTOLIN HFA) 108 (90 Base) MCG/ACT inhaler Inhale 1-2 puffs into the lungs every 6 (six) hours as needed for wheezing or shortness of breath. 1 Inhaler 0  . amLODipine (NORVASC) 5 MG tablet Take 1 tablet (5 mg total) by mouth daily. 90 tablet 1  . fluticasone (FLONASE) 50 MCG/ACT nasal spray Place 2 sprays into both nostrils daily. 16 g 0  . Levonorgestrel-Ethinyl Estrad (VIENVA PO) Take by mouth.    . metFORMIN (GLUCOPHAGE) 500 MG tablet Take 1 tablet (500 mg total) by mouth 2 (two)  times daily with a meal. 60 tablet 2  . Multiple Vitamin (MULTI-VITAMIN DAILY PO) Multi Vitamin  daily    . propranolol (INDERAL) 60 MG tablet Take 60 mg by mouth 3 (three) times daily.    . SUMAtriptan (IMITREX) 50 MG tablet Take by mouth.    . Topiramate ER (TROKENDI XR) 25  MG CP24 Take 1 capsule (25 mg total) by mouth daily. 30 capsule 3   No current facility-administered medications on file prior to visit.     Allergies  Allergen Reactions  . Pollen Extract Itching    Physical exam:  Today's Vitals   11/07/18 1520  BP: (!) 128/92  Pulse: 98  Temp: (!) 97.3 F (36.3 C)  Weight: 299 lb (135.6 kg)  Height:  (1.702 m)   Body mass index is 46.83 kg/m.   Wt Readings from Last 3 Encounters:  11/07/18 299 lb (135.6 kg)  11/02/18 299 lb 3.2 oz (135.7 kg)  09/20/18 297 lb 12.8 oz (135.1 kg)     Ht Readings from Last 3 Encounters:  11/07/18  (1.702 m)  11/02/18  (1.702 m)  09/20/18 5' 7.4" (1.712 m)      General: The patient is awake, alert and appears not in acute distress. The patient is well groomed. Head: Normocephalic, atraumatic. Neck is supple. Mallampati : 2- large tonsils.   neck circumference: 18 inches . Nasal airflow patent.  Retrognathia is not  seen.  Dental status: intact  Cardiovascular:  Regular rate and cardiac rhythm by pulse,  without distended neck veins. Respiratory: Lungs are clear to auscultation.  Skin:  Without evidence of ankle edema, or rash. Trunk: The patient's posture is erect.   Neurologic exam : The patient is awake and alert, oriented to place and time.   Memory subjective described as intact. Attention span & concentration ability appears normal.  Speech is fluent,  without  dysarthria, dysphonia or aphasia.  Mood and affect are appropriate.   Cranial nerves: no loss of smell or taste reported  Pupils are equal and briskly reactive to light. Funduscopic exam deferred.  Extraocular movements in vertical and horizontal  planes were intact and without nystagmus. No Diplopia. Visual fields by finger perimetry are intact. Hearing was intact to soft voice and finger rubbing.   Facial sensation intact to fine touch. Facial motor strength is asymmetric- she had a right facial droop=   and tongue and uvula move midline. Neck ROM : rotation, tilt and flexion extension were normal for age and shoulder shrug was symmetrical.    Motor exam:  Symmetric bulk, tone and ROM.   Normal tone without cog wheeling, symmetric grip strength .   Sensory:  Fine touch, pinprick and vibration were tested  and  normal.  Proprioception tested in the upper extremities was normal.   Coordination:  She reports running into doors, clumsy, penmanship has changed, declined. Rapid alternating movements in the fingers/hands were of normal speed.  The Finger-to-nose maneuver was intact without evidence of ataxia, dysmetria or tremor.   Gait and station: Patient could rise unassisted from a seated position, walked without assistive device.  Stance is of normal width/ base and the patient turned with 4 steps.  Toe and heel walk were deferred.  Can't stand on one leg for 12 seconds at age 13 !  Deep tendon reflexes: in the  upper and lower extremities are symmetric and intact.  Babinski response was deferred.       After spending a total time of  45 minutes face to face and additional time for physical and neurologic examination, review of laboratory studies,  personal review of imaging studies, reports and results of other testing and review of referral information / records as far as provided in visit, I have established the following assessments:  1)  Suspected visual field deficit  by her recollection of running into things. Balance problems/ right side  2)  Very high risk for sleep apnea, based on BMI, neck, upper airway and large tonsils. Marland Kitchen  3)  Bipolar depression contributes to cyclic insomnia. Delayed sleep phases.    My Plan is to  proceed with:  1) attended sleep study for SPLIT night, but with EEG montage for active dreams, yelling, sleep talking.  2) MRI brain for visual field deficit.    I would like to thank Glendale Chard, MD  and Darleen Crocker, NP  [address] [place],  [place] [address] for allowing me to meet with and to take care of this pleasant patient.   In short, Stacy Wang is presenting with insomnia rather than hypersomnia - a symptom that can be attributed to depression, mood disorder- but has many risk factors for OSA.   I plan to follow up either personally or through our NP within 3 month.   CC: I will share my notes with PCP   Electronically signed by: Larey Seat, MD 11/07/2018 3:54 PM  [facility] Sleep Board certified by The [employer] of Sleep Medicine and Diplomate of the [employer] of Sleep Medicine. Board certified In Neurology through the [employer], Fellow of the [employer] of Neurology. Medical Director of [employer].

## 2018-11-08 DIAGNOSIS — F4311 Post-traumatic stress disorder, acute: Secondary | ICD-10-CM | POA: Diagnosis not present

## 2018-11-08 DIAGNOSIS — F332 Major depressive disorder, recurrent severe without psychotic features: Secondary | ICD-10-CM | POA: Diagnosis not present

## 2018-11-08 DIAGNOSIS — Z634 Disappearance and death of family member: Secondary | ICD-10-CM | POA: Diagnosis not present

## 2018-11-10 ENCOUNTER — Other Ambulatory Visit: Payer: Self-pay | Admitting: Nurse Practitioner

## 2018-11-13 ENCOUNTER — Telehealth: Payer: Self-pay | Admitting: Neurology

## 2018-11-13 NOTE — Telephone Encounter (Signed)
LVM for pt to call back about scheduling mri  [employer] auth: [id number] (exp. 11/13/18 to 12/12/18)  [place]: [id number] (exp. 11/13/18 to 05/11/19)

## 2018-11-14 DIAGNOSIS — F332 Major depressive disorder, recurrent severe without psychotic features: Secondary | ICD-10-CM | POA: Diagnosis not present

## 2018-11-14 DIAGNOSIS — Z634 Disappearance and death of family member: Secondary | ICD-10-CM | POA: Diagnosis not present

## 2018-11-14 DIAGNOSIS — F4311 Post-traumatic stress disorder, acute: Secondary | ICD-10-CM | POA: Diagnosis not present

## 2018-11-15 DIAGNOSIS — Z634 Disappearance and death of family member: Secondary | ICD-10-CM | POA: Diagnosis not present

## 2018-11-15 DIAGNOSIS — F4311 Post-traumatic stress disorder, acute: Secondary | ICD-10-CM | POA: Diagnosis not present

## 2018-11-15 DIAGNOSIS — F332 Major depressive disorder, recurrent severe without psychotic features: Secondary | ICD-10-CM | POA: Diagnosis not present

## 2018-11-15 NOTE — Telephone Encounter (Signed)
Patient returned my call she is scheduled at [facility] for 11/21/18 no to the covid questions.

## 2018-11-21 ENCOUNTER — Other Ambulatory Visit: Payer: Self-pay

## 2018-11-21 ENCOUNTER — Ambulatory Visit (INDEPENDENT_AMBULATORY_CARE_PROVIDER_SITE_OTHER): Payer: BC Managed Care – PPO

## 2018-11-21 DIAGNOSIS — G3281 Cerebellar ataxia in diseases classified elsewhere: Secondary | ICD-10-CM

## 2018-11-21 DIAGNOSIS — Z634 Disappearance and death of family member: Secondary | ICD-10-CM | POA: Diagnosis not present

## 2018-11-21 DIAGNOSIS — F4311 Post-traumatic stress disorder, acute: Secondary | ICD-10-CM | POA: Diagnosis not present

## 2018-11-21 DIAGNOSIS — F332 Major depressive disorder, recurrent severe without psychotic features: Secondary | ICD-10-CM | POA: Diagnosis not present

## 2018-11-22 DIAGNOSIS — F332 Major depressive disorder, recurrent severe without psychotic features: Secondary | ICD-10-CM | POA: Diagnosis not present

## 2018-11-22 DIAGNOSIS — Z634 Disappearance and death of family member: Secondary | ICD-10-CM | POA: Diagnosis not present

## 2018-11-22 DIAGNOSIS — F4311 Post-traumatic stress disorder, acute: Secondary | ICD-10-CM | POA: Diagnosis not present

## 2018-11-23 ENCOUNTER — Encounter: Payer: Self-pay | Admitting: Neurology

## 2018-11-23 ENCOUNTER — Telehealth: Payer: Self-pay | Admitting: Neurology

## 2018-11-23 NOTE — Telephone Encounter (Signed)
Called the patient. There was no answer. LVM informing the patient that her MRI brain was normal. Advised if she had questions to call.   If pt returns call please inform that Dr Brett Fairy reviewed the MRI of brain and it was normal. There was nothing noted of clinical concern.

## 2018-11-23 NOTE — Telephone Encounter (Signed)
-----   Message from Larey Seat, MD sent at 11/23/2018 12:44 PM EST ----- No abnormal lesions are seen on diffusion-weighted views to suggest acute ischemia. The cortical sulci, fissures and cisterns are normal in size and appearance. Lateral, third and fourth ventricle are normal in size and appearance. No extra-axial fluid collections are seen. No evidence of mass effect or midline shift.    On sagittal views the posterior fossa, pituitary gland and corpus callosum are unremarkable. No evidence of intracranial hemorrhage on gradient echo views. The orbits and their contents, paranasal sinuses and calvarium are unremarkable.  Intracranial flow voids are present.   IMPRESSION:   Normal MRI brain (without).

## 2018-11-28 DIAGNOSIS — F332 Major depressive disorder, recurrent severe without psychotic features: Secondary | ICD-10-CM | POA: Diagnosis not present

## 2018-11-28 DIAGNOSIS — F4311 Post-traumatic stress disorder, acute: Secondary | ICD-10-CM | POA: Diagnosis not present

## 2018-11-28 DIAGNOSIS — Z634 Disappearance and death of family member: Secondary | ICD-10-CM | POA: Diagnosis not present

## 2018-11-29 DIAGNOSIS — F4311 Post-traumatic stress disorder, acute: Secondary | ICD-10-CM | POA: Diagnosis not present

## 2018-11-29 DIAGNOSIS — Z634 Disappearance and death of family member: Secondary | ICD-10-CM | POA: Diagnosis not present

## 2018-11-29 DIAGNOSIS — F332 Major depressive disorder, recurrent severe without psychotic features: Secondary | ICD-10-CM | POA: Diagnosis not present

## 2018-11-29 NOTE — Telephone Encounter (Signed)
Attempted a 3rd time to reach out. There was no answer. I will send the patient a letter informing that results were sent on mychart.

## 2018-12-12 DIAGNOSIS — Z634 Disappearance and death of family member: Secondary | ICD-10-CM | POA: Diagnosis not present

## 2018-12-12 DIAGNOSIS — F4311 Post-traumatic stress disorder, acute: Secondary | ICD-10-CM | POA: Diagnosis not present

## 2018-12-12 DIAGNOSIS — F332 Major depressive disorder, recurrent severe without psychotic features: Secondary | ICD-10-CM | POA: Diagnosis not present

## 2018-12-13 ENCOUNTER — Telehealth: Payer: Self-pay

## 2018-12-13 ENCOUNTER — Encounter: Payer: Self-pay | Admitting: Nurse Practitioner

## 2018-12-13 DIAGNOSIS — F332 Major depressive disorder, recurrent severe without psychotic features: Secondary | ICD-10-CM | POA: Diagnosis not present

## 2018-12-13 DIAGNOSIS — Z634 Disappearance and death of family member: Secondary | ICD-10-CM | POA: Diagnosis not present

## 2018-12-13 DIAGNOSIS — F4311 Post-traumatic stress disorder, acute: Secondary | ICD-10-CM | POA: Diagnosis not present

## 2018-12-13 NOTE — Telephone Encounter (Signed)
Call pt per provider ask pt why did she stop taking her amlodipine medication? Per pt she was experiencing side effects, she spoke w/another provider they advised her to stop taking the medication to see If that would help. Side effects she stated she was experiencing was chest tightness, migraines were getting worse. Pt stated she has been off the medication for 2wks

## 2018-12-19 DIAGNOSIS — F332 Major depressive disorder, recurrent severe without psychotic features: Secondary | ICD-10-CM | POA: Diagnosis not present

## 2018-12-19 DIAGNOSIS — F4311 Post-traumatic stress disorder, acute: Secondary | ICD-10-CM | POA: Diagnosis not present

## 2018-12-19 DIAGNOSIS — Z634 Disappearance and death of family member: Secondary | ICD-10-CM | POA: Diagnosis not present

## 2019-01-01 ENCOUNTER — Other Ambulatory Visit: Payer: Self-pay

## 2019-01-01 ENCOUNTER — Ambulatory Visit: Payer: BC Managed Care – PPO | Admitting: Nurse Practitioner

## 2019-01-02 ENCOUNTER — Ambulatory Visit: Payer: BC Managed Care – PPO | Admitting: Nurse Practitioner

## 2019-01-16 DIAGNOSIS — F332 Major depressive disorder, recurrent severe without psychotic features: Secondary | ICD-10-CM | POA: Diagnosis not present

## 2019-01-16 DIAGNOSIS — Z634 Disappearance and death of family member: Secondary | ICD-10-CM | POA: Diagnosis not present

## 2019-01-16 DIAGNOSIS — F4311 Post-traumatic stress disorder, acute: Secondary | ICD-10-CM | POA: Diagnosis not present

## 2019-01-23 DIAGNOSIS — Z634 Disappearance and death of family member: Secondary | ICD-10-CM | POA: Diagnosis not present

## 2019-01-23 DIAGNOSIS — F4311 Post-traumatic stress disorder, acute: Secondary | ICD-10-CM | POA: Diagnosis not present

## 2019-01-23 DIAGNOSIS — F332 Major depressive disorder, recurrent severe without psychotic features: Secondary | ICD-10-CM | POA: Diagnosis not present

## 2019-02-01 ENCOUNTER — Other Ambulatory Visit: Payer: Self-pay

## 2019-02-01 ENCOUNTER — Encounter: Payer: Self-pay | Admitting: Dietician

## 2019-02-01 ENCOUNTER — Encounter: Payer: BC Managed Care – PPO | Attending: Nurse Practitioner | Admitting: Dietician

## 2019-02-01 DIAGNOSIS — E119 Type 2 diabetes mellitus without complications: Secondary | ICD-10-CM

## 2019-02-01 NOTE — Patient Instructions (Addendum)
Fruit   Stick to 3 or less total servings per day   Have only 1 or 2 servings at a time   Eat protein at every meal/snack  If you do a protein shake, have no more than 2 a day   Good options include seafood, nuts, peanut butter, beans, lentils, eggs, Greek yogurt, cheese, beef jerky, chicken, deli [place]   Eat at least a little bit of carbohydrates at every meal/snack   Remember: no food is bad!! Enjoy everything you eat!

## 2019-02-01 NOTE — Progress Notes (Signed)
Medical Nutrition Therapy  Follow-Up Visit Appt Start Time: 10:05am   End Time: 10:40am  Patient was originally seen on 09/06/2018 for diabetes and weight management with a follow up visit on 11/01/2018.  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.   Typical meal pattern is 3 meals per day. This is improved from previously only eating 1 meal per day. States she prefers to exclude meat from her diet as much as possible. States she has continued to do well with being intentional about eating/choosing healthful foods and managing portion sizes (eating until satisfied and not full.) Is concerned about eating any foods that taste good, states she does not think a food can both taste good and be good for you.   May have a sandwich, protein shake, cheese and beef dip, or Korean dish for meals. May snack on beef jerky or a protein shake if on the go. We discussed balanced snacks/meals to include protein and carbohydrates. Fruits okay to have, just stick to no more than 1-2 servings at a time, and no more than 3 total servings per day. Pt states she may sometimes have oatmeal with dried fruit and honey, so we brainstormed protein options to pair with this.   24-Hr Dietary Recall  First Meal: protein shake + green tea Second Meal: stuffed peppers  Third Meal: [place] dish  Estimated Energy Needs Calories: 2000-2200 Carbohydrate: 225-248g Protein: 125-138g Fat: 67-73g   NUTRITION DIAGNOSIS  Excessive carbohydrate intake (NI-5.8.2) related to food/nutrition related knowledge deficit as evidenced by HgbA1c level of 7.0% (11/02/2018),  reported dietary intake of types of carbohydrates inconsistent with estimated needs, and reported binge eating type meal pattern.   NUTRITION INTERVENTION  Nutrition education (E-1) on the following topics:   Consistent carbohydrate intake throughout the day   Importance of including protein at all meals/snacks   Handouts Provided Include   Balanced Snacks  Learning Style & Readiness for Change Teaching method utilized: Visual & Auditory  Demonstrated degree of understanding via: Teach Back  Barriers to learning/adherence to lifestyle change: relationship with food (this is getting better)   Progress/ Updates on Pt's Goals . Eating 3 meals/day! (More than 1/day)  . Working on eating more mindfully  . Keeping grams of sugar in the single digits per serving  . Working on incorporating more protein at meal times (already including protein shakes and beef jerky)  . NEW: Eat carbs and protein at every meal/snack    MONITORING & EVALUATION Dietary intake, weekly physical activity, and goals in 3 months.  Next Steps  Patient is to return to [facility] for follow up appointment in 3 months.

## 2019-02-05 ENCOUNTER — Other Ambulatory Visit: Payer: Self-pay | Admitting: Nurse Practitioner

## 2019-02-06 DIAGNOSIS — F332 Major depressive disorder, recurrent severe without psychotic features: Secondary | ICD-10-CM | POA: Diagnosis not present

## 2019-02-06 DIAGNOSIS — Z634 Disappearance and death of family member: Secondary | ICD-10-CM | POA: Diagnosis not present

## 2019-02-06 DIAGNOSIS — F4311 Post-traumatic stress disorder, acute: Secondary | ICD-10-CM | POA: Diagnosis not present

## 2019-02-13 DIAGNOSIS — Z634 Disappearance and death of family member: Secondary | ICD-10-CM | POA: Diagnosis not present

## 2019-02-13 DIAGNOSIS — F4311 Post-traumatic stress disorder, acute: Secondary | ICD-10-CM | POA: Diagnosis not present

## 2019-02-13 DIAGNOSIS — F332 Major depressive disorder, recurrent severe without psychotic features: Secondary | ICD-10-CM | POA: Diagnosis not present

## 2019-02-20 DIAGNOSIS — Z634 Disappearance and death of family member: Secondary | ICD-10-CM | POA: Diagnosis not present

## 2019-02-20 DIAGNOSIS — F4311 Post-traumatic stress disorder, acute: Secondary | ICD-10-CM | POA: Diagnosis not present

## 2019-02-20 DIAGNOSIS — F332 Major depressive disorder, recurrent severe without psychotic features: Secondary | ICD-10-CM | POA: Diagnosis not present

## 2019-02-27 DIAGNOSIS — F332 Major depressive disorder, recurrent severe without psychotic features: Secondary | ICD-10-CM | POA: Diagnosis not present

## 2019-02-27 DIAGNOSIS — F4311 Post-traumatic stress disorder, acute: Secondary | ICD-10-CM | POA: Diagnosis not present

## 2019-02-27 DIAGNOSIS — Z634 Disappearance and death of family member: Secondary | ICD-10-CM | POA: Diagnosis not present

## 2019-02-28 DIAGNOSIS — Z01411 Encounter for gynecological examination (general) (routine) with abnormal findings: Secondary | ICD-10-CM | POA: Diagnosis not present

## 2019-02-28 DIAGNOSIS — Z3040 Encounter for surveillance of contraceptives, unspecified: Secondary | ICD-10-CM | POA: Diagnosis not present

## 2019-02-28 DIAGNOSIS — B373 Candidiasis of vulva and vagina: Secondary | ICD-10-CM | POA: Diagnosis not present

## 2019-02-28 DIAGNOSIS — Z6841 Body Mass Index (BMI) 40.0 and over, adult: Secondary | ICD-10-CM | POA: Diagnosis not present

## 2019-03-13 DIAGNOSIS — F4311 Post-traumatic stress disorder, acute: Secondary | ICD-10-CM | POA: Diagnosis not present

## 2019-03-13 DIAGNOSIS — Z634 Disappearance and death of family member: Secondary | ICD-10-CM | POA: Diagnosis not present

## 2019-03-13 DIAGNOSIS — F332 Major depressive disorder, recurrent severe without psychotic features: Secondary | ICD-10-CM | POA: Diagnosis not present

## 2019-03-20 DIAGNOSIS — Z634 Disappearance and death of family member: Secondary | ICD-10-CM | POA: Diagnosis not present

## 2019-03-20 DIAGNOSIS — F4311 Post-traumatic stress disorder, acute: Secondary | ICD-10-CM | POA: Diagnosis not present

## 2019-03-20 DIAGNOSIS — F332 Major depressive disorder, recurrent severe without psychotic features: Secondary | ICD-10-CM | POA: Diagnosis not present

## 2019-03-27 DIAGNOSIS — Z634 Disappearance and death of family member: Secondary | ICD-10-CM | POA: Diagnosis not present

## 2019-03-27 DIAGNOSIS — F332 Major depressive disorder, recurrent severe without psychotic features: Secondary | ICD-10-CM | POA: Diagnosis not present

## 2019-03-27 DIAGNOSIS — F4311 Post-traumatic stress disorder, acute: Secondary | ICD-10-CM | POA: Diagnosis not present

## 2019-04-03 DIAGNOSIS — F332 Major depressive disorder, recurrent severe without psychotic features: Secondary | ICD-10-CM | POA: Diagnosis not present

## 2019-04-03 DIAGNOSIS — Z634 Disappearance and death of family member: Secondary | ICD-10-CM | POA: Diagnosis not present

## 2019-04-03 DIAGNOSIS — F4311 Post-traumatic stress disorder, acute: Secondary | ICD-10-CM | POA: Diagnosis not present

## 2019-04-04 DIAGNOSIS — I10 Essential (primary) hypertension: Secondary | ICD-10-CM | POA: Diagnosis not present

## 2019-04-04 DIAGNOSIS — E119 Type 2 diabetes mellitus without complications: Secondary | ICD-10-CM | POA: Diagnosis not present

## 2019-04-04 DIAGNOSIS — E669 Obesity, unspecified: Secondary | ICD-10-CM | POA: Diagnosis not present

## 2019-04-04 DIAGNOSIS — R946 Abnormal results of thyroid function studies: Secondary | ICD-10-CM | POA: Diagnosis not present

## 2019-04-17 DIAGNOSIS — Z634 Disappearance and death of family member: Secondary | ICD-10-CM | POA: Diagnosis not present

## 2019-04-17 DIAGNOSIS — F332 Major depressive disorder, recurrent severe without psychotic features: Secondary | ICD-10-CM | POA: Diagnosis not present

## 2019-04-17 DIAGNOSIS — F4311 Post-traumatic stress disorder, acute: Secondary | ICD-10-CM | POA: Diagnosis not present

## 2019-04-24 DIAGNOSIS — F4311 Post-traumatic stress disorder, acute: Secondary | ICD-10-CM | POA: Diagnosis not present

## 2019-04-24 DIAGNOSIS — F332 Major depressive disorder, recurrent severe without psychotic features: Secondary | ICD-10-CM | POA: Diagnosis not present

## 2019-04-24 DIAGNOSIS — Z634 Disappearance and death of family member: Secondary | ICD-10-CM | POA: Diagnosis not present

## 2019-04-26 ENCOUNTER — Telehealth: Payer: Self-pay

## 2019-04-26 NOTE — Telephone Encounter (Signed)
Called pt to see if she is still taking Metformin letter was received that she was not filling her prescription LVM for pt to call the office

## 2019-05-03 ENCOUNTER — Ambulatory Visit: Payer: BC Managed Care – PPO | Admitting: Dietician

## 2019-05-08 DIAGNOSIS — Z634 Disappearance and death of family member: Secondary | ICD-10-CM | POA: Diagnosis not present

## 2019-05-08 DIAGNOSIS — F332 Major depressive disorder, recurrent severe without psychotic features: Secondary | ICD-10-CM | POA: Diagnosis not present

## 2019-05-08 DIAGNOSIS — F4311 Post-traumatic stress disorder, acute: Secondary | ICD-10-CM | POA: Diagnosis not present

## 2019-05-15 DIAGNOSIS — F332 Major depressive disorder, recurrent severe without psychotic features: Secondary | ICD-10-CM | POA: Diagnosis not present

## 2019-05-15 DIAGNOSIS — Z634 Disappearance and death of family member: Secondary | ICD-10-CM | POA: Diagnosis not present

## 2019-05-15 DIAGNOSIS — F4311 Post-traumatic stress disorder, acute: Secondary | ICD-10-CM | POA: Diagnosis not present

## 2019-05-15 DIAGNOSIS — F4312 Post-traumatic stress disorder, chronic: Secondary | ICD-10-CM | POA: Diagnosis not present

## 2019-05-22 DIAGNOSIS — F4311 Post-traumatic stress disorder, acute: Secondary | ICD-10-CM | POA: Diagnosis not present

## 2019-05-22 DIAGNOSIS — F4312 Post-traumatic stress disorder, chronic: Secondary | ICD-10-CM | POA: Diagnosis not present

## 2019-05-22 DIAGNOSIS — Z634 Disappearance and death of family member: Secondary | ICD-10-CM | POA: Diagnosis not present

## 2019-05-22 DIAGNOSIS — F332 Major depressive disorder, recurrent severe without psychotic features: Secondary | ICD-10-CM | POA: Diagnosis not present

## 2019-05-29 DIAGNOSIS — F4312 Post-traumatic stress disorder, chronic: Secondary | ICD-10-CM | POA: Diagnosis not present

## 2019-05-29 DIAGNOSIS — F332 Major depressive disorder, recurrent severe without psychotic features: Secondary | ICD-10-CM | POA: Diagnosis not present

## 2019-06-05 DIAGNOSIS — F4312 Post-traumatic stress disorder, chronic: Secondary | ICD-10-CM | POA: Diagnosis not present

## 2019-06-05 DIAGNOSIS — F332 Major depressive disorder, recurrent severe without psychotic features: Secondary | ICD-10-CM | POA: Diagnosis not present

## 2019-06-12 DIAGNOSIS — F4312 Post-traumatic stress disorder, chronic: Secondary | ICD-10-CM | POA: Diagnosis not present

## 2019-06-12 DIAGNOSIS — F332 Major depressive disorder, recurrent severe without psychotic features: Secondary | ICD-10-CM | POA: Diagnosis not present

## 2019-06-18 DIAGNOSIS — H00024 Hordeolum internum left upper eyelid: Secondary | ICD-10-CM | POA: Diagnosis not present

## 2019-06-18 DIAGNOSIS — R030 Elevated blood-pressure reading, without diagnosis of hypertension: Secondary | ICD-10-CM | POA: Diagnosis not present

## 2019-06-19 DIAGNOSIS — F332 Major depressive disorder, recurrent severe without psychotic features: Secondary | ICD-10-CM | POA: Diagnosis not present

## 2019-06-19 DIAGNOSIS — F4312 Post-traumatic stress disorder, chronic: Secondary | ICD-10-CM | POA: Diagnosis not present

## 2019-06-26 DIAGNOSIS — I10 Essential (primary) hypertension: Secondary | ICD-10-CM | POA: Diagnosis not present

## 2019-06-26 DIAGNOSIS — F39 Unspecified mood [affective] disorder: Secondary | ICD-10-CM | POA: Diagnosis not present

## 2019-06-26 DIAGNOSIS — J45990 Exercise induced bronchospasm: Secondary | ICD-10-CM | POA: Diagnosis not present

## 2019-06-28 DIAGNOSIS — E119 Type 2 diabetes mellitus without complications: Secondary | ICD-10-CM | POA: Diagnosis not present

## 2019-07-03 DIAGNOSIS — F332 Major depressive disorder, recurrent severe without psychotic features: Secondary | ICD-10-CM | POA: Diagnosis not present

## 2019-07-03 DIAGNOSIS — F4312 Post-traumatic stress disorder, chronic: Secondary | ICD-10-CM | POA: Diagnosis not present

## 2019-07-05 DIAGNOSIS — G4700 Insomnia, unspecified: Secondary | ICD-10-CM | POA: Diagnosis not present

## 2019-07-05 DIAGNOSIS — E119 Type 2 diabetes mellitus without complications: Secondary | ICD-10-CM | POA: Diagnosis not present

## 2019-07-05 DIAGNOSIS — I10 Essential (primary) hypertension: Secondary | ICD-10-CM | POA: Diagnosis not present

## 2019-07-10 DIAGNOSIS — F4312 Post-traumatic stress disorder, chronic: Secondary | ICD-10-CM | POA: Diagnosis not present

## 2019-07-10 DIAGNOSIS — F332 Major depressive disorder, recurrent severe without psychotic features: Secondary | ICD-10-CM | POA: Diagnosis not present

## 2019-07-24 DIAGNOSIS — F332 Major depressive disorder, recurrent severe without psychotic features: Secondary | ICD-10-CM | POA: Diagnosis not present

## 2019-07-24 DIAGNOSIS — F4312 Post-traumatic stress disorder, chronic: Secondary | ICD-10-CM | POA: Diagnosis not present

## 2019-07-31 DIAGNOSIS — F332 Major depressive disorder, recurrent severe without psychotic features: Secondary | ICD-10-CM | POA: Diagnosis not present

## 2019-07-31 DIAGNOSIS — F4312 Post-traumatic stress disorder, chronic: Secondary | ICD-10-CM | POA: Diagnosis not present

## 2019-08-01 ENCOUNTER — Encounter: Payer: BC Managed Care – PPO | Admitting: Nurse Practitioner

## 2019-08-02 DIAGNOSIS — E119 Type 2 diabetes mellitus without complications: Secondary | ICD-10-CM | POA: Diagnosis not present

## 2019-08-02 DIAGNOSIS — I10 Essential (primary) hypertension: Secondary | ICD-10-CM | POA: Diagnosis not present

## 2019-08-07 DIAGNOSIS — F4312 Post-traumatic stress disorder, chronic: Secondary | ICD-10-CM | POA: Diagnosis not present

## 2019-08-07 DIAGNOSIS — F332 Major depressive disorder, recurrent severe without psychotic features: Secondary | ICD-10-CM | POA: Diagnosis not present

## 2019-08-14 DIAGNOSIS — F4312 Post-traumatic stress disorder, chronic: Secondary | ICD-10-CM | POA: Diagnosis not present

## 2019-08-14 DIAGNOSIS — F332 Major depressive disorder, recurrent severe without psychotic features: Secondary | ICD-10-CM | POA: Diagnosis not present

## 2019-08-22 DIAGNOSIS — F332 Major depressive disorder, recurrent severe without psychotic features: Secondary | ICD-10-CM | POA: Diagnosis not present

## 2019-08-22 DIAGNOSIS — F4312 Post-traumatic stress disorder, chronic: Secondary | ICD-10-CM | POA: Diagnosis not present

## 2019-08-28 DIAGNOSIS — F332 Major depressive disorder, recurrent severe without psychotic features: Secondary | ICD-10-CM | POA: Diagnosis not present

## 2019-08-28 DIAGNOSIS — F4312 Post-traumatic stress disorder, chronic: Secondary | ICD-10-CM | POA: Diagnosis not present

## 2019-09-04 DIAGNOSIS — F4312 Post-traumatic stress disorder, chronic: Secondary | ICD-10-CM | POA: Diagnosis not present

## 2019-09-04 DIAGNOSIS — F332 Major depressive disorder, recurrent severe without psychotic features: Secondary | ICD-10-CM | POA: Diagnosis not present

## 2019-09-12 DIAGNOSIS — F332 Major depressive disorder, recurrent severe without psychotic features: Secondary | ICD-10-CM | POA: Diagnosis not present

## 2019-09-12 DIAGNOSIS — F4312 Post-traumatic stress disorder, chronic: Secondary | ICD-10-CM | POA: Diagnosis not present

## 2019-09-25 DIAGNOSIS — F4312 Post-traumatic stress disorder, chronic: Secondary | ICD-10-CM | POA: Diagnosis not present

## 2019-09-25 DIAGNOSIS — F332 Major depressive disorder, recurrent severe without psychotic features: Secondary | ICD-10-CM | POA: Diagnosis not present

## 2019-10-02 DIAGNOSIS — F332 Major depressive disorder, recurrent severe without psychotic features: Secondary | ICD-10-CM | POA: Diagnosis not present

## 2019-10-02 DIAGNOSIS — F4312 Post-traumatic stress disorder, chronic: Secondary | ICD-10-CM | POA: Diagnosis not present

## 2019-10-09 DIAGNOSIS — F332 Major depressive disorder, recurrent severe without psychotic features: Secondary | ICD-10-CM | POA: Diagnosis not present

## 2019-10-09 DIAGNOSIS — F4312 Post-traumatic stress disorder, chronic: Secondary | ICD-10-CM | POA: Diagnosis not present

## 2019-10-16 DIAGNOSIS — F4312 Post-traumatic stress disorder, chronic: Secondary | ICD-10-CM | POA: Diagnosis not present

## 2019-10-16 DIAGNOSIS — F332 Major depressive disorder, recurrent severe without psychotic features: Secondary | ICD-10-CM | POA: Diagnosis not present

## 2019-10-23 DIAGNOSIS — F4312 Post-traumatic stress disorder, chronic: Secondary | ICD-10-CM | POA: Diagnosis not present

## 2019-10-23 DIAGNOSIS — F332 Major depressive disorder, recurrent severe without psychotic features: Secondary | ICD-10-CM | POA: Diagnosis not present

## 2019-10-30 DIAGNOSIS — F4312 Post-traumatic stress disorder, chronic: Secondary | ICD-10-CM | POA: Diagnosis not present

## 2019-10-30 DIAGNOSIS — F332 Major depressive disorder, recurrent severe without psychotic features: Secondary | ICD-10-CM | POA: Diagnosis not present

## 2019-11-05 DIAGNOSIS — F4312 Post-traumatic stress disorder, chronic: Secondary | ICD-10-CM | POA: Diagnosis not present

## 2019-11-05 DIAGNOSIS — F332 Major depressive disorder, recurrent severe without psychotic features: Secondary | ICD-10-CM | POA: Diagnosis not present

## 2019-11-13 DIAGNOSIS — F332 Major depressive disorder, recurrent severe without psychotic features: Secondary | ICD-10-CM | POA: Diagnosis not present

## 2019-11-13 DIAGNOSIS — F4312 Post-traumatic stress disorder, chronic: Secondary | ICD-10-CM | POA: Diagnosis not present

## 2019-11-15 DIAGNOSIS — I10 Essential (primary) hypertension: Secondary | ICD-10-CM | POA: Diagnosis not present

## 2019-11-15 DIAGNOSIS — E119 Type 2 diabetes mellitus without complications: Secondary | ICD-10-CM | POA: Diagnosis not present

## 2019-11-20 DIAGNOSIS — F332 Major depressive disorder, recurrent severe without psychotic features: Secondary | ICD-10-CM | POA: Diagnosis not present

## 2019-11-20 DIAGNOSIS — F4312 Post-traumatic stress disorder, chronic: Secondary | ICD-10-CM | POA: Diagnosis not present

## 2019-11-27 DIAGNOSIS — F332 Major depressive disorder, recurrent severe without psychotic features: Secondary | ICD-10-CM | POA: Diagnosis not present

## 2019-11-27 DIAGNOSIS — F4312 Post-traumatic stress disorder, chronic: Secondary | ICD-10-CM | POA: Diagnosis not present

## 2019-12-05 DIAGNOSIS — E119 Type 2 diabetes mellitus without complications: Secondary | ICD-10-CM | POA: Diagnosis not present

## 2019-12-05 DIAGNOSIS — I10 Essential (primary) hypertension: Secondary | ICD-10-CM | POA: Diagnosis not present

## 2019-12-18 DIAGNOSIS — F332 Major depressive disorder, recurrent severe without psychotic features: Secondary | ICD-10-CM | POA: Diagnosis not present

## 2019-12-18 DIAGNOSIS — F4312 Post-traumatic stress disorder, chronic: Secondary | ICD-10-CM | POA: Diagnosis not present

## 2019-12-19 DIAGNOSIS — F39 Unspecified mood [affective] disorder: Secondary | ICD-10-CM | POA: Diagnosis not present

## 2019-12-19 DIAGNOSIS — E119 Type 2 diabetes mellitus without complications: Secondary | ICD-10-CM | POA: Diagnosis not present

## 2019-12-19 DIAGNOSIS — I10 Essential (primary) hypertension: Secondary | ICD-10-CM | POA: Diagnosis not present

## 2019-12-25 DIAGNOSIS — F4312 Post-traumatic stress disorder, chronic: Secondary | ICD-10-CM | POA: Diagnosis not present

## 2019-12-25 DIAGNOSIS — F332 Major depressive disorder, recurrent severe without psychotic features: Secondary | ICD-10-CM | POA: Diagnosis not present

## 2019-12-27 DIAGNOSIS — S93491A Sprain of other ligament of right ankle, initial encounter: Secondary | ICD-10-CM | POA: Diagnosis not present

## 2020-01-15 DIAGNOSIS — F4312 Post-traumatic stress disorder, chronic: Secondary | ICD-10-CM | POA: Diagnosis not present

## 2020-01-15 DIAGNOSIS — F332 Major depressive disorder, recurrent severe without psychotic features: Secondary | ICD-10-CM | POA: Diagnosis not present

## 2020-01-29 DIAGNOSIS — F4312 Post-traumatic stress disorder, chronic: Secondary | ICD-10-CM | POA: Diagnosis not present

## 2020-01-29 DIAGNOSIS — F332 Major depressive disorder, recurrent severe without psychotic features: Secondary | ICD-10-CM | POA: Diagnosis not present

## 2020-02-05 DIAGNOSIS — F332 Major depressive disorder, recurrent severe without psychotic features: Secondary | ICD-10-CM | POA: Diagnosis not present

## 2020-02-05 DIAGNOSIS — F4312 Post-traumatic stress disorder, chronic: Secondary | ICD-10-CM | POA: Diagnosis not present

## 2020-02-12 DIAGNOSIS — F332 Major depressive disorder, recurrent severe without psychotic features: Secondary | ICD-10-CM | POA: Diagnosis not present

## 2020-02-12 DIAGNOSIS — F4312 Post-traumatic stress disorder, chronic: Secondary | ICD-10-CM | POA: Diagnosis not present

## 2020-02-19 DIAGNOSIS — F332 Major depressive disorder, recurrent severe without psychotic features: Secondary | ICD-10-CM | POA: Diagnosis not present

## 2020-02-19 DIAGNOSIS — F4312 Post-traumatic stress disorder, chronic: Secondary | ICD-10-CM | POA: Diagnosis not present

## 2020-02-26 DIAGNOSIS — F4312 Post-traumatic stress disorder, chronic: Secondary | ICD-10-CM | POA: Diagnosis not present

## 2020-02-26 DIAGNOSIS — F332 Major depressive disorder, recurrent severe without psychotic features: Secondary | ICD-10-CM | POA: Diagnosis not present

## 2020-03-03 DIAGNOSIS — E119 Type 2 diabetes mellitus without complications: Secondary | ICD-10-CM | POA: Diagnosis not present

## 2020-03-03 DIAGNOSIS — Z6841 Body Mass Index (BMI) 40.0 and over, adult: Secondary | ICD-10-CM | POA: Diagnosis not present

## 2020-03-03 DIAGNOSIS — I10 Essential (primary) hypertension: Secondary | ICD-10-CM | POA: Diagnosis not present

## 2020-03-03 DIAGNOSIS — Z01411 Encounter for gynecological examination (general) (routine) with abnormal findings: Secondary | ICD-10-CM | POA: Diagnosis not present

## 2020-03-04 DIAGNOSIS — F332 Major depressive disorder, recurrent severe without psychotic features: Secondary | ICD-10-CM | POA: Diagnosis not present

## 2020-03-04 DIAGNOSIS — F4312 Post-traumatic stress disorder, chronic: Secondary | ICD-10-CM | POA: Diagnosis not present

## 2020-03-11 DIAGNOSIS — F4312 Post-traumatic stress disorder, chronic: Secondary | ICD-10-CM | POA: Diagnosis not present

## 2020-03-11 DIAGNOSIS — N390 Urinary tract infection, site not specified: Secondary | ICD-10-CM | POA: Diagnosis not present

## 2020-03-11 DIAGNOSIS — F332 Major depressive disorder, recurrent severe without psychotic features: Secondary | ICD-10-CM | POA: Diagnosis not present

## 2020-03-11 DIAGNOSIS — E119 Type 2 diabetes mellitus without complications: Secondary | ICD-10-CM | POA: Diagnosis not present

## 2020-03-11 DIAGNOSIS — R946 Abnormal results of thyroid function studies: Secondary | ICD-10-CM | POA: Diagnosis not present

## 2020-03-11 DIAGNOSIS — I10 Essential (primary) hypertension: Secondary | ICD-10-CM | POA: Diagnosis not present

## 2020-03-18 DIAGNOSIS — E119 Type 2 diabetes mellitus without complications: Secondary | ICD-10-CM | POA: Diagnosis not present

## 2020-03-18 DIAGNOSIS — F4312 Post-traumatic stress disorder, chronic: Secondary | ICD-10-CM | POA: Diagnosis not present

## 2020-03-18 DIAGNOSIS — G4700 Insomnia, unspecified: Secondary | ICD-10-CM | POA: Diagnosis not present

## 2020-03-18 DIAGNOSIS — F39 Unspecified mood [affective] disorder: Secondary | ICD-10-CM | POA: Diagnosis not present

## 2020-03-18 DIAGNOSIS — Z0000 Encounter for general adult medical examination without abnormal findings: Secondary | ICD-10-CM | POA: Diagnosis not present

## 2020-03-18 DIAGNOSIS — R45851 Suicidal ideations: Secondary | ICD-10-CM | POA: Diagnosis not present

## 2020-03-18 DIAGNOSIS — I10 Essential (primary) hypertension: Secondary | ICD-10-CM | POA: Diagnosis not present

## 2020-03-18 DIAGNOSIS — F332 Major depressive disorder, recurrent severe without psychotic features: Secondary | ICD-10-CM | POA: Diagnosis not present

## 2020-03-19 DIAGNOSIS — F4312 Post-traumatic stress disorder, chronic: Secondary | ICD-10-CM | POA: Diagnosis not present

## 2020-03-19 DIAGNOSIS — F332 Major depressive disorder, recurrent severe without psychotic features: Secondary | ICD-10-CM | POA: Diagnosis not present

## 2020-03-24 DIAGNOSIS — F39 Unspecified mood [affective] disorder: Secondary | ICD-10-CM | POA: Diagnosis not present

## 2020-03-24 DIAGNOSIS — G4700 Insomnia, unspecified: Secondary | ICD-10-CM | POA: Diagnosis not present

## 2020-03-24 DIAGNOSIS — E119 Type 2 diabetes mellitus without complications: Secondary | ICD-10-CM | POA: Diagnosis not present

## 2020-03-25 DIAGNOSIS — I10 Essential (primary) hypertension: Secondary | ICD-10-CM | POA: Diagnosis not present

## 2020-03-25 DIAGNOSIS — G4700 Insomnia, unspecified: Secondary | ICD-10-CM | POA: Diagnosis not present

## 2020-03-25 DIAGNOSIS — F4312 Post-traumatic stress disorder, chronic: Secondary | ICD-10-CM | POA: Diagnosis not present

## 2020-03-25 DIAGNOSIS — F332 Major depressive disorder, recurrent severe without psychotic features: Secondary | ICD-10-CM | POA: Diagnosis not present

## 2020-03-25 DIAGNOSIS — F4522 Body dysmorphic disorder: Secondary | ICD-10-CM | POA: Diagnosis not present

## 2020-03-25 DIAGNOSIS — E119 Type 2 diabetes mellitus without complications: Secondary | ICD-10-CM | POA: Diagnosis not present

## 2020-03-31 DIAGNOSIS — F39 Unspecified mood [affective] disorder: Secondary | ICD-10-CM | POA: Diagnosis not present

## 2020-03-31 DIAGNOSIS — G4700 Insomnia, unspecified: Secondary | ICD-10-CM | POA: Diagnosis not present

## 2020-04-01 DIAGNOSIS — F332 Major depressive disorder, recurrent severe without psychotic features: Secondary | ICD-10-CM | POA: Diagnosis not present

## 2020-04-01 DIAGNOSIS — F4312 Post-traumatic stress disorder, chronic: Secondary | ICD-10-CM | POA: Diagnosis not present

## 2020-04-14 DIAGNOSIS — F411 Generalized anxiety disorder: Secondary | ICD-10-CM | POA: Diagnosis not present

## 2020-04-14 DIAGNOSIS — I10 Essential (primary) hypertension: Secondary | ICD-10-CM | POA: Diagnosis not present

## 2020-04-14 DIAGNOSIS — G4700 Insomnia, unspecified: Secondary | ICD-10-CM | POA: Diagnosis not present

## 2020-04-15 DIAGNOSIS — F332 Major depressive disorder, recurrent severe without psychotic features: Secondary | ICD-10-CM | POA: Diagnosis not present

## 2020-04-15 DIAGNOSIS — F4312 Post-traumatic stress disorder, chronic: Secondary | ICD-10-CM | POA: Diagnosis not present

## 2020-04-22 DIAGNOSIS — F332 Major depressive disorder, recurrent severe without psychotic features: Secondary | ICD-10-CM | POA: Diagnosis not present

## 2020-04-22 DIAGNOSIS — F4312 Post-traumatic stress disorder, chronic: Secondary | ICD-10-CM | POA: Diagnosis not present

## 2020-04-29 DIAGNOSIS — E119 Type 2 diabetes mellitus without complications: Secondary | ICD-10-CM | POA: Diagnosis not present

## 2020-04-29 DIAGNOSIS — F332 Major depressive disorder, recurrent severe without psychotic features: Secondary | ICD-10-CM | POA: Diagnosis not present

## 2020-04-29 DIAGNOSIS — G4700 Insomnia, unspecified: Secondary | ICD-10-CM | POA: Diagnosis not present

## 2020-04-29 DIAGNOSIS — F4312 Post-traumatic stress disorder, chronic: Secondary | ICD-10-CM | POA: Diagnosis not present

## 2020-04-29 DIAGNOSIS — R202 Paresthesia of skin: Secondary | ICD-10-CM | POA: Diagnosis not present

## 2020-04-29 DIAGNOSIS — F411 Generalized anxiety disorder: Secondary | ICD-10-CM | POA: Diagnosis not present

## 2020-04-29 DIAGNOSIS — I10 Essential (primary) hypertension: Secondary | ICD-10-CM | POA: Diagnosis not present

## 2020-04-29 DIAGNOSIS — R200 Anesthesia of skin: Secondary | ICD-10-CM | POA: Diagnosis not present

## 2020-05-06 DIAGNOSIS — F4312 Post-traumatic stress disorder, chronic: Secondary | ICD-10-CM | POA: Diagnosis not present

## 2020-05-06 DIAGNOSIS — F332 Major depressive disorder, recurrent severe without psychotic features: Secondary | ICD-10-CM | POA: Diagnosis not present

## 2020-05-13 DIAGNOSIS — F332 Major depressive disorder, recurrent severe without psychotic features: Secondary | ICD-10-CM | POA: Diagnosis not present

## 2020-05-13 DIAGNOSIS — F4312 Post-traumatic stress disorder, chronic: Secondary | ICD-10-CM | POA: Diagnosis not present

## 2020-05-20 DIAGNOSIS — F332 Major depressive disorder, recurrent severe without psychotic features: Secondary | ICD-10-CM | POA: Diagnosis not present

## 2020-05-20 DIAGNOSIS — F4312 Post-traumatic stress disorder, chronic: Secondary | ICD-10-CM | POA: Diagnosis not present

## 2020-05-27 DIAGNOSIS — F4312 Post-traumatic stress disorder, chronic: Secondary | ICD-10-CM | POA: Diagnosis not present

## 2020-05-27 DIAGNOSIS — F332 Major depressive disorder, recurrent severe without psychotic features: Secondary | ICD-10-CM | POA: Diagnosis not present

## 2020-06-03 DIAGNOSIS — F332 Major depressive disorder, recurrent severe without psychotic features: Secondary | ICD-10-CM | POA: Diagnosis not present

## 2020-06-03 DIAGNOSIS — F4312 Post-traumatic stress disorder, chronic: Secondary | ICD-10-CM | POA: Diagnosis not present

## 2020-06-17 DIAGNOSIS — F332 Major depressive disorder, recurrent severe without psychotic features: Secondary | ICD-10-CM | POA: Diagnosis not present

## 2020-06-17 DIAGNOSIS — F4312 Post-traumatic stress disorder, chronic: Secondary | ICD-10-CM | POA: Diagnosis not present

## 2020-06-17 DIAGNOSIS — Z634 Disappearance and death of family member: Secondary | ICD-10-CM | POA: Diagnosis not present

## 2020-07-01 DIAGNOSIS — F332 Major depressive disorder, recurrent severe without psychotic features: Secondary | ICD-10-CM | POA: Diagnosis not present

## 2020-07-01 DIAGNOSIS — F4312 Post-traumatic stress disorder, chronic: Secondary | ICD-10-CM | POA: Diagnosis not present

## 2020-07-08 DIAGNOSIS — F332 Major depressive disorder, recurrent severe without psychotic features: Secondary | ICD-10-CM | POA: Diagnosis not present

## 2020-07-08 DIAGNOSIS — F4312 Post-traumatic stress disorder, chronic: Secondary | ICD-10-CM | POA: Diagnosis not present

## 2020-07-11 DIAGNOSIS — R0781 Pleurodynia: Secondary | ICD-10-CM | POA: Diagnosis not present

## 2020-07-22 DIAGNOSIS — F332 Major depressive disorder, recurrent severe without psychotic features: Secondary | ICD-10-CM | POA: Diagnosis not present

## 2020-07-22 DIAGNOSIS — F4312 Post-traumatic stress disorder, chronic: Secondary | ICD-10-CM | POA: Diagnosis not present

## 2020-07-29 DIAGNOSIS — F4312 Post-traumatic stress disorder, chronic: Secondary | ICD-10-CM | POA: Diagnosis not present

## 2020-07-29 DIAGNOSIS — F332 Major depressive disorder, recurrent severe without psychotic features: Secondary | ICD-10-CM | POA: Diagnosis not present

## 2020-08-05 DIAGNOSIS — F4312 Post-traumatic stress disorder, chronic: Secondary | ICD-10-CM | POA: Diagnosis not present

## 2020-08-05 DIAGNOSIS — F332 Major depressive disorder, recurrent severe without psychotic features: Secondary | ICD-10-CM | POA: Diagnosis not present

## 2020-08-12 DIAGNOSIS — F4312 Post-traumatic stress disorder, chronic: Secondary | ICD-10-CM | POA: Diagnosis not present

## 2020-08-12 DIAGNOSIS — F332 Major depressive disorder, recurrent severe without psychotic features: Secondary | ICD-10-CM | POA: Diagnosis not present

## 2020-08-19 DIAGNOSIS — F4312 Post-traumatic stress disorder, chronic: Secondary | ICD-10-CM | POA: Diagnosis not present

## 2020-08-19 DIAGNOSIS — F332 Major depressive disorder, recurrent severe without psychotic features: Secondary | ICD-10-CM | POA: Diagnosis not present

## 2020-08-26 DIAGNOSIS — F4312 Post-traumatic stress disorder, chronic: Secondary | ICD-10-CM | POA: Diagnosis not present

## 2020-08-26 DIAGNOSIS — F332 Major depressive disorder, recurrent severe without psychotic features: Secondary | ICD-10-CM | POA: Diagnosis not present

## 2020-09-02 DIAGNOSIS — F332 Major depressive disorder, recurrent severe without psychotic features: Secondary | ICD-10-CM | POA: Diagnosis not present

## 2020-09-02 DIAGNOSIS — F4312 Post-traumatic stress disorder, chronic: Secondary | ICD-10-CM | POA: Diagnosis not present

## 2020-09-23 DIAGNOSIS — F332 Major depressive disorder, recurrent severe without psychotic features: Secondary | ICD-10-CM | POA: Diagnosis not present

## 2020-09-23 DIAGNOSIS — F4312 Post-traumatic stress disorder, chronic: Secondary | ICD-10-CM | POA: Diagnosis not present

## 2020-09-30 DIAGNOSIS — G4700 Insomnia, unspecified: Secondary | ICD-10-CM | POA: Diagnosis not present

## 2020-09-30 DIAGNOSIS — I10 Essential (primary) hypertension: Secondary | ICD-10-CM | POA: Diagnosis not present

## 2020-09-30 DIAGNOSIS — F4312 Post-traumatic stress disorder, chronic: Secondary | ICD-10-CM | POA: Diagnosis not present

## 2020-09-30 DIAGNOSIS — F411 Generalized anxiety disorder: Secondary | ICD-10-CM | POA: Diagnosis not present

## 2020-09-30 DIAGNOSIS — E1165 Type 2 diabetes mellitus with hyperglycemia: Secondary | ICD-10-CM | POA: Diagnosis not present

## 2020-09-30 DIAGNOSIS — F332 Major depressive disorder, recurrent severe without psychotic features: Secondary | ICD-10-CM | POA: Diagnosis not present

## 2020-10-07 DIAGNOSIS — F4312 Post-traumatic stress disorder, chronic: Secondary | ICD-10-CM | POA: Diagnosis not present

## 2020-10-07 DIAGNOSIS — F332 Major depressive disorder, recurrent severe without psychotic features: Secondary | ICD-10-CM | POA: Diagnosis not present

## 2020-11-30 ENCOUNTER — Ambulatory Visit (HOSPITAL_COMMUNITY)
Admission: RE | Admit: 2020-11-30 | Discharge: 2020-11-30 | Disposition: A | Discharge status: Home / Self Care | Payer: BC Managed Care – PPO | Attending: Psychiatry | Admitting: Psychiatry

## 2020-11-30 DIAGNOSIS — G4700 Insomnia, unspecified: Secondary | ICD-10-CM | POA: Insufficient documentation

## 2020-11-30 DIAGNOSIS — F5089 Other specified eating disorder: Secondary | ICD-10-CM | POA: Insufficient documentation

## 2020-11-30 DIAGNOSIS — F32A Depression, unspecified: Secondary | ICD-10-CM | POA: Insufficient documentation

## 2020-11-30 DIAGNOSIS — Z569 Unspecified problems related to employment: Secondary | ICD-10-CM | POA: Insufficient documentation

## 2020-11-30 DIAGNOSIS — F1091 Alcohol use, unspecified, in remission: Secondary | ICD-10-CM | POA: Diagnosis not present

## 2020-11-30 DIAGNOSIS — F419 Anxiety disorder, unspecified: Secondary | ICD-10-CM | POA: Diagnosis not present

## 2020-11-30 NOTE — BH Assessment (Signed)
Stacy Wang is a 25 y.o. female with a hx of MDD and Bell's Palsy who presents this date with her sister requesting a psychiatric evaluation for ongoing issues in reference to acquiring her disability from current employer. Patient denies any S/I, H/I or AVH. Patient states she is currently employed by [employer] although has taken a leave of absence from that position due to her depression. Patient denies having a current OP provider that assists with medication management although patient has been seeing a therapist Baxter Kail [phone] for the last two years weekly to assist with depression/anxiety. Patient reports that "trying to get her disability due to getting the run around" from her employer have intensified her symptoms over the last two weeks to include sleeping only 2 to 3 hours a night and "worrying about everything." Patient denies any current SA issues. Per chart review patient was seen in 2019 when she presented to [facility] with similar symptoms although had S/I at that time. Patient states she has not been on any medications since then and felt her symptoms were managed effectively until now without medication/s. Patient states she would like OP resources to be evaluated for her disability and possibly medication management for symptom management. See provider note this date. Patient per Milinda Antis NP states patient can be discharged with resources provided.

## 2020-11-30 NOTE — H&P (Signed)
Behavioral Health Medical Screening Exam  Stacy Wang is an 25 y.o. female who presents voluntarily to [facility] with her sister for assessment of increased depression and situational stress related to her job.   Patient reports history of MDD "issues" and couldn't get an appointment with her PCP during what she feels is a mental health crisis. Patient states she has been out of work since 10/17/20 due to mental health issues where she was ingesting increased ETOH and having suicidal ideations. She says her job has been "harassing" her about returning despite having submitted paperwork in which she recalls was approved to be out until December. As a result she experience increased anxiety, insomnia, and poor appetite.   She endorses active sobriety and feels she is "spiraling" where the suicidality has "subsided" and anxiety increased. She is current receiving outpatient therapy 3x/week Baxter Kail); no outpatient medication management. She denies any suicidal or homicidal ideations, auditory or visual hallucinations. She contracts for safety; sister Loistine Chance feels patient is safe and plan to follow-up tomorrow.   Total Time spent with patient: 20 minutes  Psychiatric Specialty Exam: Physical Exam Vitals and nursing note reviewed.  Constitutional:      Appearance: She is obese. She is ill-appearing.  HENT:     Head: Normocephalic.     Mouth/Throat:     Mouth: Mucous membranes are moist.     Pharynx: Oropharynx is clear.  Eyes:     Comments: Right lid droop 2/2 Bells Palsy  Neurological:     Mental Status: She is alert.  Psychiatric:        Attention and Perception: Attention and perception normal.        Mood and Affect: Affect normal. Mood is depressed.        Speech: Speech normal.        Behavior: Behavior normal. Behavior is cooperative.        Thought Content: Thought content is not paranoid or delusional. Thought content does not include homicidal or suicidal ideation. Thought  content does not include homicidal or suicidal plan.        Cognition and Memory: Cognition and memory normal.        Judgment: Judgment normal.   Review of Systems  Psychiatric/Behavioral:  Positive for dysphoric mood and sleep disturbance. Negative for hallucinations, self-injury and suicidal ideas.   All other systems reviewed and are negative. Blood pressure (!) 142/94, pulse 91, temperature 98.8 F (37.1 C), temperature source Oral, resp. rate 18, SpO2 100 %.There is no height or weight on file to calculate BMI. General Appearance: Casual Eye Contact:  Good Speech:  Clear and Coherent Volume:  Normal Mood:  Dysphoric Affect:  Congruent Thought Process:  Coherent and Goal Directed Orientation:  Full (Time, Place, and Person) Thought Content:  Logical Suicidal Thoughts:  No Homicidal Thoughts:  No Memory:  Immediate;   Fair Recent;   Fair Judgement:  Fair Insight:  Present Psychomotor Activity:  Normal Concentration: Concentration: Fair and Attention Span: Fair Recall:  [employer] of Knowledge:Fair Language: Fair Akathisia:  NA Handed:   AIMS (if indicated):    Assets:  Communication Skills Desire for Improvement Financial Resources/Insurance Housing Leisure Time Physical Health Resilience Social Support Transportation Vocational/Educational Sleep:     Musculoskeletal: Strength & Muscle Tone: within normal limits Gait & Station: normal Patient leans: N/A  Blood pressure (!) 142/94, pulse 91, temperature 98.8 F (37.1 C), temperature source Oral, resp. rate 18, SpO2 100 %.  Recommendations: Based on my evaluation the  patient does not appear to have an emergency medical condition. Patient given outpatient medication management and psych assessment services  Loletta Parish, NP 11/30/2020, 10:53 AM

## 2021-02-06 ENCOUNTER — Ambulatory Visit: Payer: BC Managed Care – PPO

## 2022-03-31 ENCOUNTER — Other Ambulatory Visit: Payer: Self-pay

## 2022-03-31 ENCOUNTER — Inpatient Hospital Stay (HOSPITAL_BASED_OUTPATIENT_CLINIC_OR_DEPARTMENT_OTHER)
Admission: EM | Admit: 2022-03-31 | Discharge: 2022-04-02 | DRG: 638 | Disposition: A | Discharge status: Home / Self Care | Payer: Commercial Managed Care - PPO | Attending: Internal Medicine | Admitting: Internal Medicine

## 2022-03-31 ENCOUNTER — Encounter (HOSPITAL_BASED_OUTPATIENT_CLINIC_OR_DEPARTMENT_OTHER): Payer: Self-pay

## 2022-03-31 DIAGNOSIS — Z79899 Other long term (current) drug therapy: Secondary | ICD-10-CM

## 2022-03-31 DIAGNOSIS — Z793 Long term (current) use of hormonal contraceptives: Secondary | ICD-10-CM

## 2022-03-31 DIAGNOSIS — R739 Hyperglycemia, unspecified: Principal | ICD-10-CM

## 2022-03-31 DIAGNOSIS — Z7984 Long term (current) use of oral hypoglycemic drugs: Secondary | ICD-10-CM

## 2022-03-31 DIAGNOSIS — T383X6A Underdosing of insulin and oral hypoglycemic [antidiabetic] drugs, initial encounter: Secondary | ICD-10-CM | POA: Diagnosis present

## 2022-03-31 DIAGNOSIS — Z8249 Family history of ischemic heart disease and other diseases of the circulatory system: Secondary | ICD-10-CM

## 2022-03-31 DIAGNOSIS — Z833 Family history of diabetes mellitus: Secondary | ICD-10-CM

## 2022-03-31 DIAGNOSIS — I10 Essential (primary) hypertension: Secondary | ICD-10-CM | POA: Diagnosis not present

## 2022-03-31 DIAGNOSIS — E1110 Type 2 diabetes mellitus with ketoacidosis without coma: Principal | ICD-10-CM | POA: Diagnosis present

## 2022-03-31 DIAGNOSIS — E785 Hyperlipidemia, unspecified: Secondary | ICD-10-CM | POA: Diagnosis present

## 2022-03-31 DIAGNOSIS — Y92009 Unspecified place in unspecified non-institutional (private) residence as the place of occurrence of the external cause: Secondary | ICD-10-CM

## 2022-03-31 DIAGNOSIS — Z888 Allergy status to other drugs, medicaments and biological substances status: Secondary | ICD-10-CM

## 2022-03-31 DIAGNOSIS — Z91148 Patient's other noncompliance with medication regimen for other reason: Secondary | ICD-10-CM

## 2022-03-31 DIAGNOSIS — Z87891 Personal history of nicotine dependence: Secondary | ICD-10-CM

## 2022-03-31 DIAGNOSIS — T50996A Underdosing of other drugs, medicaments and biological substances, initial encounter: Secondary | ICD-10-CM | POA: Diagnosis present

## 2022-03-31 DIAGNOSIS — Z794 Long term (current) use of insulin: Secondary | ICD-10-CM

## 2022-03-31 DIAGNOSIS — E876 Hypokalemia: Secondary | ICD-10-CM | POA: Diagnosis present

## 2022-03-31 DIAGNOSIS — Z91138 Patient's unintentional underdosing of medication regimen for other reason: Secondary | ICD-10-CM

## 2022-03-31 DIAGNOSIS — Z6841 Body Mass Index (BMI) 40.0 and over, adult: Secondary | ICD-10-CM

## 2022-03-31 DIAGNOSIS — Z818 Family history of other mental and behavioral disorders: Secondary | ICD-10-CM

## 2022-03-31 LAB — BASIC METABOLIC PANEL
Anion gap: 20 — ABNORMAL HIGH (ref 5–15)
Anion gap: 20 — ABNORMAL HIGH (ref 5–15)
Anion gap: 20 — ABNORMAL HIGH (ref 5–15)
Anion gap: 17 — ABNORMAL HIGH (ref 5–15)
BUN: 5 mg/dL — ABNORMAL LOW (ref 6–20)
BUN: <5 mg/dL — ABNORMAL LOW (ref 6–20)
BUN: 5 mg/dL — ABNORMAL LOW (ref 6–20)
BUN: <5 mg/dL — ABNORMAL LOW (ref 6–20)
CO2: 17 mmol/L — ABNORMAL LOW (ref 22–32)
CO2: 13 mmol/L — ABNORMAL LOW (ref 22–32)
CO2: 17 mmol/L — ABNORMAL LOW (ref 22–32)
CO2: 19 mmol/L — ABNORMAL LOW (ref 22–32)
Calcium: 9.6 mg/dL (ref 8.9–10.3)
Calcium: 8.8 mg/dL — ABNORMAL LOW (ref 8.9–10.3)
Calcium: 9.7 mg/dL (ref 8.9–10.3)
Calcium: 9 mg/dL (ref 8.9–10.3)
Chloride: 95 mmol/L — ABNORMAL LOW (ref 98–111)
Chloride: 100 mmol/L (ref 98–111)
Chloride: 95 mmol/L — ABNORMAL LOW (ref 98–111)
Chloride: 99 mmol/L (ref 98–111)
Creatinine, Ser: 0.69 mg/dL (ref 0.44–1.00)
Creatinine, Ser: 0.51 mg/dL (ref 0.44–1.00)
Creatinine, Ser: 0.7 mg/dL (ref 0.44–1.00)
Creatinine, Ser: 0.51 mg/dL (ref 0.44–1.00)
GFR, Estimated: >60 mL/min (ref >60)
GFR, Estimated: >60 mL/min (ref >60)
GFR, Estimated: >60 mL/min (ref >60)
GFR, Estimated: >60 mL/min (ref >60)
Glucose, Bld: 407 mg/dL — ABNORMAL HIGH (ref 70–99)
Glucose, Bld: 318 mg/dL — ABNORMAL HIGH (ref 70–99)
Glucose, Bld: 404 mg/dL — ABNORMAL HIGH (ref 70–99)
Glucose, Bld: 165 mg/dL — ABNORMAL HIGH (ref 70–99)
Potassium: 3.6 mmol/L (ref 3.5–5.1)
Potassium: 3.2 mmol/L — ABNORMAL LOW (ref 3.5–5.1)
Potassium: 3.7 mmol/L (ref 3.5–5.1)
Potassium: 3.1 mmol/L — ABNORMAL LOW (ref 3.5–5.1)
Sodium: 132 mmol/L — ABNORMAL LOW (ref 135–145)
Sodium: 133 mmol/L — ABNORMAL LOW (ref 135–145)
Sodium: 132 mmol/L — ABNORMAL LOW (ref 135–145)
Sodium: 135 mmol/L (ref 135–145)

## 2022-03-31 LAB — CBC
HCT: 44 % (ref 36.0–46.0)
Hemoglobin: 15 g/dL (ref 12.0–15.0)
MCH: 27.6 pg (ref 26.0–34.0)
MCHC: 34.1 g/dL (ref 30.0–36.0)
MCV: 80.9 fL (ref 80.0–100.0)
Platelets: 382 10*3/uL (ref 150–400)
RBC: 5.44 MIL/uL — ABNORMAL HIGH (ref 3.87–5.11)
RDW: 13.7 % (ref 11.5–15.5)
WBC: 8.1 10*3/uL (ref 4.0–10.5)
nRBC: 0 % (ref 0.0–0.2)

## 2022-03-31 LAB — CBG MONITORING, ED
Glucose-Capillary: 406 mg/dL — ABNORMAL HIGH (ref 70–99)
Glucose-Capillary: 351 mg/dL — ABNORMAL HIGH (ref 70–99)
Glucose-Capillary: 256 mg/dL — ABNORMAL HIGH (ref 70–99)
Glucose-Capillary: 145 mg/dL — ABNORMAL HIGH (ref 70–99)
Glucose-Capillary: 150 mg/dL — ABNORMAL HIGH (ref 70–99)
Glucose-Capillary: 163 mg/dL — ABNORMAL HIGH (ref 70–99)
Glucose-Capillary: 173 mg/dL — ABNORMAL HIGH (ref 70–99)

## 2022-03-31 LAB — I-STAT VENOUS BLOOD GAS, ED
Acid-base deficit: 9 mmol/L — ABNORMAL HIGH (ref 0.0–2.0)
Bicarbonate: 14.8 mmol/L — ABNORMAL LOW (ref 20.0–28.0)
Calcium, Ion: 1.19 mmol/L (ref 1.15–1.40)
HCT: 38 % (ref 36.0–46.0)
Hemoglobin: 12.9 g/dL (ref 12.0–15.0)
O2 Saturation: 99 %
Potassium: 3.2 mmol/L — ABNORMAL LOW (ref 3.5–5.1)
Sodium: 133 mmol/L — ABNORMAL LOW (ref 135–145)
TCO2: 16 mmol/L — ABNORMAL LOW (ref 22–32)
pCO2, Ven: 25.1 mmHg — ABNORMAL LOW (ref 44–60)
pH, Ven: 7.377 (ref 7.25–7.43)
pO2, Ven: 134 mmHg — ABNORMAL HIGH (ref 32–45)

## 2022-03-31 LAB — URINALYSIS, ROUTINE W REFLEX MICROSCOPIC
Bacteria, UA: NONE SEEN
Bilirubin Urine: NEGATIVE
Glucose, UA: >1000 mg/dL — AB
Hgb urine dipstick: NEGATIVE
Ketones, ur: >80 mg/dL — AB
Nitrite: NEGATIVE
Specific Gravity, Urine: 1.039 — ABNORMAL HIGH (ref 1.005–1.030)
pH: 5.5 (ref 5.0–8.0)

## 2022-03-31 LAB — PREGNANCY, URINE: Preg Test, Ur: NEGATIVE

## 2022-03-31 LAB — BETA-HYDROXYBUTYRIC ACID: Beta-Hydroxybutyric Acid: 7.13 mmol/L — ABNORMAL HIGH (ref 0.05–0.27)

## 2022-03-31 MED ORDER — POTASSIUM CHLORIDE 10 MEQ/100ML IV SOLN
10.0000 meq | INTRAVENOUS | Status: AC
  Administered 2022-03-31 (×2): 10 meq via INTRAVENOUS
  Filled 2022-03-31 (×2): qty 100

## 2022-03-31 MED ORDER — DEXTROSE 50 % IV SOLN: 0.0000 mL | INTRAVENOUS | Status: DC | PRN

## 2022-03-31 MED ORDER — HYDRALAZINE HCL 20 MG/ML IJ SOLN: 5.0000 mg | INTRAMUSCULAR | Status: DC | PRN

## 2022-03-31 MED ORDER — LACTATED RINGERS IV SOLN: INTRAVENOUS | Status: DC

## 2022-03-31 MED ORDER — ONDANSETRON HCL 4 MG/2ML IJ SOLN: 4.0000 mg | Freq: Four times a day (QID) | INTRAMUSCULAR | Status: DC | PRN

## 2022-03-31 MED ORDER — INSULIN REGULAR(HUMAN) IN NACL 100-0.9 UT/100ML-% IV SOLN
INTRAVENOUS | Status: DC
  Administered 2022-03-31: 19 [IU]/h via INTRAVENOUS
  Administered 2022-03-31: 4.8 [IU]/h via INTRAVENOUS
  Administered 2022-04-01: 2 [IU]/h via INTRAVENOUS
  Filled 2022-03-31 (×2): qty 100

## 2022-03-31 MED ORDER — LACTATED RINGERS IV BOLUS
20.0000 mL/kg | Freq: Once | INTRAVENOUS | Status: AC
  Administered 2022-03-31: 2712 mL via INTRAVENOUS

## 2022-03-31 MED ORDER — DEXTROSE IN LACTATED RINGERS 5 % IV SOLN: INTRAVENOUS | Status: DC

## 2022-03-31 MED ORDER — CHLORHEXIDINE GLUCONATE CLOTH 2 % EX PADS
6.0000 | MEDICATED_PAD | Freq: Every day | CUTANEOUS | Status: DC
  Administered 2022-03-31: 6 via TOPICAL
  Filled 2022-03-31: qty 6

## 2022-03-31 NOTE — ED Triage Notes (Signed)
Patient here POV from Home.  Endorses Hyperglycemia since January. Has not been taking Diabetes Medication since October. Has been managing Diabetes with Diet otherwise.  No nausea. Endorses Headache, Polyuria, polydipsia.   NAD Noted during Triage. A&Ox4. Gcs 15. Ambulatory.

## 2022-03-31 NOTE — ED Notes (Signed)
Report given to carelink at this time.  

## 2022-03-31 NOTE — Progress Notes (Signed)
Hospitalist Transfer Note:  Transferring facility: [facility] Requesting provider: Rex Kras (EDP at [facility]) Reason for transfer: admission for further evaluation and management of DKA.    27  year old female with medical history notable for DM2, who presented to [facility] ED complaining of polydipsia, polyuria.  She reportedly knowledges a history of poor compliance with outpatient insulin, noting sporadic use of her home insulin over the last 6 months dating back to October 2023.  Denies any recent subjective fever/chills.  No chest pain.  Was found to have blood sugar greater than 400, with initial BMP notable for bicarbonate 14, anion gap 20, initial potassium 3.6.  Urinalysis notable for the presence of ketones.  VBG notable for pH of 7.37.   Was started on insulin drip with Endo tool, received 3 L of LR, as well as IV potassium chloride supplementation.  Updated BMP shows that anion gap is improving, but has not yet closed.  Will continue existing insulin drip with Endo tool.  Subsequently, I accepted this patient for transfer for observation to a SDU bed at [facility] or [facility] (first available) for further work-up and management of the above .     Check [email or web address] for on-call coverage.   Nursing staff, Please call [place] number on Amion as soon as patient's arrival, so appropriate admitting provider can evaluate the pt.     Babs Bertin, DO Hospitalist

## 2022-03-31 NOTE — H&P (Incomplete)
PCP:   Nena Polio, NP   Chief Complaint:  Hyperglycemia  HPI: This is a 27 year old female with past medical history of diabetes mellitus, hypertension and hyperlipidemia.  Patient has been out of her medications due to insurance issues.  She has no complaints of nausea, vomiting, abdominal pain.  She does endorse polydipsia and polyuria.  Today she checked her FSBS because her brother was being hospitalized for hypoglycemia.  Her reading was > 400.  She went to [facility] urgent [facility].  At urgent care, patient's beta hydroxybutyrate acid was 3.94, CO2 13 and glucose 404.  Admission requested for DKA.  Review of Systems:  The patient denies anorexia, fever, weight loss,, vision loss, decreased hearing, hoarseness, chest pain, syncope, dyspnea on exertion, peripheral edema, balance deficits, hemoptysis, abdominal pain, melena, hematochezia, severe indigestion/heartburn, hematuria, incontinence, genital sores, muscle weakness, suspicious skin lesions, transient blindness, difficulty walking, depression, unusual weight change, abnormal bleeding, enlarged lymph nodes, angioedema, and breast masses. Positives: Hyperglycemia  Past Medical History: Past Medical History:  Diagnosis Date   Bell palsy    Bell's palsy    Diabetes mellitus without complication ([place])    type 2   Hypertension    Migraine    Past Surgical History:  Procedure Laterality Date   WISDOM TOOTH EXTRACTION      Medications: Prior to Admission medications   Medication Sig Start Date End Date Taking? Authorizing Provider  albuterol (PROVENTIL HFA;VENTOLIN HFA) 108 (90 Base) MCG/ACT inhaler Inhale 1-2 puffs into the lungs every 6 (six) hours as needed for wheezing or shortness of breath. 04/27/18   Chase Picket, MD  fluticasone (FLONASE) 50 MCG/ACT nasal spray Place 2 sprays into both nostrils daily. 06/27/17   Tenna Delaine D, PA-C  Levonorgestrel-Ethinyl Estrad (VIENVA PO) Take by mouth.    [provider]  metFORMIN (GLUCOPHAGE) 500 MG tablet TAKE 1 TABLET(500 MG) BY MOUTH TWICE DAILY WITH A MEAL 02/05/19   Minette Brine, FNP  Multiple Vitamin (MULTI-VITAMIN DAILY PO) Multi Vitamin  daily    [provider]  propranolol (INDERAL) 60 MG tablet Take 60 mg by mouth 3 (three) times daily.    [provider]  SUMAtriptan (IMITREX) 50 MG tablet Take by mouth. 03/09/18   [provider]  Topiramate ER (TROKENDI XR) 25 MG CP24 Take 1 capsule (25 mg total) by mouth daily. 11/02/18   Minette Brine, FNP    Allergies:   Allergies  Allergen Reactions   Amlodipine Other (See Comments)    Chest tightness and worsening migraines   Pollen Extract Itching    Social History:  reports that she quit smoking about 4 years ago. Her smoking use included e-cigarettes. She has a 0.50 pack-year smoking history. She has never used smokeless tobacco. She reports that she does not currently use alcohol. She reports that she does not use drugs.  Family History: Family History  Problem Relation Age of Onset   Depression Mother    Diabetes Father    Heart disease Father     Physical Exam: Vitals:   03/31/22 2001 03/31/22 2015 03/31/22 2100 03/31/22 2145  BP: (!) 154/109 (!) 163/109 (!) 167/112 (!) 162/107  Pulse: 97 100 98 99  Resp: 20 18 (!) 21 14  Temp:    98 F (36.7 C)  TempSrc:      SpO2: 98% 99% 100% 99%  Weight:      Height:        General:  Alert and oriented times three, well  developed and nourished, no acute distress Eyes: PERRLA, pink conjunctiva, no scleral icterus ENT: Moist oral mucosa, neck supple, no thyromegaly Lungs: clear to ascultation, no wheeze, no crackles, no use of accessory muscles Cardiovascular: regular rate and rhythm, no regurgitation, no gallops, no murmurs. No carotid bruits, no JVD Abdomen: soft, positive BS, non-tender, non-distended, no organomegaly, not an acute abdomen GU: not examined Neuro: CN II - XII grossly intact,  sensation intact Musculoskeletal: strength 5/5 all extremities, no clubbing, cyanosis or edema Skin: no rash, no subcutaneous crepitation, no decubitus Psych: Hyper female   Labs on Admission:  Recent Labs    03/31/22 1955 03/31/22 2057  NA 132* 135  K 3.7 3.1*  CL 95* 99  CO2 17* 19*  GLUCOSE 404* 165*  BUN 5* <5*  CREATININE 0.70 0.51  CALCIUM 9.7 9.0    Recent Labs    03/31/22 1510 03/31/22 1746  WBC 8.1  --   HGB 15.0 12.9  HCT 44.0 38.0  MCV 80.9  --   PLT 382  --      Radiological Exams on Admission: No results found.  Assessment/Plan Present on Admission:  DKA (diabetic ketoacidosis) ([place]) -Endo tool initiated -IV insulin, IV hydration.   -Electrolyte replacement following serial BMP, mag and Phos levels  HTN -Patient's metoprolol resumed. -She is unclear of the dosages, family to bring bottles in a.m. -As needed blood pressure medications ordered  Ivey Nembhard 03/31/2022, 11:20 PM

## 2022-03-31 NOTE — ED Notes (Signed)
I-Stat venous blood gas, ED Resulted Abnormal result Collected: 03/31/2022 17:46 Last Updated: 03/31/2022 17:48 Status: Final result pH, Ven 7.377 [Ref Range: 7.25 - 7.43] pCO2, Ven 25.1 mmHg [Ref Range: 44 - 60] pO2, Ven 134 mmHg [Ref Range: 32 - 45] Bicarbonate 14.8 mmol/L [Ref Range: 20.0 - 28.0] TCO2 16 mmol/L [Ref Range: 22 - 32] O2 Saturation 99 % Acid-base deficit 9.0 mmol/L [Ref Range: 0.0 - 2.0] Sodium 133 mmol/L [Ref Range: 135 - 145] Potassium 3.2 mmol/L [Ref Range: 3.5 - 5.1] Calcium, Ion 1.19 mmol/L [Ref Range: 1.15 - 1.40] HCT 38.0 % [Ref Range: 36.0 - 46.0] Hemoglobin 12.9 g/dL [Ref Range: 12.0 - 15.0] Sample type VENOUS/ RT draw I stick

## 2022-03-31 NOTE — ED Notes (Signed)
Bolus #2 started at this time

## 2022-03-31 NOTE — ED Provider Notes (Signed)
[facility] Provider Note   CSN: [id number] Arrival date & time: 03/31/22  1444     History  Chief Complaint  Patient presents with   Hyperglycemia    Stacy Wang is a 27 y.o. female with a past medical history of diabetes type 2 presents today for evaluation of hyperglycemia.  Patient checked her blood sugar at home today which was found to be above 400s.  She was diagnosed with diabetes and was put on Trulicity however she has been noncompliant since October due to PCP change.  She endorses nausea without vomiting.  She endorses mild abdominal pain and dizziness.  Denies fever, chest pain, shortness of breath, bowel changes, urinary symptoms.   Hyperglycemia   Past Medical History:  Diagnosis Date   Bell palsy    Bell's palsy    Diabetes mellitus without complication ([place])    type 2   Hypertension    Migraine    Past Surgical History:  Procedure Laterality Date   WISDOM TOOTH EXTRACTION       Home Medications Prior to Admission medications   Medication Sig Start Date End Date Taking? Authorizing Provider  albuterol (PROVENTIL HFA;VENTOLIN HFA) 108 (90 Base) MCG/ACT inhaler Inhale 1-2 puffs into the lungs every 6 (six) hours as needed for wheezing or shortness of breath. 04/27/18   Chase Picket, MD  fluticasone (FLONASE) 50 MCG/ACT nasal spray Place 2 sprays into both nostrils daily. 06/27/17   Tenna Delaine D, PA-C  Levonorgestrel-Ethinyl Estrad (VIENVA PO) Take by mouth.    [provider]  metFORMIN (GLUCOPHAGE) 500 MG tablet TAKE 1 TABLET(500 MG) BY MOUTH TWICE DAILY WITH A MEAL 02/05/19   Minette Brine, FNP  Multiple Vitamin (MULTI-VITAMIN DAILY PO) Multi Vitamin  daily    [provider]  propranolol (INDERAL) 60 MG tablet Take 60 mg by mouth 3 (three) times daily.    [provider]  SUMAtriptan (IMITREX) 50 MG tablet Take by mouth. 03/09/18   [provider]  Topiramate ER  (TROKENDI XR) 25 MG CP24 Take 1 capsule (25 mg total) by mouth daily. 11/02/18   Minette Brine, FNP      Allergies    Amlodipine and Pollen extract    Review of Systems   Review of Systems Negative except as per HPI.  Physical Exam Updated Vital Signs BP (!) 163/109   Pulse 100   Temp 98 F (36.7 C) (Oral)   Resp 18   Ht 5\' 7"  (1.702 m)   Wt 135.6 kg   SpO2 99%   BMI 46.82 kg/m  Physical Exam Vitals and nursing note reviewed.  Constitutional:      Appearance: Normal appearance.  HENT:     Head: Normocephalic and atraumatic.     Mouth/Throat:     Mouth: Mucous membranes are moist.  Eyes:     General: No scleral icterus. Cardiovascular:     Rate and Rhythm: Regular rhythm. Tachycardia present.     Pulses: Normal pulses.     Heart sounds: Normal heart sounds.  Pulmonary:     Effort: Pulmonary effort is normal.     Breath sounds: Normal breath sounds.  Abdominal:     General: Abdomen is flat.     Palpations: Abdomen is soft.     Tenderness: There is no abdominal tenderness.  Musculoskeletal:        General: No deformity.  Skin:    General: Skin is warm.     Findings:  No rash.  Neurological:     General: No focal deficit present.     Mental Status: She is alert.  Psychiatric:        Mood and Affect: Mood normal.     ED Results / Procedures / Treatments   Labs (all labs ordered are listed, but only abnormal results are displayed) Labs Reviewed  BASIC METABOLIC PANEL - Abnormal; Notable for the following components:      Result Value   Sodium 132 (*)    Chloride 95 (*)    CO2 17 (*)    Glucose, Bld 407 (*)    BUN 5 (*)    Anion gap 20 (*)    All other components within normal limits  CBC - Abnormal; Notable for the following components:   RBC 5.44 (*)    All other components within normal limits  URINALYSIS, ROUTINE W REFLEX MICROSCOPIC - Abnormal; Notable for the following components:   Color, Urine COLORLESS (*)    Specific Gravity, Urine 1.039 (*)     Glucose, UA >1,000 (*)    Ketones, ur >80 (*)    Protein, ur TRACE (*)    Leukocytes,Ua TRACE (*)    All other components within normal limits  BETA-HYDROXYBUTYRIC ACID - Abnormal; Notable for the following components:   Beta-Hydroxybutyric Acid 7.13 (*)    All other components within normal limits  BASIC METABOLIC PANEL - Abnormal; Notable for the following components:   Sodium 133 (*)    Potassium 3.2 (*)    CO2 13 (*)    Glucose, Bld 318 (*)    BUN <5 (*)    Calcium 8.8 (*)    Anion gap 20 (*)    All other components within normal limits  BASIC METABOLIC PANEL - Abnormal; Notable for the following components:   Sodium 132 (*)    Chloride 95 (*)    CO2 17 (*)    Glucose, Bld 404 (*)    BUN 5 (*)    Anion gap 20 (*)    All other components within normal limits  CBG MONITORING, ED - Abnormal; Notable for the following components:   Glucose-Capillary 406 (*)    All other components within normal limits  CBG MONITORING, ED - Abnormal; Notable for the following components:   Glucose-Capillary 351 (*)    All other components within normal limits  CBG MONITORING, ED - Abnormal; Notable for the following components:   Glucose-Capillary 256 (*)    All other components within normal limits  I-STAT VENOUS BLOOD GAS, ED - Abnormal; Notable for the following components:   pCO2, Ven 25.1 (*)    pO2, Ven 134 (*)    Bicarbonate 14.8 (*)    TCO2 16 (*)    Acid-base deficit 9.0 (*)    Sodium 133 (*)    Potassium 3.2 (*)    All other components within normal limits  CBG MONITORING, ED - Abnormal; Notable for the following components:   Glucose-Capillary 145 (*)    All other components within normal limits  CBG MONITORING, ED - Abnormal; Notable for the following components:   Glucose-Capillary 163 (*)    All other components within normal limits  PREGNANCY, URINE  BETA-HYDROXYBUTYRIC ACID  BETA-HYDROXYBUTYRIC ACID    EKG None  Radiology No results  found.  Procedures Procedures    Medications Ordered in ED Medications  insulin regular, human (MYXREDLIN) 100 units/ 100 mL infusion (0 Units/hr Intravenous Stopped 03/31/22 1912)  lactated ringers infusion (  Intravenous New Bag/Given 03/31/22 1902)  dextrose 5 % in lactated ringers infusion (0 mLs Intravenous Hold 03/31/22 1724)  dextrose 50 % solution 0-50 mL (has no administration in time range)  lactated ringers bolus 2,712 mL (0 mLs Intravenous Stopped 03/31/22 1900)  potassium chloride 10 mEq in 100 mL IVPB (0 mEq Intravenous Stopped 03/31/22 1925)    ED Course/ Medical Decision Making/ A&P                             Medical Decision Making Amount and/or Complexity of Data Reviewed Labs: ordered.  Risk Prescription drug management.   This patient presents to the ED for hyperglycemia, this involves an extensive number of treatment options, and is a complaint that carries with a high risk of complications and morbidity.  The differential diagnosis includes DKA, HHS, electrolyte abnormalities, infection.  This is not an exhaustive list.  Lab tests: I ordered and personally interpreted labs.  The pertinent results include: WBC unremarkable. Hbg unremarkable. Platelets unremarkable. Electrolytes unremarkable. BUN, creatinine unremarkable.  Blood glucose over 400.  Keto urea.  Bicarb 14.8.  pH 7.37.  Potassium 3.6.  Anion gap 20.  Problem list/ ED course/ Critical interventions/ Medical management: HPI: See above Vital signs significant for heart rate of 105.  Otherwise within normal range and stable throughout visit. Laboratory/imaging studies significant for: See above. On physical examination, patient is afebrile and appears in no acute distress. This patient presents with hyperglycemia and symptoms concerning for DKA. Differential diagnosis includes other metabolic causes of hyperglycemia, HHS but likely medication noncompliance. Considered possible causes of DKA to include  infection (intrabdominal infection, UTI, pneumonia), infarction / ischemia (acute coronary syndrome, cerebral vascular accident, pulmonary embolism), medication non-compliance,  illicit substance abuse, iatrogenic (including prescription medications and drug-drug interactions), idiopathic causes. Most likely etiology at this time is medication noncompliance. Patient given fluids and started on insulin drip, admitted to the hospital. I have reviewed the patient home medicines and have made adjustments as needed.  Cardiac monitoring/EKG: The patient was maintained on a cardiac monitor.  I personally reviewed and interpreted the cardiac monitor which showed an underlying rhythm of: sinus rhythm.  Additional history obtained: External records from outside source obtained and reviewed including: Chart review including previous notes, labs, imaging.  Consultations obtained: I requested consultation with Dr. Velia Meyer, and discussed lab and imaging findings as well as pertinent plan.  He agrees to admit the patient.  Disposition Patient is admitted to the hospital. This chart was dictated using voice recognition software.  Despite best efforts to proofread,  errors can occur which can change the documentation meaning.          Final Clinical Impression(s) / ED Diagnoses Final diagnoses:  Hyperglycemia  Diabetic ketoacidosis without coma associated with type 2 diabetes mellitus [facility])    Rx / DC Orders ED Discharge Orders     None         Rex Kras, [place] 03/31/22 2049    Lajean Saver, MD 04/12/22 [phone]

## 2022-04-01 DIAGNOSIS — Z794 Long term (current) use of insulin: Secondary | ICD-10-CM | POA: Diagnosis not present

## 2022-04-01 DIAGNOSIS — Z8249 Family history of ischemic heart disease and other diseases of the circulatory system: Secondary | ICD-10-CM | POA: Diagnosis not present

## 2022-04-01 DIAGNOSIS — E1110 Type 2 diabetes mellitus with ketoacidosis without coma: Secondary | ICD-10-CM | POA: Diagnosis present

## 2022-04-01 DIAGNOSIS — Z7984 Long term (current) use of oral hypoglycemic drugs: Secondary | ICD-10-CM | POA: Diagnosis not present

## 2022-04-01 DIAGNOSIS — E876 Hypokalemia: Secondary | ICD-10-CM | POA: Diagnosis present

## 2022-04-01 DIAGNOSIS — Z79899 Other long term (current) drug therapy: Secondary | ICD-10-CM | POA: Diagnosis not present

## 2022-04-01 DIAGNOSIS — Z91148 Patient's other noncompliance with medication regimen for other reason: Secondary | ICD-10-CM | POA: Diagnosis not present

## 2022-04-01 DIAGNOSIS — Z91138 Patient's unintentional underdosing of medication regimen for other reason: Secondary | ICD-10-CM | POA: Diagnosis not present

## 2022-04-01 DIAGNOSIS — Z87891 Personal history of nicotine dependence: Secondary | ICD-10-CM | POA: Diagnosis not present

## 2022-04-01 DIAGNOSIS — Z833 Family history of diabetes mellitus: Secondary | ICD-10-CM | POA: Diagnosis not present

## 2022-04-01 DIAGNOSIS — I10 Essential (primary) hypertension: Secondary | ICD-10-CM | POA: Diagnosis present

## 2022-04-01 DIAGNOSIS — Z6841 Body Mass Index (BMI) 40.0 and over, adult: Secondary | ICD-10-CM | POA: Diagnosis not present

## 2022-04-01 DIAGNOSIS — Y92009 Unspecified place in unspecified non-institutional (private) residence as the place of occurrence of the external cause: Secondary | ICD-10-CM | POA: Diagnosis not present

## 2022-04-01 DIAGNOSIS — E785 Hyperlipidemia, unspecified: Secondary | ICD-10-CM | POA: Diagnosis present

## 2022-04-01 DIAGNOSIS — T50996A Underdosing of other drugs, medicaments and biological substances, initial encounter: Secondary | ICD-10-CM | POA: Diagnosis present

## 2022-04-01 DIAGNOSIS — Z888 Allergy status to other drugs, medicaments and biological substances status: Secondary | ICD-10-CM | POA: Diagnosis not present

## 2022-04-01 DIAGNOSIS — Z818 Family history of other mental and behavioral disorders: Secondary | ICD-10-CM | POA: Diagnosis not present

## 2022-04-01 DIAGNOSIS — Z793 Long term (current) use of hormonal contraceptives: Secondary | ICD-10-CM | POA: Diagnosis not present

## 2022-04-01 DIAGNOSIS — T383X6A Underdosing of insulin and oral hypoglycemic [antidiabetic] drugs, initial encounter: Secondary | ICD-10-CM | POA: Diagnosis present

## 2022-04-01 LAB — GLUCOSE, CAPILLARY
Glucose-Capillary: 133 mg/dL — ABNORMAL HIGH (ref 70–99)
Glucose-Capillary: 139 mg/dL — ABNORMAL HIGH (ref 70–99)
Glucose-Capillary: 148 mg/dL — ABNORMAL HIGH (ref 70–99)
Glucose-Capillary: 148 mg/dL — ABNORMAL HIGH (ref 70–99)
Glucose-Capillary: 155 mg/dL — ABNORMAL HIGH (ref 70–99)
Glucose-Capillary: 159 mg/dL — ABNORMAL HIGH (ref 70–99)
Glucose-Capillary: 169 mg/dL — ABNORMAL HIGH (ref 70–99)
Glucose-Capillary: 174 mg/dL — ABNORMAL HIGH (ref 70–99)
Glucose-Capillary: 177 mg/dL — ABNORMAL HIGH (ref 70–99)
Glucose-Capillary: 182 mg/dL — ABNORMAL HIGH (ref 70–99)
Glucose-Capillary: 199 mg/dL — ABNORMAL HIGH (ref 70–99)
Glucose-Capillary: 261 mg/dL — ABNORMAL HIGH (ref 70–99)
Glucose-Capillary: 274 mg/dL — ABNORMAL HIGH (ref 70–99)
Glucose-Capillary: 312 mg/dL — ABNORMAL HIGH (ref 70–99)

## 2022-04-01 LAB — BASIC METABOLIC PANEL
Anion gap: 11 (ref 5–15)
Anion gap: 10 (ref 5–15)
BUN: <5 mg/dL — ABNORMAL LOW (ref 6–20)
BUN: <5 mg/dL — ABNORMAL LOW (ref 6–20)
CO2: 22 mmol/L (ref 22–32)
CO2: 24 mmol/L (ref 22–32)
Calcium: 8.7 mg/dL — ABNORMAL LOW (ref 8.9–10.3)
Calcium: 8.7 mg/dL — ABNORMAL LOW (ref 8.9–10.3)
Chloride: 102 mmol/L (ref 98–111)
Chloride: 102 mmol/L (ref 98–111)
Creatinine, Ser: 0.5 mg/dL (ref 0.44–1.00)
Creatinine, Ser: 0.48 mg/dL (ref 0.44–1.00)
GFR, Estimated: >60 mL/min (ref >60)
GFR, Estimated: >60 mL/min (ref >60)
Glucose, Bld: 135 mg/dL — ABNORMAL HIGH (ref 70–99)
Glucose, Bld: 172 mg/dL — ABNORMAL HIGH (ref 70–99)
Potassium: 2.7 mmol/L — CL (ref 3.5–5.1)
Potassium: 3 mmol/L — ABNORMAL LOW (ref 3.5–5.1)
Sodium: 135 mmol/L (ref 135–145)
Sodium: 136 mmol/L (ref 135–145)

## 2022-04-01 LAB — BETA-HYDROXYBUTYRIC ACID
Beta-Hydroxybutyric Acid: 3.94 mmol/L — ABNORMAL HIGH (ref 0.05–0.27)
Beta-Hydroxybutyric Acid: 2.7 mmol/L — ABNORMAL HIGH (ref 0.05–0.27)

## 2022-04-01 LAB — MRSA NEXT GEN BY PCR, NASAL: MRSA by PCR Next Gen: NOT DETECTED

## 2022-04-01 MED ORDER — INSULIN ASPART 100 UNIT/ML IJ SOLN
0.0000 [IU] | Freq: Every day | INTRAMUSCULAR | Status: DC
  Administered 2022-04-01: 3 [IU] via SUBCUTANEOUS

## 2022-04-01 MED ORDER — AMLODIPINE BESYLATE 5 MG PO TABS: 5.0000 mg | ORAL_TABLET | Freq: Every day | ORAL | Status: DC

## 2022-04-01 MED ORDER — ACETAMINOPHEN 500 MG PO TABS
1000.0000 mg | ORAL_TABLET | ORAL | Status: AC | PRN
  Administered 2022-04-01: 1000 mg via ORAL
  Filled 2022-04-01: qty 2

## 2022-04-01 MED ORDER — ALBUTEROL SULFATE (2.5 MG/3ML) 0.083% IN NEBU: 3.0000 mL | INHALATION_SOLUTION | Freq: Four times a day (QID) | RESPIRATORY_TRACT | Status: DC | PRN

## 2022-04-01 MED ORDER — ENOXAPARIN SODIUM 40 MG/0.4ML IJ SOSY
40.0000 mg | PREFILLED_SYRINGE | INTRAMUSCULAR | Status: DC
  Administered 2022-04-01: 40 mg via SUBCUTANEOUS
  Filled 2022-04-01: qty 0.4

## 2022-04-01 MED ORDER — CLONAZEPAM 0.125 MG PO TBDP
0.2500 mg | ORAL_TABLET | Freq: Once | ORAL | Status: AC
  Administered 2022-04-01: 0.25 mg via ORAL
  Filled 2022-04-01: qty 2

## 2022-04-01 MED ORDER — METOPROLOL TARTRATE 25 MG PO TABS
50.0000 mg | ORAL_TABLET | Freq: Two times a day (BID) | ORAL | Status: DC
  Administered 2022-04-01: 25 mg via ORAL
  Administered 2022-04-01 – 2022-04-02 (×3): 50 mg via ORAL
  Filled 2022-04-01 (×4): qty 2

## 2022-04-01 MED ORDER — INSULIN DETEMIR 100 UNIT/ML ~~LOC~~ SOLN
25.0000 [IU] | Freq: Every day | SUBCUTANEOUS | Status: DC
  Administered 2022-04-01 – 2022-04-02 (×2): 25 [IU] via SUBCUTANEOUS
  Filled 2022-04-01 (×2): qty 0.25

## 2022-04-01 MED ORDER — POTASSIUM CHLORIDE 10 MEQ/100ML IV SOLN
10.0000 meq | INTRAVENOUS | Status: AC
  Administered 2022-04-01 (×4): 10 meq via INTRAVENOUS
  Filled 2022-04-01 (×4): qty 100

## 2022-04-01 MED ORDER — METOPROLOL TARTRATE 25 MG PO TABS
25.0000 mg | ORAL_TABLET | Freq: Two times a day (BID) | ORAL | Status: DC
  Administered 2022-04-01: 25 mg via ORAL
  Filled 2022-04-01: qty 1

## 2022-04-01 MED ORDER — POTASSIUM CHLORIDE CRYS ER 20 MEQ PO TBCR
40.0000 meq | EXTENDED_RELEASE_TABLET | ORAL | Status: AC
  Administered 2022-04-01 (×2): 40 meq via ORAL
  Filled 2022-04-01 (×2): qty 2

## 2022-04-01 MED ORDER — LIVING WELL WITH DIABETES BOOK
Freq: Once | Status: AC
  Filled 2022-04-01: qty 1

## 2022-04-01 MED ORDER — INSULIN STARTER KIT- PEN NEEDLES (ENGLISH)
1.0000 | Freq: Once | Status: AC
  Administered 2022-04-02: 1
  Filled 2022-04-01: qty 1

## 2022-04-01 MED ORDER — POTASSIUM CHLORIDE CRYS ER 20 MEQ PO TBCR
40.0000 meq | EXTENDED_RELEASE_TABLET | Freq: Once | ORAL | Status: AC
  Administered 2022-04-01: 40 meq via ORAL
  Filled 2022-04-01: qty 2

## 2022-04-01 MED ORDER — TOPIRAMATE ER 25 MG PO CAP24: 1.0000 | ORAL_CAPSULE | Freq: Every day | ORAL | Status: DC

## 2022-04-01 MED ORDER — ORAL CARE MOUTH RINSE: 15.0000 mL | OROMUCOSAL | Status: DC | PRN

## 2022-04-01 MED ORDER — INSULIN ASPART 100 UNIT/ML IJ SOLN
0.0000 [IU] | Freq: Three times a day (TID) | INTRAMUSCULAR | Status: DC
  Administered 2022-04-01: 15 [IU] via SUBCUTANEOUS
  Administered 2022-04-01: 11 [IU] via SUBCUTANEOUS
  Administered 2022-04-02: 7 [IU] via SUBCUTANEOUS

## 2022-04-01 NOTE — Progress Notes (Signed)
PROGRESS NOTE    Stacy Wang  [id number] DOB: Mar 30, 1995 DOA: 03/31/2022 PCP: Nena Polio, NP   Brief Narrative:  27 year old female with history of diabetes mellitus type 2, hypertension and hyperlipidemia, ran out of her medications since October 2023 because of insurance issues presented with nausea, vomiting, abdominal pain and extremely elevated blood sugars above more than 400.  On presentation, blood glucose was 404, bicarb of 13 and beta hydroxybutyrate of 3.94.  She was started on IV fluids and insulin drip.  Assessment & Plan:   DKA in a patient with diabetes mellitus type 2 -ran out of her medications since October 2023 because of insurance issues  -presented with nausea, vomiting, abdominal pain and extremely elevated blood sugars above more than 400.  On presentation, blood glucose was 404, bicarb of 13 and beta hydroxybutyrate of 3.94.  She was started on IV fluids and insulin drip. -Most recent BMP shows that the anion gap has closed although the beta-hydroxybutyrate acid is still high.  Patient is currently not vomiting and wanted to eat.  Will start carb modified diet.  Switch to long-acting subcutaneous insulin. -A1c is pending.  Diabetes coordinator consult.  TOC consult for help with meds and PCP needs  Hypokalemia -Replace.  Repeat a.m. labs  Hypertension -Monitor blood pressure.  Continue metoprolol for now.  Morbid obesity -Outpatient follow-up  DVT prophylaxis: SCDs Code Status: Full Family Communication: None at bedside Disposition Plan: Status is: Observation The patient will require care spanning > 2 midnights and should be moved to inpatient because: Of severity of illness.  Need to switch to long-acting insulin subcutaneously and monitor blood sugars  Consultants: None  Procedures: None  Antimicrobials: None   Subjective: Patient seen and examined at bedside.  Denies any current nausea or vomiting and wants to eat.  No fever, chest  pain, worsening shortness of breath reported.  Objective: Vitals:   04/01/22 0600 04/01/22 0640 04/01/22 0700 04/01/22 0730  BP: (!) 161/125 (!) 136/98 128/88   Pulse: 80 77 67   Resp: 18 16 16    Temp:    98 F (36.7 C)  TempSrc:    Oral  SpO2: 100% 99% 98%   Weight:      Height:        Intake/Output Summary (Last 24 hours) at 04/01/2022 1006 Last data filed at 04/01/2022 1001 Gross per 24 hour  Intake 2244.43 ml  Output --  Net 2244.43 ml   Filed Weights   03/31/22 1504 03/31/22 2300  Weight: 135.6 kg 118.3 kg    Examination:  General exam: Appears calm and comfortable.  On room air. Respiratory system: Bilateral decreased breath sounds at bases Cardiovascular system: S1 & S2 heard, Rate controlled Gastrointestinal system: Abdomen is morbidly obese, nondistended, soft and nontender. Normal bowel sounds heard. Extremities: No cyanosis, clubbing, edema  Central nervous system: Alert and oriented. No focal neurological deficits. Moving extremities Skin: No rashes, lesions or ulcers Psychiatry: Judgement and insight appear normal. Mood & affect appropriate.     Data Reviewed: I have personally reviewed following labs and imaging studies  CBC: Recent Labs  Lab 03/31/22 1510 03/31/22 1746  WBC 8.1  --   HGB 15.0 12.9  HCT 44.0 38.0  MCV 80.9  --   PLT 382  --    Basic Metabolic Panel: Recent Labs  Lab 03/31/22 1749 03/31/22 1955 03/31/22 2057 04/01/22 0321 04/01/22 0737  NA 133* 132* 135 135 136  K 3.2* 3.7 3.1* 2.7* 3.0*  CL  100 95* 99 102 102  CO2 13* 17* 19* 22 24  GLUCOSE 318* 404* 165* 135* 172*  BUN <5* 5* <5* <5* <5*  CREATININE 0.51 0.70 0.51 0.50 0.48  CALCIUM 8.8* 9.7 9.0 8.7* 8.7*   GFR: Estimated Creatinine Clearance: 139.3 mL/min (by C-G formula based on SCr of 0.48 mg/dL). Liver Function Tests: No results for input(s): "AST", "ALT", "ALKPHOS", "BILITOT", "PROT", "ALBUMIN" in the last 168 hours. No results for input(s): "LIPASE",  "AMYLASE" in the last 168 hours. No results for input(s): "AMMONIA" in the last 168 hours. Coagulation Profile: No results for input(s): "INR", "PROTIME" in the last 168 hours. Cardiac Enzymes: No results for input(s): "CKTOTAL", "CKMB", "CKMBINDEX", "TROPONINI" in the last 168 hours. BNP (last 3 results) No results for input(s): "PROBNP" in the last 8760 hours. HbA1C: No results for input(s): "HGBA1C" in the last 72 hours. CBG: Recent Labs  Lab 04/01/22 0523 04/01/22 0628 04/01/22 0727 04/01/22 0845 04/01/22 0944  GLUCAP 139* 148* 182* 169* 155*   Lipid Profile: No results for input(s): "CHOL", "HDL", "LDLCALC", "TRIG", "CHOLHDL", "LDLDIRECT" in the last 72 hours. Thyroid Function Tests: No results for input(s): "TSH", "T4TOTAL", "FREET4", "T3FREE", "THYROIDAB" in the last 72 hours. Anemia Panel: No results for input(s): "VITAMINB12", "FOLATE", "FERRITIN", "TIBC", "IRON", "RETICCTPCT" in the last 72 hours. Sepsis Labs: No results for input(s): "PROCALCITON", "LATICACIDVEN" in the last 168 hours.  Recent Results (from the past 240 hour(s))  MRSA Next Gen by PCR, Nasal     Status: None   Collection Time: 03/31/22 10:31 PM   Specimen: Nasal Mucosa; Nasal Swab  Result Value Ref Range Status   MRSA by PCR Next Gen NOT DETECTED NOT DETECTED Final    Comment: (NOTE) The GeneXpert MRSA Assay (FDA approved for NASAL specimens only), is one component of a comprehensive MRSA colonization surveillance program. It is not intended to diagnose MRSA infection nor to guide or monitor treatment for MRSA infections. Test performance is not FDA approved in patients less than 44 years old. Performed at [facility], [place] [address], [place], [place] [address]          Radiology Studies: No results found.      Scheduled Meds:  Chlorhexidine Gluconate Cloth  6 each Topical Daily   metoprolol tartrate  50 mg Oral BID   Topiramate ER  1 capsule Oral Daily    Continuous Infusions:  dextrose 5% lactated ringers 125 mL/hr at 04/01/22 1001   insulin 2.6 Units/hr (04/01/22 1001)   lactated ringers Stopped (03/31/22 2159)   potassium chloride 100 mL/hr at 04/01/22 1001          Ascencion Coye, MD [facility] 04/01/2022, 10:06 AM

## 2022-04-01 NOTE — Inpatient Diabetes Management (Signed)
Inpatient Diabetes Program Recommendations  AACE/ADA: New Consensus Statement on Inpatient Glycemic Control (2015)  Target Ranges:  Prepandial:   less than 140 mg/dL      Peak postprandial:   less than 180 mg/dL (1-2 hours)      Critically ill patients:  140 - 180 mg/dL   Lab Results  Component Value Date   GLUCAP 155 (H) 04/01/2022   HGBA1C 7.0 (H) 11/02/2018    Review of Glycemic Control  Diabetes history: DM2 Outpatient Diabetes medications: Previously on Trulicity, stopped on [id number]. Also been on Farxiga 10 QD Current orders for Inpatient glycemic control: IV insulin transitioning to SQ insulin Levemir 25 QD, Novolog 0-20 TID with meals and 0-5 HS  HgbA1C - 7.0%  Inpatient Diabetes Program Recommendations:    Decrease Novolog to 0-15 TID with meals and 0-5 HS  Will likely need meal coverage insulin - Novolog 3 units TID with meals if eating > 50%  Will check with pharmacy to see which insulins are preferred on her insurance policy.  Pt has appt with new PCP in May 2024. Spoke with pt about diabetes diagnosis and HgbA1C of 7% - average blood sugar of 154. Discussed basic pathoof DM2, home care, importance of checking blood sugars and maintaining good control to prevent longand short-term complications. Reviewed glucose and A1C goals. Reviewed hypoglycemia s/s and treatment, discussed impact of nutrition, exercise, stress, sickness, and medications on diabetes control. Ordered Living Well with diabetes book. Pt has glucose meter although has not been checking. Explained how PCP will review logbook and continue to make insulin adjustments if needed. Reviewed insulin pen administration. Pt was on Trulicity and Ozempic and feels ok with using insulin pen. Pt verbalized understanding of information. RNs to provide ongoing basic DM education at bedside. Will follow-up in am.  Thank you. Lorenda Peck, RD, LDN, [place] Inpatient Diabetes Coordinator [phone]

## 2022-04-01 NOTE — Plan of Care (Signed)

## 2022-04-02 ENCOUNTER — Other Ambulatory Visit (HOSPITAL_COMMUNITY): Payer: Self-pay

## 2022-04-02 ENCOUNTER — Other Ambulatory Visit (HOSPITAL_BASED_OUTPATIENT_CLINIC_OR_DEPARTMENT_OTHER): Payer: Self-pay

## 2022-04-02 DIAGNOSIS — E1110 Type 2 diabetes mellitus with ketoacidosis without coma: Secondary | ICD-10-CM | POA: Diagnosis not present

## 2022-04-02 LAB — HEMOGLOBIN A1C
Hgb A1c MFr Bld: >15.5 % — ABNORMAL HIGH (ref 4.8–5.6)
Mean Plasma Glucose: >398 mg/dL

## 2022-04-02 LAB — BASIC METABOLIC PANEL
Anion gap: 12 (ref 5–15)
BUN: <5 mg/dL — ABNORMAL LOW (ref 6–20)
CO2: 22 mmol/L (ref 22–32)
Calcium: 8.9 mg/dL (ref 8.9–10.3)
Chloride: 101 mmol/L (ref 98–111)
Creatinine, Ser: 0.51 mg/dL (ref 0.44–1.00)
GFR, Estimated: >60 mL/min (ref >60)
Glucose, Bld: 241 mg/dL — ABNORMAL HIGH (ref 70–99)
Potassium: 3.7 mmol/L (ref 3.5–5.1)
Sodium: 135 mmol/L (ref 135–145)

## 2022-04-02 LAB — GLUCOSE, CAPILLARY: Glucose-Capillary: 228 mg/dL — ABNORMAL HIGH (ref 70–99)

## 2022-04-02 MED ORDER — INSULIN GLARGINE 100 UNIT/ML ~~LOC~~ SOLN
20.0000 [IU] | Freq: Every day | SUBCUTANEOUS | 1 refills | Status: DC
  Filled 2022-04-02: qty 10, 28d supply, fill #0
  Filled 2022-04-29: qty 10, 28d supply, fill #1

## 2022-04-02 MED ORDER — METOPROLOL TARTRATE 50 MG PO TABS
50.0000 mg | ORAL_TABLET | Freq: Two times a day (BID) | ORAL | 1 refills | Status: DC
  Filled 2022-04-02: qty 60, 30d supply, fill #0
  Filled 2022-04-29: qty 60, 30d supply, fill #1

## 2022-04-02 MED ORDER — METFORMIN HCL 1000 MG PO TABS
1000.0000 mg | ORAL_TABLET | Freq: Two times a day (BID) | ORAL | 1 refills | Status: DC
  Filled 2022-04-02: qty 60, 30d supply, fill #0
  Filled 2022-04-29: qty 60, 30d supply, fill #1

## 2022-04-02 MED ORDER — LISINOPRIL 20 MG PO TABS
20.0000 mg | ORAL_TABLET | Freq: Every day | ORAL | 1 refills | Status: DC
  Filled 2022-04-02: qty 30, 30d supply, fill #0
  Filled 2022-04-29: qty 30, 30d supply, fill #1

## 2022-04-02 MED ORDER — ATORVASTATIN CALCIUM 40 MG PO TABS
40.0000 mg | ORAL_TABLET | Freq: Every day | ORAL | 11 refills | Status: DC
  Filled 2022-04-02: qty 30, 30d supply, fill #0
  Filled 2022-04-29: qty 30, 30d supply, fill #1

## 2022-04-02 MED ORDER — "ULTICARE INSULIN SYRINGE 30G X 1/2"" 0.5 ML MISC"
1.0000 | Freq: Once | 0 refills | Status: AC
  Filled 2022-04-02: qty 30, 30d supply, fill #0

## 2022-04-02 NOTE — TOC Initial Note (Signed)
Transition of Care [facility]) - Initial/Assessment Note    Patient Details  Name: Stacy Wang MRN: [id number] Date of Birth: 1995-11-06  Transition of Care [facility]) CM/SW Contact:    Roseanne Kaufman, RN Phone Number: 04/02/2022, 12:14 PM  Clinical Narrative:    Jackquline [place] consult for PCP needs, medication assistance, food assistance. This RNCM will follow for needs.               Expected Discharge Plan: Home/Self Care Barriers to Discharge: No Barriers Identified   Patient Goals and CMS Choice Patient states their goals for this hospitalization and ongoing recovery are:: return home and ability afford medications CMS Medicare.gov Compare Post Acute Care list provided to:: Patient Choice offered to / list presented to : Patient [place] ownership interest in [facility]provided to:: Patient    Expected Discharge Plan and Services In-house Referral: NA Discharge Planning Services: CM Consult Post Acute Care Choice: NA Living arrangements for the past 2 months: Apartment Expected Discharge Date: 04/02/22               DME Arranged: N/A DME Agency: NA       HH Arranged: NA [place] Agency: NA        Prior Living Arrangements/Services Living arrangements for the past 2 months: Apartment Lives with:: Relatives Patient language and need for interpreter reviewed:: Yes Do you feel safe going back to the place where you live?: Yes      Need for Family Participation in Patient Care: No (Comment) Care giver support system in place?: No (comment)   Criminal Activity/Legal Involvement Pertinent to Current Situation/Hospitalization: No - Comment as needed  Activities of Daily Living Home Assistive Devices/Equipment: None ADL Screening (condition at time of admission) Patient's cognitive ability adequate to safely complete daily activities?: Yes Is the patient deaf or have difficulty hearing?: No Does the patient have difficulty seeing, even when wearing  glasses/contacts?: No Does the patient have difficulty concentrating, remembering, or making decisions?: No Patient able to express need for assistance with ADLs?: No Does the patient have difficulty dressing or bathing?: No Independently performs ADLs?: Yes (appropriate for developmental age) Does the patient have difficulty walking or climbing stairs?: No Weakness of Legs: None Weakness of Arms/Hands: None  Permission Sought/Granted Permission sought to share information with : Case Manager Permission granted to share information with : Yes, Verbal Permission Granted  Share Information with NAME: Case manager           Emotional Assessment Appearance:: Appears stated age Attitude/Demeanor/Rapport: Gracious Affect (typically observed): Accepting Orientation: : Oriented to Self, Oriented to Place, Oriented to  Time, Oriented to Situation Alcohol / Substance Use: Illicit Drugs Psych Involvement: No (comment)  Admission diagnosis:  DKA (diabetic ketoacidosis) ([place]) [E11.10] Hyperglycemia [R73.9] Diabetic ketoacidosis without coma associated with type 2 diabetes mellitus ([place]) [E11.10] Patient Active Problem List   Diagnosis Date Noted   DKA (diabetic ketoacidosis) ([place]) 03/31/2022   Essential hypertension, benign 12/21/2017   Tachycardia 12/21/2017   Other abnormal glucose 12/21/2017   Major depressive disorder, recurrent severe without psychotic features ([place]) 05/20/2017   MDD (major depressive disorder), recurrent episode, severe ([place]) 05/20/2017   Right-sided Bell's palsy 02/07/2014   Shingles 02/07/2014   PCP:  Nena Polio, NP Pharmacy:   [facility] [place], [place] [address] Phone: [phone] Fax: [facility] [place] [address] Phone: [phone] Fax:  [phone]     Social Determinants of Health (SDOH) Social History: SDOH Screenings   Food Insecurity: Food Insecurity Present (04/01/2022)  Housing: Medium Risk (04/01/2022)  Transportation Needs: No Transportation Needs (03/31/2022)  Utilities: Not At Risk (04/01/2022)  Alcohol Screen: Medium Risk (05/21/2017)  Depression (PHQ2-9): Medium Risk (11/02/2018)  Tobacco Use: Medium Risk (03/31/2022)   SDOH Interventions: Housing Interventions: Inpatient TOC   Readmission Risk Interventions     No data to display

## 2022-04-02 NOTE — Discharge Summary (Signed)
Physician Discharge Summary  Patient ID: Stacy Wang MRN: [id number] DOB/AGE: 29-14-1997 29 y.o.  Admit date: 03/31/2022 Discharge date: 04/02/2022  Admission Diagnoses:  Discharge Diagnoses:  Principal Problem:   DKA (diabetic ketoacidosis) ([place]) Morbidly obese Essential hypertension Hypokalemia  Discharged Condition: stable  Hospital Course: Patient is a 27 year old female, morbidly obese, with past medical history significant for diabetes mellitus type 2, hypertension and hyperlipidemia.  Apparently, patient ran out of her medications since October 2023 because of insurance issues presented with nausea, vomiting, abdominal pain and extremely elevated blood sugars above more than 400. On presentation, blood glucose was 404, bicarb of 13 and beta hydroxybutyrate of 3.94. She was started on IV fluids and insulin drip.   DKA in a patient with diabetes mellitus type 2 -ran out of her medications since October 2023 because of insurance issues  -presented with nausea, vomiting, abdominal pain and extremely elevated blood sugars above more than 400.  On presentation, blood glucose was 404, bicarb of 13 and beta hydroxybutyrate of 3.94.  She was started on IV fluids and insulin drip. -Most recent BMP shows that the anion gap has closed although the beta-hydroxybutyrate acid is still high.  Patient is currently not vomiting and wanted to eat.  Will start carb modified diet.  Switch to long-acting subcutaneous insulin. -A1c is pending.  Diabetes coordinator consult.  TOC consult for help with meds and PCP needs   Hypokalemia -Replace.  Repeat a.m. labs -Resolved   Hypertension -Monitor blood pressure.  Continue metoprolol for now. -Goal blood pressure should be less than 130/80 mmHg   Morbid obesity -Outpatient follow-up  Consults: None  Discharge Exam: Blood pressure (!) 138/105, pulse 83, temperature 98.1 F (36.7 C), resp. rate 17, height 5\' 7"  (1.702 m), weight 118.3 kg, SpO2  98 %.   Disposition: Discharge disposition: 01-Home or Self Care       Discharge Instructions     Diet - low sodium heart healthy   Complete by: As directed    Diet Carb Modified   Complete by: As directed    Increase activity slowly   Complete by: As directed       Allergies as of 04/02/2022       Reactions   Amlodipine Other (See Comments)   Chest tightness and worsening migraines   Pollen Extract Itching        Medication List     STOP taking these medications    fluticasone 50 MCG/ACT nasal spray Commonly known as: FLONASE   Trokendi XR 25 MG Cp24 Generic drug: Topiramate ER       TAKE these medications    albuterol 108 (90 Base) MCG/ACT inhaler Commonly known as: VENTOLIN HFA Inhale 1-2 puffs into the lungs every 6 (six) hours as needed for wheezing or shortness of breath.   atorvastatin 40 MG tablet Commonly known as: Lipitor Take 1 tablet (40 mg total) by mouth daily.   insulin glargine 100 UNIT/ML injection Commonly known as: Lantus Inject 0.2 mLs (20 Units total) into the skin daily.   insulin starter kit- pen needles Misc 1 kit by Other route once for 1 dose.   lisinopril 20 MG tablet Commonly known as: ZESTRIL Take 1 tablet (20 mg total) by mouth daily.   metFORMIN 1000 MG tablet Commonly known as: GLUCOPHAGE Take 1 tablet (1,000 mg total) by mouth 2 (two) times daily with a meal. What changed:  medication strength See the new instructions.   metoprolol tartrate 50 MG tablet Commonly known  as: LOPRESSOR Take 1 tablet (50 mg total) by mouth 2 (two) times daily.         SignedBonnell Public 04/02/2022, 11:25 AM

## 2022-04-02 NOTE — TOC Transition Note (Signed)
Transition of Care [facility]) - CM/SW Discharge Note   Patient Details  Name: Stacy Wang MRN: [id number] Date of Birth: [birth year]  Transition of Care [facility]) CM/SW Contact:  Roseanne Kaufman, RN Phone Number: 04/02/2022, 12:16 PM   Clinical Narrative:    Per chart review patient seen at [facility] for DKA. This RNCM spoke with patient at bedside, patient reports she needs assistance affording her medications and food assistance. Patient reports she is the oldest child in her family and currently taking care of her younger brother. This RNCM attached social services resources to [facility], provided food pantry resources and coordinated medication assistance for patient at the [facility]. Patient reports she can afford $52.40 with use of manufacture coupon and will pick up her medications from [facility]. Patient reports she has a PCP appointment to establish care on 05/19/22 with Dr. Dorna Mai at [place] at [facility].   Transportation at discharge: patient's mother  No additional TOC needs at this time, signing off   Final next level of care: Home/Self Care Barriers to Discharge: No Barriers Identified   Patient Goals and CMS Choice CMS Medicare.gov Compare Post Acute Care list provided to:: Patient Choice offered to / list presented to : Patient  Discharge Placement                         Discharge Plan and Services Additional resources added to the After Visit Summary for   In-house Referral: NA Discharge Planning Services: CM Consult Post Acute Care Choice: NA          DME Arranged: N/A DME Agency: NA       HH Arranged: NA HH Agency: NA        Social Determinants of Health (SDOH) Interventions SDOH Screenings   Food Insecurity: Food Insecurity Present (04/01/2022)  Housing: Medium Risk (04/01/2022)  Transportation Needs: No Transportation Needs (03/31/2022)  Utilities: Not At Risk (04/01/2022)  Alcohol Screen: Medium Risk (05/21/2017)  Depression  (PHQ2-9): Medium Risk (11/02/2018)  Tobacco Use: Medium Risk (03/31/2022)     Readmission Risk Interventions     No data to display

## 2022-04-08 ENCOUNTER — Ambulatory Visit
Admission: RE | Admit: 2022-04-08 | Discharge: 2022-04-08 | Disposition: A | Discharge status: Home / Self Care | Payer: Commercial Managed Care - PPO | Source: Ambulatory Visit | Attending: Internal Medicine | Admitting: Internal Medicine

## 2022-04-08 VITALS — BP 134/83 | HR 74 | Temp 98.4°F | Resp 12

## 2022-04-08 DIAGNOSIS — B372 Candidiasis of skin and nail: Secondary | ICD-10-CM | POA: Insufficient documentation

## 2022-04-08 DIAGNOSIS — B3731 Acute candidiasis of vulva and vagina: Secondary | ICD-10-CM | POA: Diagnosis present

## 2022-04-08 MED ORDER — FLUCONAZOLE 150 MG PO TABS: 150.0000 mg | ORAL_TABLET | ORAL | 0 refills | Status: DC

## 2022-04-08 MED ORDER — NYSTATIN 100000 UNIT/GM EX CREA: TOPICAL_CREAM | CUTANEOUS | 0 refills | Status: DC

## 2022-04-08 NOTE — ED Provider Notes (Signed)
[name]    CSN: [id number] Arrival date & time: 04/08/22  0945      History   Chief Complaint Chief Complaint  Patient presents with   Vaginal Itching    I think I have a serious diabetic yeast infection - Entered by patient    HPI Stacy Wang is a 27 y.o. female.   Patient presents with vaginal itching, vaginal discharge, vaginal irritation that has been present daily for the past 4 months.  Patient is concerned for yeast infection related to uncontrolled diabetes.  Patient was recently hospitalized for DKA given that she was out of her diabetes medication since October 2023.  Her medications were refilled and she has been checking her blood sugar diligently.  She states that her blood sugar has been averaging 300s at home which is improved since discharge.  She has an appointment with PCP in May for follow-up.  She reports that she has tried 2 courses of Monistat topically with no improvement in symptoms.  She denies that she is sexually active and does not have any concern for STD.  Last menstrual cycle was sometime in February but patient is not sure of exact date but she denies concern for pregnancy given that she is not sexually active.  Patient denies pelvic pain, abdominal pain, back pain, fever, abnormal vaginal bleeding.   Vaginal Itching    Past Medical History:  Diagnosis Date   Bell palsy    Bell's palsy    Diabetes mellitus without complication ([place])    type 2   Hypertension    Migraine     Patient Active Problem List   Diagnosis Date Noted   DKA (diabetic ketoacidosis) ([place]) 03/31/2022   Essential hypertension, benign 12/21/2017   Tachycardia 12/21/2017   Other abnormal glucose 12/21/2017   Major depressive disorder, recurrent severe without psychotic features ([place]) 05/20/2017   MDD (major depressive disorder), recurrent episode, severe ([place]) 05/20/2017   Right-sided Bell's palsy 02/07/2014   Shingles 02/07/2014    Past Surgical  History:  Procedure Laterality Date   WISDOM TOOTH EXTRACTION      OB History   No obstetric history on file.      Home Medications    Prior to Admission medications   Medication Sig Start Date End Date Taking? Authorizing Provider  atorvastatin (LIPITOR) 40 MG tablet Take 1 tablet (40 mg total) by mouth daily. 04/02/22 04/02/23 Yes Bonnell Public, MD  fluconazole (DIFLUCAN) 150 MG tablet Take 1 tablet (150 mg total) by mouth every 3 (three) days. 04/08/22  Yes , Hildred Alamin E, FNP  insulin glargine (LANTUS) 100 UNIT/ML injection Inject 0.2 mLs (20 Units total) into the skin daily. Discard vial 28 days after first use. 04/02/22 07/11/22 Yes Ogbata, Babs Bertin, MD  lisinopril (ZESTRIL) 20 MG tablet Take 1 tablet (20 mg total) by mouth daily. 04/02/22 06/01/22 Yes Bonnell Public, MD  metFORMIN (GLUCOPHAGE) 1000 MG tablet Take 1 tablet (1,000 mg total) by mouth 2 (two) times daily with a meal. 04/02/22 06/01/22 Yes Bonnell Public, MD  metoprolol tartrate (LOPRESSOR) 50 MG tablet Take 1 tablet (50 mg total) by mouth 2 (two) times daily. 04/02/22 06/01/22 Yes Dana Allan I, MD  nystatin cream (MYCOSTATIN) Apply to affected area 2 times daily 04/08/22  Yes , [place] E, FNP  albuterol (PROVENTIL HFA;VENTOLIN HFA) 108 (90 Base) MCG/ACT inhaler Inhale 1-2 puffs into the lungs every 6 (six) hours as needed for wheezing or shortness of breath. Patient not taking:  Reported on 04/01/2022 04/27/18   Chase Picket, MD    Family History Family History  Problem Relation Age of Onset   Depression Mother    Diabetes Father    Heart disease Father     Social History Social History   Tobacco Use   Smoking status: Former    Packs/day: 0.25    Years: 2.00    Additional pack years: 0.00    Total pack years: 0.50    Types: E-cigarettes, Cigarettes    Quit date: 05/15/2017    Years since quitting: 4.9   Smokeless tobacco: Never  Vaping Use   Vaping Use: Every day  Substance Use  Topics   Alcohol use: Not Currently    Alcohol/week: 0.0 standard drinks of alcohol    Comment: 1/2 bottle liquor in one day for the week   Drug use: No     Allergies   Pollen extract   Review of Systems Review of Systems Per HPI  Physical Exam Triage Vital Signs ED Triage Vitals  Enc Vitals Group     BP 04/08/22 1010 134/83     Pulse Rate 04/08/22 1010 74     Resp 04/08/22 1010 12     Temp 04/08/22 1010 98.4 F (36.9 C)     Temp Source 04/08/22 1010 Oral     SpO2 04/08/22 1010 97 %     Weight --      Height --      Head Circumference --      Peak Flow --      Pain Score 04/08/22 1013 7     Pain Loc --      Pain Edu? --      Excl. in [place]? --    No data found.  Updated Vital Signs BP 134/83 (BP Location: Left Arm)   Pulse 74   Temp 98.4 F (36.9 C) (Oral)   Resp 12   LMP 03/04/2022   SpO2 97%   Visual Acuity Right Eye Distance:   Left Eye Distance:   Bilateral Distance:    Right Eye Near:   Left Eye Near:    Bilateral Near:     Physical Exam Exam conducted with a chaperone present.  Constitutional:      General: She is not in acute distress.    Appearance: Normal appearance. She is not toxic-appearing or diaphoretic.  HENT:     Head: Normocephalic and atraumatic.  Eyes:     Extraocular Movements: Extraocular movements intact.     Conjunctiva/sclera: Conjunctivae normal.  Pulmonary:     Effort: Pulmonary effort is normal.  Genitourinary:    Comments: Patient has diffuse erythema throughout outer labia and inner groin with maculopapular lesions.  No drainage noted.  Patient performed self vaginal swab. Neurological:     General: No focal deficit present.     Mental Status: She is alert and oriented to person, place, and time. Mental status is at baseline.  Psychiatric:        Mood and Affect: Mood normal.        Behavior: Behavior normal.        Thought Content: Thought content normal.        Judgment: Judgment normal.      UC Treatments /  Results  Labs (all labs ordered are listed, but only abnormal results are displayed) Labs Reviewed  CERVICOVAGINAL ANCILLARY ONLY    EKG   Radiology No results found.  Procedures Procedures (including critical care time)  Medications Ordered in UC Medications - No data to display  Initial Impression / Assessment and Plan / UC Course  I have reviewed the triage vital signs and the nursing notes.  Pertinent labs & imaging results that were available during my care of the patient were reviewed by me and considered in my medical decision making (see chart for details).     Symptoms and physical exam are consistent with candidiasis of the vaginal canal and the skin surrounding.  Patient's symptoms have been present for 4 months so do not have any concern for PID given that patient would be having more diffuse symptoms and complications especially if she is not having any pelvic or abdominal pain.  Therefore, pelvic exam was deferred.  Will prescribe Diflucan by mouth and topical nystatin to apply to the outside of vaginal area where irritation is occurring to skin.  Patient advised to follow-up with established gynecologist for further evaluation and management as well.  Cervicovaginal swab to test for BV and yeast pending.  Although, this is most likely vaginal yeast infection given uncontrolled blood sugars over the past few months.  Patient reports blood sugar has significantly improved since discharge from ED.  Encouraged her to continue to diligently monitor this and take her insulin as prescribed.  Patient requested refill on glucose test trips so this was called into pharmacy.  Discussed return precautions.  Patient verbalized understanding and was agreeable with plan. Final Clinical Impressions(s) / UC Diagnoses   Final diagnoses:  Vaginal yeast infection  Candidal skin infection     Discharge Instructions      Medication has been prescribed for yeast infection.  Vaginal swab  is pending.  Will call if it is abnormal.  Please follow-up with primary care and gynecology as well.    ED Prescriptions     Medication Sig Dispense Auth. Provider   nystatin cream (MYCOSTATIN) Apply to affected area 2 times daily 30 g Oswaldo Conroy E, [place]   fluconazole (DIFLUCAN) 150 MG tablet Take 1 tablet (150 mg total) by mouth every 3 (three) days. 3 tablet Kief, Michele Rockers, [place]      PDMP not reviewed this encounter.   Teodora Medici, [place] 04/08/22 1116

## 2022-04-08 NOTE — Discharge Instructions (Signed)
Medication has been prescribed for yeast infection.  Vaginal swab is pending.  Will call if it is abnormal.  Please follow-up with primary care and gynecology as well.

## 2022-04-08 NOTE — ED Triage Notes (Signed)
Pt is here for vaginal itching  x 2 months as per pt. Pt has tried generic 3 day treatment of monistat .

## 2022-04-09 ENCOUNTER — Telehealth (HOSPITAL_COMMUNITY): Payer: Self-pay | Admitting: Emergency Medicine

## 2022-04-09 LAB — CERVICOVAGINAL ANCILLARY ONLY
Bacterial Vaginitis (gardnerella): POSITIVE — AB
Candida Glabrata: NEGATIVE
Candida Vaginitis: POSITIVE — AB
Comment: NEGATIVE
Comment: NEGATIVE
Comment: NEGATIVE

## 2022-04-09 MED ORDER — METRONIDAZOLE 500 MG PO TABS: 500.0000 mg | ORAL_TABLET | Freq: Two times a day (BID) | ORAL | 0 refills | Status: DC

## 2022-05-18 ENCOUNTER — Ambulatory Visit: Payer: Self-pay | Admitting: Family Medicine

## 2022-05-19 ENCOUNTER — Ambulatory Visit (INDEPENDENT_AMBULATORY_CARE_PROVIDER_SITE_OTHER): Payer: Commercial Managed Care - PPO | Admitting: Family Medicine

## 2022-05-19 ENCOUNTER — Encounter: Payer: Self-pay | Admitting: Family Medicine

## 2022-05-19 VITALS — BP 116/81 | HR 71 | Temp 98.1°F | Resp 16 | Wt 255.8 lb

## 2022-05-19 DIAGNOSIS — N912 Amenorrhea, unspecified: Secondary | ICD-10-CM | POA: Insufficient documentation

## 2022-05-19 DIAGNOSIS — Z30011 Encounter for initial prescription of contraceptive pills: Secondary | ICD-10-CM

## 2022-05-19 DIAGNOSIS — Z7985 Long-term (current) use of injectable non-insulin antidiabetic drugs: Secondary | ICD-10-CM

## 2022-05-19 DIAGNOSIS — J029 Acute pharyngitis, unspecified: Secondary | ICD-10-CM | POA: Insufficient documentation

## 2022-05-19 DIAGNOSIS — Z794 Long term (current) use of insulin: Secondary | ICD-10-CM

## 2022-05-19 DIAGNOSIS — N926 Irregular menstruation, unspecified: Secondary | ICD-10-CM | POA: Insufficient documentation

## 2022-05-19 DIAGNOSIS — E785 Hyperlipidemia, unspecified: Secondary | ICD-10-CM | POA: Diagnosis not present

## 2022-05-19 DIAGNOSIS — E282 Polycystic ovarian syndrome: Secondary | ICD-10-CM | POA: Insufficient documentation

## 2022-05-19 DIAGNOSIS — E1169 Type 2 diabetes mellitus with other specified complication: Secondary | ICD-10-CM

## 2022-05-19 DIAGNOSIS — I10 Essential (primary) hypertension: Secondary | ICD-10-CM

## 2022-05-19 DIAGNOSIS — F603 Borderline personality disorder: Secondary | ICD-10-CM | POA: Insufficient documentation

## 2022-05-19 DIAGNOSIS — Z7984 Long term (current) use of oral hypoglycemic drugs: Secondary | ICD-10-CM | POA: Diagnosis not present

## 2022-05-19 DIAGNOSIS — R7302 Impaired glucose tolerance (oral): Secondary | ICD-10-CM | POA: Insufficient documentation

## 2022-05-19 DIAGNOSIS — R0982 Postnasal drip: Secondary | ICD-10-CM | POA: Insufficient documentation

## 2022-05-19 LAB — POCT GLYCOSYLATED HEMOGLOBIN (HGB A1C): Hemoglobin A1C: 15 % — AB (ref 4.0–5.6)

## 2022-05-19 MED ORDER — NORGESTIMATE-ETH ESTRADIOL 0.25-35 MG-MCG PO TABS: 1.0000 | ORAL_TABLET | Freq: Every day | ORAL | 1 refills | Status: DC

## 2022-05-19 NOTE — Progress Notes (Signed)
Patient is here to established care with provider today. Patient has many health concern they would like to discuss with provider today  Care gaps discuss at appointment today  

## 2022-05-19 NOTE — Progress Notes (Signed)
New Patient Office Visit  Subjective    Patient ID: Stacy Wang, female    DOB: [birth year]  Age: 27 y.o. MRN: [id number]  CC:  Chief Complaint  Patient presents with   Establish Care    HPI Stacy Wang presents to establish care and or review of chronic med issues. Patient denies acute complaints.    Outpatient Encounter Medications as of 05/19/2022  Medication Sig   atorvastatin (LIPITOR) 40 MG tablet Take 1 tablet (40 mg total) by mouth daily.   insulin glargine (LANTUS) 100 UNIT/ML injection Inject 0.2 mLs (20 Units total) into the skin daily. Discard vial 28 days after first use.   lisinopril (ZESTRIL) 20 MG tablet Take 1 tablet (20 mg total) by mouth daily.   metFORMIN (GLUCOPHAGE) 1000 MG tablet Take 1 tablet (1,000 mg total) by mouth 2 (two) times daily with a meal.   metoprolol tartrate (LOPRESSOR) 50 MG tablet Take 1 tablet (50 mg total) by mouth 2 (two) times daily.   albuterol (PROVENTIL HFA;VENTOLIN HFA) 108 (90 Base) MCG/ACT inhaler Inhale 1-2 puffs into the lungs every 6 (six) hours as needed for wheezing or shortness of breath. (Patient not taking: Reported on 04/01/2022)   fluconazole (DIFLUCAN) 150 MG tablet Take 1 tablet (150 mg total) by mouth every 3 (three) days. (Patient not taking: Reported on 05/19/2022)   metroNIDAZOLE (FLAGYL) 500 MG tablet Take 1 tablet (500 mg total) by mouth 2 (two) times daily. (Patient not taking: Reported on 05/19/2022)   nystatin cream (MYCOSTATIN) Apply to affected area 2 times daily (Patient not taking: Reported on 05/19/2022)   No facility-administered encounter medications on file as of 05/19/2022.    Past Medical History:  Diagnosis Date   Bell palsy    Bell's palsy    Diabetes mellitus without complication (HCC)    type 2   Hypertension    Migraine     Past Surgical History:  Procedure Laterality Date   WISDOM TOOTH EXTRACTION      Family History  Problem Relation Age of Onset   Depression Mother    Diabetes  Father    Heart disease Father     Social History   Socioeconomic History   Marital status: Single    Spouse name: Not on file   Number of children: 0   Years of education: Some colle   Highest education level: Not on file  Occupational History   Occupation: Personal assistant - customer service  Tobacco Use   Smoking status: Former    Packs/day: 0.25    Years: 2.00    Additional pack years: 0.00    Total pack years: 0.50    Types: E-cigarettes, Cigarettes    Quit date: 05/15/2017    Years since quitting: 5.0   Smokeless tobacco: Never  Vaping Use   Vaping Use: Every day  Substance and Sexual Activity   Alcohol use: Not Currently    Alcohol/week: 0.0 standard drinks of alcohol    Comment: 1/2 bottle liquor in one day for the week   Drug use: No   Sexual activity: Not Currently  Other Topics Concern   Not on file  Social History Narrative   Right handed.   Lives at home with roommates.   Caffeine occasionally - not daily.   Social Determinants of Health   Financial Resource Strain: Not on file  Food Insecurity: Food Insecurity Present (04/01/2022)   Hunger Vital Sign    Worried About Running Out of Food in the Last  Year: Often true    Ran Out of Food in the Last Year: Often true  Transportation Needs: No Transportation Needs (03/31/2022)   PRAPARE - Administrator, Civil Service (Medical): No    Lack of Transportation (Non-Medical): No  Physical Activity: Not on file  Stress: Not on file  Social Connections: Not on file  Intimate Partner Violence: Not At Risk (03/31/2022)   Humiliation, Afraid, Rape, and Kick questionnaire    Fear of Current or Ex-Partner: No    Emotionally Abused: No    Physically Abused: No    Sexually Abused: No    Review of Systems  All other systems reviewed and are negative.       Objective    BP 116/81   Pulse 71   Temp 98.1 F (36.7 C) (Oral)   Resp 16   Wt 255 lb 12.8 oz (116 kg)   SpO2 94%   BMI 40.06 kg/m    Physical Exam Vitals and nursing note reviewed.  Constitutional:      General: She is not in acute distress. Cardiovascular:     Rate and Rhythm: Normal rate and regular rhythm.  Pulmonary:     Effort: Pulmonary effort is normal.     Breath sounds: Normal breath sounds.  Abdominal:     Palpations: Abdomen is soft.     Tenderness: There is no abdominal tenderness.  Neurological:     General: No focal deficit present.     Mental Status: She is alert and oriented to person, place, and time.         Assessment & Plan:   1. Type 2 diabetes mellitus with other specified complication, with long-term current use of insulin (HCC) A1c remains extremely elevated. Discussed compliance. Schedule with Franky Macho for med management.  - POCT glycosylated hemoglobin (Hb A1C) - [facility] DIABETES FOOT EXAM  2. Essential hypertension Appears stable. continue  3. Hyperlipidemia, unspecified hyperlipidemia type Continue  4. Encounter for initial prescription of contraceptive pills Ortho cyclen prescribed    No follow-ups on file.   Tommie Raymond, MD

## 2022-05-20 ENCOUNTER — Ambulatory Visit: Payer: Commercial Managed Care - PPO | Admitting: Pharmacist

## 2022-05-21 ENCOUNTER — Other Ambulatory Visit: Payer: Self-pay

## 2022-05-21 ENCOUNTER — Encounter: Payer: Self-pay | Admitting: Pharmacist

## 2022-05-21 ENCOUNTER — Ambulatory Visit: Payer: Commercial Managed Care - PPO | Attending: Family Medicine | Admitting: Pharmacist

## 2022-05-21 DIAGNOSIS — Z794 Long term (current) use of insulin: Secondary | ICD-10-CM

## 2022-05-21 DIAGNOSIS — E119 Type 2 diabetes mellitus without complications: Secondary | ICD-10-CM

## 2022-05-21 DIAGNOSIS — Z7984 Long term (current) use of oral hypoglycemic drugs: Secondary | ICD-10-CM

## 2022-05-21 DIAGNOSIS — Z7985 Long-term (current) use of injectable non-insulin antidiabetic drugs: Secondary | ICD-10-CM

## 2022-05-21 MED ORDER — LANTUS SOLOSTAR 100 UNIT/ML ~~LOC~~ SOPN
26.0000 [IU] | PEN_INJECTOR | Freq: Every day | SUBCUTANEOUS | 1 refills | Status: DC
  Filled 2022-05-21: qty 9, 34d supply, fill #0

## 2022-05-21 MED ORDER — ACCU-CHEK SOFTCLIX LANCETS MISC: 3 refills | Status: DC

## 2022-05-21 MED ORDER — TRULICITY 0.75 MG/0.5ML ~~LOC~~ SOAJ
0.7500 mg | SUBCUTANEOUS | 1 refills | Status: DC
Start: 2022-05-21 — End: 2022-07-09
  Filled 2022-05-21: qty 2, 28d supply, fill #0
  Filled 2022-06-28 – 2022-07-06 (×2): qty 2, 28d supply, fill #1

## 2022-05-21 MED ORDER — ACCU-CHEK GUIDE VI STRP: ORAL_STRIP | 12 refills | Status: DC

## 2022-05-21 MED ORDER — ACCU-CHEK GUIDE W/DEVICE KIT: PACK | 0 refills | Status: DC

## 2022-05-21 MED ORDER — INSULIN PEN NEEDLE 32G X 4 MM MISC
2 refills | Status: DC
Start: 2022-05-21 — End: 2022-11-05
  Filled 2022-05-21: qty 100, 90d supply, fill #0

## 2022-05-21 NOTE — Progress Notes (Signed)
S:    PCP: Dr. Andrey Campanile   No chief complaint on file.  27 y.o. female who presents for diabetes evaluation, education, and management.  PMH is significant for T2DM with a hx of DKA, PCOS, HTN, obesity, MDD.  Patient was referred and last seen by Primary Care Provider, Dr. Andrey Campanile, on 05/19/2022. At last visit with Dr. Andrey Campanile, A1c was found to be 15.0%. Pt was instructed to follow-up with me.   Patient reports Diabetes was diagnosed in 2020. Used to take Ozempic but stopped d/t needle phobia. Eventually overcame this and transitioned to Trulicity but cost has been prohibitive. She now has insurance and is not opposed to injections.   Prior to her last PCP visit, pt was hospitalized 3/20-3/22/2024 w/ DKA. She informed the hospital team that she had been out of her medication since October, 2023. She was given IVF and insulin with improvement. She was discharged on metformin and glargine insulin. Patient tells me she has heart disease in her family. No personal hx of clinical ASCVD, CHF, CKD.    Family/Social History:  -Fhx: depression, DM, heart disease -Tobacco: does not smoke cigarettes - vapes.  -Alcohol: none since hospitalization  Current diabetes medications include: Lantus 20u daily, metformin 1000mg  BID Current hypertension medications include: lisinopril 20 mg daily, metoprolol tartrate 50 mg BID Current hyperlipidemia medications include: atorvastatin 40 mg daily   Patient reports adherence to taking all medications as prescribed.   Insurance coverage: [employer]  Patient denies hypoglycemic events.  Reported home fasting blood sugars: 280-290  Reported 2 hour post-meal/random blood sugars: 300s-340s depending on diet.  Patient reports polyuria Patient denies neuropathy (nerve pain). Patient reports visual changes. Patient denies self foot exams.   Patient reported dietary habits: Eats 3 meals/day Breakfast: fruit (berries), protein shake, keto cereal w/ 2% milk,  oatmeal on other days Lunch: varies - will sometimes not get a lunch. Salad w/ chips. Dinner: largest meal (rice and/or potatoes), salad, protein  Drinks: water, Simply (Lite) Lemonade   Patient-reported exercise habits: none  O:  7 day average blood glucose: no GM with her today.   No CGM in place.   Lab Results  Component Value Date   HGBA1C 15.0 (A) 05/19/2022   There were no vitals filed for this visit.  Lipid Panel     Component Value Date/Time   CHOL 163 07/26/2018 1544   TRIG 167 (H) 07/26/2018 1544   HDL 36 (L) 07/26/2018 1544   CHOLHDL 4.5 (H) 07/26/2018 1544   CHOLHDL 5.3 05/21/2017 0648   VLDL 22 05/21/2017 0648   LDLCALC 94 07/26/2018 1544    Clinical Atherosclerotic Cardiovascular Disease (ASCVD): No  The ASCVD Risk score (Arnett DK, et al., 2019) failed to calculate for the following reasons:   The 2019 ASCVD risk score is only valid for ages 72 to 87   Patient is participating in a Managed Medicaid Plan:  no   A/P: Diabetes longstanding currently uncontrolled. Patient is able to verbalize appropriate hypoglycemia management plan. Medication adherence appears to be appropriate. We will change her to Lantus pens and add back Trulicity now that she has insurance. We'll meet again in 4-6 weeks for medication adjustment.   -Discontinued Lantus vials.  -Start Lantus pens. Increase dose to 26u daily in the morning.  -Start Trulicity 0.75 mg weekly.  -Continue metformin 1000 mg BID.  -Patient educated on purpose, proper use, and potential adverse effects of Trulicity, insulin, and metformin.  -Extensively discussed pathophysiology of diabetes, recommended lifestyle  interventions, dietary effects on blood sugar control.  -Specifically, we talked about her comorbid PCOS contributing to increased insulin resistance and the need for therapy modification.  -Counseled on s/sx of and management of hypoglycemia.  -Next A1c anticipated 08/2022.   ASCVD risk - Primary  prevention in patient with diabetes. Last LDL is not at goal of <70 mg/dL. We need an updated lipid panel. I gave priority to DM today and will focus on this at follow-up. -Continued atorvastatin 40 mg.   Hypertension longstanding currently at goal given her last office BP. Blood pressure goal of <130/80 mmHg. Medication adherence reported. -Continued lisinopril and metoprolol at current doses.  Written patient instructions provided. Patient verbalized understanding of treatment plan.  Total time in face to face counseling 30 minutes.    Follow-up:  Pharmacist in 1 month.  Butch Penny, PharmD, [place], CPP Clinical Pharmacist [facility] [phone]

## 2022-05-25 ENCOUNTER — Encounter: Payer: Self-pay | Admitting: Family Medicine

## 2022-06-24 ENCOUNTER — Encounter: Payer: Self-pay | Admitting: Family Medicine

## 2022-06-24 ENCOUNTER — Ambulatory Visit (INDEPENDENT_AMBULATORY_CARE_PROVIDER_SITE_OTHER): Payer: Commercial Managed Care - PPO | Admitting: Family Medicine

## 2022-06-24 VITALS — BP 126/88 | HR 82 | Temp 98.1°F | Resp 16 | Wt 254.0 lb

## 2022-06-24 DIAGNOSIS — Z7984 Long term (current) use of oral hypoglycemic drugs: Secondary | ICD-10-CM

## 2022-06-24 DIAGNOSIS — Z7985 Long-term (current) use of injectable non-insulin antidiabetic drugs: Secondary | ICD-10-CM

## 2022-06-24 DIAGNOSIS — Z13220 Encounter for screening for lipoid disorders: Secondary | ICD-10-CM

## 2022-06-24 DIAGNOSIS — Z0001 Encounter for general adult medical examination with abnormal findings: Secondary | ICD-10-CM

## 2022-06-24 DIAGNOSIS — E1169 Type 2 diabetes mellitus with other specified complication: Secondary | ICD-10-CM

## 2022-06-24 DIAGNOSIS — Z130 Encounter for screening for diseases of the blood and blood-forming organs and certain disorders involving the immune mechanism: Secondary | ICD-10-CM

## 2022-06-24 DIAGNOSIS — Z0000 Encounter for general adult medical examination without abnormal findings: Secondary | ICD-10-CM

## 2022-06-24 MED ORDER — ATORVASTATIN CALCIUM 40 MG PO TABS: 40.0000 mg | ORAL_TABLET | Freq: Every day | ORAL | 1 refills | Status: DC

## 2022-06-24 MED ORDER — METFORMIN HCL 1000 MG PO TABS: 1000.0000 mg | ORAL_TABLET | Freq: Two times a day (BID) | ORAL | 1 refills | Status: DC

## 2022-06-24 MED ORDER — LISINOPRIL 20 MG PO TABS: 20.0000 mg | ORAL_TABLET | Freq: Every day | ORAL | 1 refills | Status: DC

## 2022-06-24 MED ORDER — METOPROLOL TARTRATE 50 MG PO TABS: 50.0000 mg | ORAL_TABLET | Freq: Two times a day (BID) | ORAL | 1 refills | Status: DC

## 2022-06-24 MED ORDER — ALBUTEROL SULFATE HFA 108 (90 BASE) MCG/ACT IN AERS: 1.0000 | INHALATION_SPRAY | Freq: Four times a day (QID) | RESPIRATORY_TRACT | 2 refills | Status: DC | PRN

## 2022-06-24 NOTE — Progress Notes (Deleted)
S:     No chief complaint on file.  27 y.o. female who presents for diabetes evaluation, education, and management.  PMH is significant for T2DM, hx of DKA, HTN, PCOS, obesity, MDD.   Patient was referred and last seen by Primary Care Provider, Dr. Andrey Campanile, on 06/24/2022, where patient established care. A1c 15%, down from 15.5%. Last seen by pharmacy clinic on 05/21/2022. At this visit, discontinued lantus vials and started lantus pens 26 units daily. Started trulicity 0.75 mg.  Today, patient arrives in *** good spirits and presents without *** any assistance. ***  Patient reports diabetes was diagnosed in 2020.   Family/Social History:  Fhx: depression, DM, heart disease Tobacco: vapes  Current diabetes medications include: Trulicity 0.75 mg weekly, lantus 26 units daily, metformin 1000 mg BID Current hypertension medications include: lisinopril 20 mg daily, metoprolol tartrate 50 mg BID Current hyperlipidemia medications include: atorvastatin 40 mg daily  Patient reports adherence to taking all medications as prescribed.  *** Patient denies adherence with medications, reports missing *** medications *** times per week, on average.  Do you feel that your medications are working for you? {YES NO:22349} Have you been experiencing any side effects to the medications prescribed? {YES NO:22349} Do you have any problems obtaining medications due to transportation or finances? {YES ZO:10960} Insurance coverage: [employer]  Patient {Actions; denies-reports:120008} hypoglycemic events.  Reported home fasting blood sugars: ***  Reported 2 hour post-meal/random blood sugars: ***.  Patient {Actions; denies-reports:120008} nocturia (nighttime urination).  Patient {Actions; denies-reports:120008} neuropathy (nerve pain). Patient {Actions; denies-reports:120008} visual changes. Patient {Actions; denies-reports:120008} self foot exams.   Patient reported dietary habits: Eats ***  meals/day Breakfast: *** Lunch: *** Dinner: *** Snacks: *** Drinks: ***  Within the past 12 months, did you worry whether your food would run out before you got money to buy more? {YES NO:22349} Within the past 12 months, did the food you bought run out, and you didn't have money to get more? {YES NO:22349} PHQ-9 Score: ***  Patient-reported exercise habits: ***   O:   ROS  Physical Exam  7 day average blood glucose: ***  *** CGM Download:  % Time CGM is active: ***% Average Glucose: *** mg/dL Glucose Management Indicator: ***  Glucose Variability: *** (goal <36%) Time in Goal:  - Time in range 70-180: ***% - Time above range: ***% - Time below range: ***% Observed patterns:   Lab Results  Component Value Date   HGBA1C 15.0 (A) 05/19/2022   There were no vitals filed for this visit.  Lipid Panel     Component Value Date/Time   CHOL 183 06/24/2022 1037   TRIG 93 06/24/2022 1037   HDL 44 06/24/2022 1037   CHOLHDL 4.2 06/24/2022 1037   CHOLHDL 5.3 05/21/2017 0648   VLDL 22 05/21/2017 0648   LDLCALC 122 (H) 06/24/2022 1037    Clinical Atherosclerotic Cardiovascular Disease (ASCVD): {YES/NO:21197} The ASCVD Risk score (Arnett DK, et al., 2019) failed to calculate for the following reasons:   The 2019 ASCVD risk score is only valid for ages 32 to 58   Patient is participating in a Managed Medicaid Plan:  {MM YES/NO:27447::"Yes"}   A/P: Diabetes longstanding *** currently ***. Patient is *** able to verbalize appropriate hypoglycemia management plan. Medication adherence appears ***. Control is suboptimal due to ***. -{Meds adjust:18428} basal insulin *** Lantus/Basaglar/Semglee (insulin glargine) *** Tresiba (insulin degludec) from *** units to *** units daily in the morning. Patient will continue to titrate 1 unit  every *** days if fasting blood sugar > 100mg /dl until fasting blood sugars reach goal or next visit.  -{Meds adjust:18428} rapid insulin *** Novolog  (insulin aspart) *** Humalog (insulin lispro) from *** to ***.  -{Meds adjust:18428} GLP-1 *** Trulicity (dulaglutide) *** Ozempic (semaglutide) *** Mounjaro (tirzepatide) from *** mg to *** mg .  -{Meds adjust:18428} SGLT2-I *** Farxiga (dapagliflozin) *** Jardiance (empagliflozin) 10 mg. Counseled on sick day rules. -{Meds adjust:18428} metformin ***.  -Patient educated on purpose, proper use, and potential adverse effects of ***.  -Extensively discussed pathophysiology of diabetes, recommended lifestyle interventions, dietary effects on blood sugar control.  -Counseled on s/sx of and management of hypoglycemia.  -Next A1c anticipated August 2024.   ASCVD risk - primary ***secondary prevention in patient with diabetes. Last LDL is *** not at goal of <45 *** mg/dL. ASCVD risk factors include *** and 10-year ASCVD risk score of ***. {Desc; low/moderate/high:110033} intensity statin indicated.  -{Meds adjust:18428} ***statin *** mg.   Hypertension longstanding *** currently ***. Blood pressure goal of <130/80 *** mmHg. Medication adherence ***. Blood pressure control is suboptimal due to ***. -{Meds adjust:18428} *** mg.  Written patient instructions provided. Patient verbalized understanding of treatment plan.  Total time in face to face counseling *** minutes.    Follow-up:  Pharmacist ***. PCP clinic visit in ***.  Patient seen with ***

## 2022-06-24 NOTE — Progress Notes (Signed)
Established Patient Office Visit  Subjective    Patient ID: Stacy Wang, female    DOB: July 13, 1995  Age: 27 y.o. MRN: [id number]  CC:  Chief Complaint  Patient presents with   Follow-up   Diabetes    HPI Stacy Wang presents for routine annual exam. Patient denies acute complaints or concerns.    Outpatient Encounter Medications as of 06/24/2022  Medication Sig   Accu-Chek Softclix Lancets lancets Use to check blood sugar three times daily   Blood Glucose Monitoring Suppl (ACCU-CHEK GUIDE) w/Device KIT Use to check blood sugar three times daily   Dulaglutide (TRULICITY) 0.75 MG/0.5ML SOPN Inject 0.75 mg into the skin once a week.   glucose blood (ACCU-CHEK GUIDE) test strip Use to check blood sugar three times daily   insulin glargine (LANTUS SOLOSTAR) 100 UNIT/ML Solostar Pen Inject 26 Units into the skin daily.   Insulin Pen Needle 32G X 4 MM MISC Use to inject Lantus once daily.   norgestimate-ethinyl estradiol (ORTHO-CYCLEN) 0.25-35 MG-MCG tablet Take 1 tablet by mouth daily.   [DISCONTINUED] atorvastatin (LIPITOR) 40 MG tablet Take 1 tablet (40 mg total) by mouth daily.   albuterol (VENTOLIN HFA) 108 (90 Base) MCG/ACT inhaler Inhale 1-2 puffs into the lungs every 6 (six) hours as needed for wheezing or shortness of breath.   atorvastatin (LIPITOR) 40 MG tablet Take 1 tablet (40 mg total) by mouth daily.   fluconazole (DIFLUCAN) 150 MG tablet Take 1 tablet (150 mg total) by mouth every 3 (three) days. (Patient not taking: Reported on 05/19/2022)   lisinopril (ZESTRIL) 20 MG tablet Take 1 tablet (20 mg total) by mouth daily.   metFORMIN (GLUCOPHAGE) 1000 MG tablet Take 1 tablet (1,000 mg total) by mouth 2 (two) times daily with a meal.   metoprolol tartrate (LOPRESSOR) 50 MG tablet Take 1 tablet (50 mg total) by mouth 2 (two) times daily.   metroNIDAZOLE (FLAGYL) 500 MG tablet Take 1 tablet (500 mg total) by mouth 2 (two) times daily. (Patient not taking: Reported on  05/19/2022)   nystatin cream (MYCOSTATIN) Apply to affected area 2 times daily (Patient not taking: Reported on 05/19/2022)   [DISCONTINUED] albuterol (PROVENTIL HFA;VENTOLIN HFA) 108 (90 Base) MCG/ACT inhaler Inhale 1-2 puffs into the lungs every 6 (six) hours as needed for wheezing or shortness of breath. (Patient not taking: Reported on 04/01/2022)   [DISCONTINUED] lisinopril (ZESTRIL) 20 MG tablet Take 1 tablet (20 mg total) by mouth daily.   [DISCONTINUED] metFORMIN (GLUCOPHAGE) 1000 MG tablet Take 1 tablet (1,000 mg total) by mouth 2 (two) times daily with a meal.   [DISCONTINUED] metoprolol tartrate (LOPRESSOR) 50 MG tablet Take 1 tablet (50 mg total) by mouth 2 (two) times daily.   No facility-administered encounter medications on file as of 06/24/2022.    Past Medical History:  Diagnosis Date   Bell palsy    Bell's palsy    Diabetes mellitus without complication (HCC)    type 2   Hypertension    Migraine     Past Surgical History:  Procedure Laterality Date   WISDOM TOOTH EXTRACTION      Family History  Problem Relation Age of Onset   Depression Mother    Diabetes Father    Heart disease Father     Social History   Socioeconomic History   Marital status: Single    Spouse name: Not on file   Number of children: 0   Years of education: Some colle   Highest education level:  Not on file  Occupational History   Occupation: Personal assistant - customer service  Tobacco Use   Smoking status: Former    Packs/day: 0.25    Years: 2.00    Additional pack years: 0.00    Total pack years: 0.50    Types: E-cigarettes, Cigarettes    Quit date: 05/15/2017    Years since quitting: 5.1   Smokeless tobacco: Never  Vaping Use   Vaping Use: Every day  Substance and Sexual Activity   Alcohol use: Not Currently    Alcohol/week: 0.0 standard drinks of alcohol    Comment: 1/2 bottle liquor in one day for the week   Drug use: No   Sexual activity: Not Currently  Other Topics Concern   Not  on file  Social History Narrative   Right handed.   Lives at home with roommates.   Caffeine occasionally - not daily.   Social Determinants of Health   Financial Resource Strain: Not on file  Food Insecurity: Food Insecurity Present (04/01/2022)   Hunger Vital Sign    Worried About Running Out of Food in the Last Year: Often true    Ran Out of Food in the Last Year: Often true  Transportation Needs: No Transportation Needs (03/31/2022)   PRAPARE - Administrator, Civil Service (Medical): No    Lack of Transportation (Non-Medical): No  Physical Activity: Not on file  Stress: Not on file  Social Connections: Not on file  Intimate Partner Violence: Not At Risk (03/31/2022)   Humiliation, Afraid, Rape, and Kick questionnaire    Fear of Current or Ex-Partner: No    Emotionally Abused: No    Physically Abused: No    Sexually Abused: No    Review of Systems  All other systems reviewed and are negative.       Objective    BP 126/88   Pulse 82   Temp 98.1 F (36.7 C) (Oral)   Resp 16   Wt 254 lb (115.2 kg)   SpO2 96%   BMI 39.78 kg/m   Physical Exam Vitals and nursing note reviewed.  Constitutional:      General: She is not in acute distress. HENT:     Head: Normocephalic and atraumatic.     Right Ear: Tympanic membrane, ear canal and external ear normal.     Left Ear: Tympanic membrane, ear canal and external ear normal.     Nose: Nose normal.     Mouth/Throat:     Mouth: Mucous membranes are moist.     Pharynx: Oropharynx is clear.  Eyes:     Conjunctiva/sclera: Conjunctivae normal.     Pupils: Pupils are equal, round, and reactive to light.  Neck:     Thyroid: No thyromegaly.  Cardiovascular:     Rate and Rhythm: Normal rate and regular rhythm.     Heart sounds: Normal heart sounds. No murmur heard. Pulmonary:     Effort: Pulmonary effort is normal. No respiratory distress.     Breath sounds: Normal breath sounds.  Abdominal:     General:  There is no distension.     Palpations: Abdomen is soft. There is no mass.     Tenderness: There is no abdominal tenderness.  Musculoskeletal:        General: Normal range of motion.     Cervical back: Normal range of motion and neck supple.  Skin:    General: Skin is warm and dry.  Neurological:  General: No focal deficit present.     Mental Status: She is alert and oriented to person, place, and time.  Psychiatric:        Mood and Affect: Mood normal.        Behavior: Behavior normal.         Assessment & Plan:   1. Annual physical exam  - CMP14+EGFR  2. Type 2 diabetes mellitus with other specified complication, with long-term current use of insulin (HCC)  - Microalbumin / creatinine urine ratio  3. Screening for lipid disorders  - Lipid Panel  4. Screening for deficiency anemia  - CBC with Differential    Return in about 3 months (around 09/24/2022) for follow up.   Tommie Raymond, MD

## 2022-06-25 ENCOUNTER — Ambulatory Visit: Payer: Commercial Managed Care - PPO | Admitting: Pharmacist

## 2022-06-25 LAB — CBC WITH DIFFERENTIAL/PLATELET
Basophils Absolute: 0 10*3/uL (ref 0.0–0.2)
Basos: 0 %
EOS (ABSOLUTE): 0.1 10*3/uL (ref 0.0–0.4)
Eos: 1 %
Hematocrit: 40.1 % (ref 34.0–46.6)
Hemoglobin: 13.2 g/dL (ref 11.1–15.9)
Immature Grans (Abs): 0 10*3/uL (ref 0.0–0.1)
Immature Granulocytes: 0 %
Lymphocytes Absolute: 2.4 10*3/uL (ref 0.7–3.1)
Lymphs: 31 %
MCH: 28.8 pg (ref 26.6–33.0)
MCHC: 32.9 g/dL (ref 31.5–35.7)
MCV: 88 fL (ref 79–97)
Monocytes Absolute: 0.4 10*3/uL (ref 0.1–0.9)
Monocytes: 5 %
Neutrophils Absolute: 4.7 10*3/uL (ref 1.4–7.0)
Neutrophils: 63 %
Platelets: 338 10*3/uL (ref 150–450)
RBC: 4.58 x10E6/uL (ref 3.77–5.28)
RDW: 12.6 % (ref 11.7–15.4)
WBC: 7.6 10*3/uL (ref 3.4–10.8)

## 2022-06-25 LAB — CMP14+EGFR
ALT: 11 IU/L (ref 0–32)
AST: 14 IU/L (ref 0–40)
Albumin/Globulin Ratio: 1.5
Albumin: 4 g/dL (ref 4.0–5.0)
Alkaline Phosphatase: 94 IU/L (ref 44–121)
BUN/Creatinine Ratio: 9 (ref 9–23)
BUN: 5 mg/dL — ABNORMAL LOW (ref 6–20)
Bilirubin Total: 0.4 mg/dL (ref 0.0–1.2)
CO2: 22 mmol/L (ref 20–29)
Calcium: 9.4 mg/dL (ref 8.7–10.2)
Chloride: 103 mmol/L (ref 96–106)
Creatinine, Ser: 0.54 mg/dL — ABNORMAL LOW (ref 0.57–1.00)
Globulin, Total: 2.7 g/dL (ref 1.5–4.5)
Glucose: 127 mg/dL — ABNORMAL HIGH (ref 70–99)
Potassium: 4.3 mmol/L (ref 3.5–5.2)
Sodium: 141 mmol/L (ref 134–144)
Total Protein: 6.7 g/dL (ref 6.0–8.5)
eGFR: 129 mL/min/{1.73_m2} (ref >59)

## 2022-06-25 LAB — LIPID PANEL
Chol/HDL Ratio: 4.2 ratio (ref 0.0–4.4)
Cholesterol, Total: 183 mg/dL (ref 100–199)
HDL: 44 mg/dL (ref >39)
LDL Chol Calc (NIH): 122 mg/dL — ABNORMAL HIGH (ref 0–99)
Triglycerides: 93 mg/dL (ref 0–149)
VLDL Cholesterol Cal: 17 mg/dL (ref 5–40)

## 2022-06-25 LAB — MICROALBUMIN / CREATININE URINE RATIO
Creatinine, Urine: 198 mg/dL
Microalb/Creat Ratio: 10 mg/g creat (ref 0–29)
Microalbumin, Urine: 20.5 ug/mL

## 2022-06-28 ENCOUNTER — Other Ambulatory Visit: Payer: Self-pay

## 2022-06-30 ENCOUNTER — Other Ambulatory Visit: Payer: Self-pay

## 2022-07-01 ENCOUNTER — Other Ambulatory Visit: Payer: Self-pay

## 2022-07-02 ENCOUNTER — Other Ambulatory Visit: Payer: Self-pay

## 2022-07-05 ENCOUNTER — Other Ambulatory Visit: Payer: Self-pay

## 2022-07-06 ENCOUNTER — Other Ambulatory Visit: Payer: Self-pay

## 2022-07-07 ENCOUNTER — Other Ambulatory Visit: Payer: Self-pay

## 2022-07-09 ENCOUNTER — Ambulatory Visit: Payer: Commercial Managed Care - PPO | Attending: Family Medicine | Admitting: Pharmacist

## 2022-07-09 ENCOUNTER — Other Ambulatory Visit: Payer: Self-pay

## 2022-07-09 ENCOUNTER — Encounter: Payer: Self-pay | Admitting: Pharmacist

## 2022-07-09 DIAGNOSIS — Z794 Long term (current) use of insulin: Secondary | ICD-10-CM | POA: Diagnosis not present

## 2022-07-09 DIAGNOSIS — E119 Type 2 diabetes mellitus without complications: Secondary | ICD-10-CM | POA: Diagnosis not present

## 2022-07-09 DIAGNOSIS — Z7985 Long-term (current) use of injectable non-insulin antidiabetic drugs: Secondary | ICD-10-CM | POA: Diagnosis not present

## 2022-07-09 DIAGNOSIS — Z7984 Long term (current) use of oral hypoglycemic drugs: Secondary | ICD-10-CM

## 2022-07-09 MED ORDER — ONETOUCH VERIO VI STRP
ORAL_STRIP | 2 refills | Status: DC
  Filled 2022-07-09: qty 100, 33d supply, fill #0

## 2022-07-09 MED ORDER — ACCU-CHEK GUIDE W/DEVICE KIT
PACK | 0 refills | Status: DC
Start: 2022-07-09 — End: 2022-07-09
  Filled 2022-07-09: qty 1, fill #0

## 2022-07-09 MED ORDER — LANTUS SOLOSTAR 100 UNIT/ML ~~LOC~~ SOPN
26.0000 [IU] | PEN_INJECTOR | Freq: Every day | SUBCUTANEOUS | 1 refills | Status: DC
Start: 2022-07-09 — End: 2022-07-09
  Filled 2022-07-09: qty 15, 57d supply, fill #0

## 2022-07-09 MED ORDER — ONETOUCH VERIO W/DEVICE KIT
PACK | 0 refills | Status: DC
  Filled 2022-07-09: qty 1, 30d supply, fill #0

## 2022-07-09 MED ORDER — ALBUTEROL SULFATE HFA 108 (90 BASE) MCG/ACT IN AERS
1.0000 | INHALATION_SPRAY | Freq: Four times a day (QID) | RESPIRATORY_TRACT | 2 refills | Status: AC | PRN
  Filled 2022-07-09 – 2023-04-02 (×2): qty 18, 25d supply, fill #0

## 2022-07-09 MED ORDER — ONETOUCH DELICA LANCETS 33G MISC
2 refills | Status: DC
  Filled 2022-07-09: qty 100, 33d supply, fill #0

## 2022-07-09 MED ORDER — ACCU-CHEK GUIDE VI STRP
ORAL_STRIP | 12 refills | Status: DC
Start: 2022-07-09 — End: 2022-07-09
  Filled 2022-07-09: qty 100, 33d supply, fill #0

## 2022-07-09 MED ORDER — NORGESTIMATE-ETH ESTRADIOL 0.25-35 MG-MCG PO TABS
1.0000 | ORAL_TABLET | Freq: Every day | ORAL | 1 refills | Status: DC
  Filled 2022-07-09: qty 84, 84d supply, fill #0

## 2022-07-09 MED ORDER — ATORVASTATIN CALCIUM 40 MG PO TABS
40.0000 mg | ORAL_TABLET | Freq: Every day | ORAL | 1 refills | Status: DC
  Filled 2022-07-09: qty 90, 90d supply, fill #0

## 2022-07-09 MED ORDER — METOPROLOL TARTRATE 50 MG PO TABS
50.0000 mg | ORAL_TABLET | Freq: Two times a day (BID) | ORAL | 1 refills | Status: DC
  Filled 2022-07-09: qty 180, 90d supply, fill #0

## 2022-07-09 MED ORDER — METFORMIN HCL 1000 MG PO TABS
1000.0000 mg | ORAL_TABLET | Freq: Two times a day (BID) | ORAL | 1 refills | Status: DC
  Filled 2022-07-09: qty 180, 90d supply, fill #0

## 2022-07-09 MED ORDER — TRULICITY 0.75 MG/0.5ML ~~LOC~~ SOAJ
0.7500 mg | SUBCUTANEOUS | 1 refills | Status: DC
Start: 2022-07-09 — End: 2022-10-05
  Filled 2022-08-20: qty 2, 28d supply, fill #0

## 2022-07-09 MED ORDER — INSULIN GLARGINE-YFGN 100 UNIT/ML ~~LOC~~ SOPN
26.0000 [IU] | PEN_INJECTOR | Freq: Every day | SUBCUTANEOUS | 2 refills | Status: DC
  Filled 2022-07-09: qty 9, 34d supply, fill #0

## 2022-07-09 MED ORDER — LISINOPRIL 20 MG PO TABS
20.0000 mg | ORAL_TABLET | Freq: Every day | ORAL | 1 refills | Status: DC
  Filled 2022-07-09: qty 90, 90d supply, fill #0

## 2022-07-09 MED ORDER — ACCU-CHEK SOFTCLIX LANCETS MISC
3 refills | Status: DC
Start: 2022-07-09 — End: 2022-07-09
  Filled 2022-07-09: qty 100, fill #0

## 2022-07-09 NOTE — Progress Notes (Signed)
S:     No chief complaint on file.  27 y.o. female who presents for diabetes evaluation, education, and management.  PMH is significant for T2DM, hx of DKA, HTN, PCOS, MDD, obesity.   Patient was referred and last seen by Primary Care Provider, Dr. Andrey Campanile, on 06/24/23/08/2022. At that visit, patient established care. A1c was 15% (down from >15.5% in March). Last seen by pharmacy clinic on 05/21/2022. Discontinued Lantus vials 20 units and started Lantus pens 26 units daily and Trulicity 0.75 mg weekly.  Patient arrives in good spirits and presents without any assistance. Reports has been out of Trulicity for ~2 weeks due to unable to obtain from pharmacy. Recently restarted metformin after seeing Dr. Andrey Campanile, of note reports blood sugars < 180 when she was only taking the Trulicity and Lantus. Currently not checking blood sugars because she is scared to see what her sugars are without the Trulicity. Patient requests refills for test strips.  Current diabetes medications include: Trulicity 0.75 mg weekly (not taking), Lantus 26 units daily, metformin 1000 mg BID (restarted taking on 6/13) Current hypertension medications include: lisinopril 20 mg daily, metoprolol tartrate 50 mg BID Current hyperlipidemia medications include: atorvastatin 40 mg daily  Patient denies hypoglycemic events.  Reported home fasting blood sugars: not checking  Reported 2 hour post-meal/random blood sugars: not checking  Patient denies nocturia (nighttime urination).  Patient denies neuropathy (nerve pain). Patient denies visual changes. Patient reports self foot exams.   Family/Social History:  -Fhx: depression, DM, heart disease -Tobacco: vapes  Insurance coverage: [employer]  Patient reported dietary habits: No changes from last visit  Patient-reported exercise habits: none  O:   ROS  Physical Exam  7 day average blood glucose: Did not bring meter with her  Lab Results  Component Value Date    HGBA1C 15.0 (A) 05/19/2022   There were no vitals filed for this visit.  Lipid Panel     Component Value Date/Time   CHOL 183 06/24/2022 1037   TRIG 93 06/24/2022 1037   HDL 44 06/24/2022 1037   CHOLHDL 4.2 06/24/2022 1037   CHOLHDL 5.3 05/21/2017 0648   VLDL 22 05/21/2017 0648   LDLCALC 122 (H) 06/24/2022 1037    Clinical Atherosclerotic Cardiovascular Disease (ASCVD): No  The ASCVD Risk score (Arnett DK, et al., 2019) failed to calculate for the following reasons:   The 2019 ASCVD risk score is only valid for ages 43 to 64   Patient is participating in a Managed Medicaid Plan: No  A/P: Diabetes longstanding currently uncontrolled based on last A1c. Patient is able to verbalize appropriate hypoglycemia management plan. Medication adherence appears to be suboptimal. Control is suboptimal due to medication non adherence. At this time, did not increase Trulicity to 1.5 mg due to patient not on 0.75 mg for two weeks. Will restart 0.75 mg x 4 weeks. Sent prescriptions for Trulicity, insulin, and one touch testing supplies to pharmacy and confirmed that they were picked up today.  -Continued Semglee (insulin glargine) 26 units daily.  -Restarted Trulicity (dulaglutide) 0.75 mg weekly.  - Continued Metformin 1000 mg BID daily. -Patient educated on purpose, proper use, and potential adverse effects of Trulicity.  -Extensively discussed pathophysiology of diabetes, recommended lifestyle interventions, dietary effects on blood sugar control.  -Counseled on s/sx of and management of hypoglycemia.  -Next A1c anticipated August 2024.   ASCVD risk - primary prevention in patient with diabetes. Last LDL is 122 not at goal of <16 mg/dL. ASCVD risk  factors include HTN, DM, and family history high intensity statin indicated.  -Continued atorvastatin 40 mg.   Hypertension longstanding currently controlled based on last office vist, BP 126/88. Blood pressure goal of <130/80 mmHg. Medication  adherence appropriate. -Continued lisinopril 20 mg daily, metoprolol tartrate 50 mg BID.  Written patient instructions provided. Patient verbalized understanding of treatment plan.  Total time in face to face counseling 30 minutes.    Follow-up:  Pharmacist August.   Patient seen with  Alesia Banda, PharmD Candidate [employer]   Butch Penny, PharmD, [place], CPP Clinical Pharmacist [facility] [phone]

## 2022-07-13 ENCOUNTER — Other Ambulatory Visit: Payer: Self-pay

## 2022-07-14 ENCOUNTER — Ambulatory Visit
Admission: EM | Admit: 2022-07-14 | Discharge: 2022-07-14 | Disposition: A | Discharge status: Home / Self Care | Payer: Commercial Managed Care - PPO | Attending: Family Medicine | Admitting: Family Medicine

## 2022-07-14 DIAGNOSIS — E1165 Type 2 diabetes mellitus with hyperglycemia: Secondary | ICD-10-CM

## 2022-07-14 DIAGNOSIS — Z794 Long term (current) use of insulin: Secondary | ICD-10-CM

## 2022-07-14 DIAGNOSIS — T50905A Adverse effect of unspecified drugs, medicaments and biological substances, initial encounter: Secondary | ICD-10-CM | POA: Diagnosis not present

## 2022-07-14 DIAGNOSIS — R112 Nausea with vomiting, unspecified: Secondary | ICD-10-CM | POA: Diagnosis not present

## 2022-07-14 LAB — POCT URINALYSIS DIP (MANUAL ENTRY)
Glucose, UA: NEGATIVE mg/dL
Nitrite, UA: NEGATIVE
Protein Ur, POC: 30 mg/dL — AB
Spec Grav, UA: >=1.03 — AB (ref 1.010–1.025)
Urobilinogen, UA: 0.2 E.U./dL
pH, UA: 6 (ref 5.0–8.0)

## 2022-07-14 LAB — POCT FASTING CBG KUC MANUAL ENTRY: POCT Glucose (KUC): 169 mg/dL — AB (ref 70–99)

## 2022-07-14 MED ORDER — ONDANSETRON 8 MG PO TBDP: 8.0000 mg | ORAL_TABLET | Freq: Three times a day (TID) | ORAL | 0 refills | Status: DC | PRN

## 2022-07-14 NOTE — Discharge Instructions (Addendum)
Follow-up with PC regarding metformin dose and reaction. Today hold Metformin. Included a list of foods that can are diabetic friendly. Protein shakes today ok if unable to tolerate food.

## 2022-07-14 NOTE — ED Provider Notes (Signed)
[name]    CSN: [id number] Arrival date & time: 07/14/22  4098      History   Chief Complaint Chief Complaint  Patient presents with   Emesis    HPI Stacy Wang is a 27 y.o. female, with a history of uncontrolled type 2 diabetes, MDD, GAD, hypertension. HPI Patient  with a recent A1c of 15, resents today with 5-day history of vomiting and with concerns that it has to do with recently restarting metformin.  Also reports she has not had a normal bowel movement in approximately 1 week.  Reports that vomiting occurs when eating small meals.  Denies any urinary symptoms.  Patient refused urine pregnancy testing today. Patient's last menstrual period was 07/07/2022 (approximate).  Past Medical History:  Diagnosis Date   Bell palsy    Bell's palsy    Diabetes mellitus without complication (HCC)    type 2   Hypertension    Migraine     Patient Active Problem List   Diagnosis Date Noted   Sore throat 05/19/2022   Postnasal drip 05/19/2022   Polycystic ovaries 05/19/2022   Irregular periods 05/19/2022   Impaired glucose tolerance 05/19/2022   Borderline personality disorder (HCC) 05/19/2022   Amenorrhea 05/19/2022   DKA (diabetic ketoacidosis) (HCC) 03/31/2022   PCOS (polycystic ovarian syndrome) 04/27/2018   ASCUS with positive high risk HPV cervical 04/27/2018   Morbid obesity (HCC) 04/06/2018   Type 2 diabetes mellitus without complication, without long-term current use of insulin (HCC) 03/15/2018   Essential hypertension, benign 12/21/2017   Tachycardia 12/21/2017   Other abnormal glucose 12/21/2017   Major depressive disorder, recurrent severe without psychotic features (HCC) 05/20/2017   MDD (major depressive disorder), recurrent episode, severe (HCC) 05/20/2017   Hypertension 01/19/2017   Elevated blood-pressure reading without diagnosis of hypertension 10/29/2015   Right-sided Bell's palsy 02/07/2014   Shingles 02/07/2014    Past Surgical  History:  Procedure Laterality Date   WISDOM TOOTH EXTRACTION      OB History   No obstetric history on file.      Home Medications    Prior to Admission medications   Medication Sig Start Date End Date Taking? Authorizing Provider  atorvastatin (LIPITOR) 40 MG tablet Take 1 tablet (40 mg total) by mouth daily. 07/09/22  Yes Hoy Register, MD  Blood Glucose Monitoring Suppl (ONETOUCH VERIO) w/Device KIT Use to check blood sugar three times daily Patient taking differently: Use to check blood sugar three times daily. Last Glucose: 186 "about a month ago". 07/09/22  Yes Newlin, Odette Horns, MD  Dulaglutide (TRULICITY) 0.75 MG/0.5ML SOPN Inject 0.75 mg into the skin once a week. 07/09/22  Yes Newlin, Odette Horns, MD  insulin glargine (LANTUS) 100 UNIT/ML injection Inject 26 Units into the skin daily. Last dose: 07-02 am.   Yes [provider]  lisinopril (ZESTRIL) 20 MG tablet Take 1 tablet (20 mg total) by mouth daily. 07/09/22  Yes Hoy Register, MD  metFORMIN (GLUCOPHAGE) 1000 MG tablet Take 1 tablet (1,000 mg total) by mouth 2 (two) times daily with a meal. 07/09/22  Yes Newlin, Enobong, MD  metoprolol tartrate (LOPRESSOR) 50 MG tablet Take 1 tablet (50 mg total) by mouth 2 (two) times daily. 07/09/22  Yes Hoy Register, MD  norgestimate-ethinyl estradiol (ORTHO-CYCLEN) 0.25-35 MG-MCG tablet Take 1 tablet by mouth daily. 07/09/22  Yes Newlin, Odette Horns, MD  ondansetron (ZOFRAN-ODT) 8 MG disintegrating tablet Take 1 tablet (8 mg total) by mouth every 8 (eight) hours as needed for nausea. 07/14/22  Yes Bing Neighbors, NP  albuterol (VENTOLIN HFA) 108 (90 Base) MCG/ACT inhaler Inhale 1-2 puffs into the lungs every 6 (six) hours as needed for wheezing or shortness of breath. 07/09/22   Hoy Register, MD  fluconazole (DIFLUCAN) 150 MG tablet Take 1 tablet (150 mg total) by mouth every 3 (three) days. Patient not taking: Reported on 05/19/2022 04/08/22   Gustavus Bryant, FNP  glucose blood  [facility] VERIO) test strip Use to check blood sugar three times daily 07/09/22   Hoy Register, MD  insulin glargine-yfgn (SEMGLEE) 100 UNIT/ML Pen Inject 26 Units into the skin daily. 07/09/22   Hoy Register, MD  Insulin Pen Needle 32G X 4 MM MISC Use to inject Lantus once daily. 05/21/22   Hoy Register, MD  metroNIDAZOLE (FLAGYL) 500 MG tablet Take 1 tablet (500 mg total) by mouth 2 (two) times daily. Patient not taking: Reported on 05/19/2022 04/09/22   Merrilee Jansky, MD  nystatin cream (MYCOSTATIN) Apply to affected area 2 times daily Patient not taking: Reported on 05/19/2022 04/08/22   Gustavus Bryant, FNP  OneTouch Delica Lancets 33G MISC Use to check blood sugar three times daily 07/09/22   Hoy Register, MD    Family History Family History  Problem Relation Age of Onset   Depression Mother    Diabetes Father    Heart disease Father     Social History Social History   Tobacco Use   Smoking status: Some Days    Packs/day: 0.25    Years: 2.00    Additional pack years: 0.00    Total pack years: 0.50    Types: E-cigarettes, Cigarettes    Last attempt to quit: 05/15/2017    Years since quitting: 5.1   Smokeless tobacco: Never  Vaping Use   Vaping Use: Every day   Substances: Nicotine, Flavoring  Substance Use Topics   Alcohol use: Not Currently    Alcohol/week: 0.0 standard drinks of alcohol    Comment: 1/2 bottle liquor in one day for the week   Drug use: No     Allergies   Bee pollen and Pollen extract   Review of Systems Review of Systems Pertinent negatives listed in HPI  Physical Exam Triage Vital Signs ED Triage Vitals  Enc Vitals Group     BP 07/14/22 0929 126/86     Pulse Rate 07/14/22 0929 88     Resp 07/14/22 0929 18     Temp 07/14/22 0929 98.4 F (36.9 C)     Temp Source 07/14/22 0929 Oral     SpO2 07/14/22 0929 98 %     Weight 07/14/22 0923 251 lb (113.9 kg)     Height 07/14/22 0923 5\' 6"  (1.676 m)     Head Circumference --      Peak  Flow --      Pain Score 07/14/22 0922 0     Pain Loc --      Pain Edu? --      Excl. in GC? --    No data found.  Updated Vital Signs BP 126/86 (BP Location: Left Arm)   Pulse 88   Temp 98.4 F (36.9 C) (Oral)   Resp 18   Ht 5\' 6"  (1.676 m)   Wt 251 lb (113.9 kg)   LMP 07/07/2022 (Approximate)   SpO2 98%   BMI 40.51 kg/m   Visual Acuity Right Eye Distance:   Left Eye Distance:   Bilateral Distance:    Right Eye Near:  Left Eye Near:    Bilateral Near:     Physical Exam Vitals reviewed.  Constitutional:      Appearance: She is obese. She is not ill-appearing, toxic-appearing or diaphoretic.  HENT:     Head: Normocephalic and atraumatic.     Mouth/Throat:     Mouth: Mucous membranes are moist.  Eyes:     Extraocular Movements: Extraocular movements intact.     Conjunctiva/sclera: Conjunctivae normal.     Pupils: Pupils are equal, round, and reactive to light.  Cardiovascular:     Rate and Rhythm: Normal rate and regular rhythm.  Pulmonary:     Effort: Pulmonary effort is normal.     Breath sounds: Normal breath sounds.  Abdominal:     General: Abdomen is protuberant. Bowel sounds are decreased. There is no distension.     Palpations: Abdomen is soft.     Tenderness: There is no abdominal tenderness. There is right CVA tenderness. There is no left CVA tenderness.  Skin:    General: Skin is warm and dry.  Neurological:     General: No focal deficit present.     Mental Status: She is alert.  Psychiatric:        Behavior: Behavior is cooperative.      UC Treatments / Results  Labs (all labs ordered are listed, but only abnormal results are displayed) Labs Reviewed  POCT URINALYSIS DIP (MANUAL ENTRY) - Abnormal; Notable for the following components:      Result Value   Clarity, UA cloudy (*)    Bilirubin, UA moderate (*)    Ketones, POC UA moderate (40) (*)    Spec Grav, UA >=1.030 (*)    Blood, UA large (*)    Protein Ur, POC =30 (*)    Leukocytes,  UA Trace (*)    All other components within normal limits  POCT FASTING CBG [facility] MANUAL ENTRY - Abnormal; Notable for the following components:   POCT Glucose ([facility]) 169 (*)    All other components within normal limits    EKG   Radiology No results found.  Procedures Procedures (including critical care time)  Medications Ordered in UC Medications - No data to display  Initial Impression / Assessment and Plan / UC Course  I have reviewed the triage vital signs and the nursing notes.  Pertinent labs & imaging results that were available during my care of the patient were reviewed by me and considered in my medical decision making (see chart for details).    Suspect send vomiting is related to reaction of both restarting Trulicity and metformin.  Patient advised to hold metformin dose today as she is continue to take despite the nausea and meeting.  To follow-up with primary care doctor to see if they would like for her to reduce dose of metformin.  Encouraged holding the dose today in order to try and tolerate food and reduce nausea.  Also prescribed Zofran and recommended taking consistently at the onset of nausea every 8 hours as needed.  ED precautions given if any of her symptoms become severe.  Patient's blood sugar is stable at 169.  UA consistent with 1 week of nausea with intermittent vomiting.  Nurse to force fluids.  Patient verbalized understanding and agreement with plan. Final Clinical Impressions(s) / UC Diagnoses   Final diagnoses:  Nausea and vomiting, unspecified vomiting type  Type 2 diabetes mellitus with hyperglycemia, with long-term current use of insulin (HCC)  Adverse effect of drug, initial encounter  Discharge Instructions      Follow-up with PC regarding metformin dose and reaction. Today hold Metformin. Included a list of foods that can are diabetic friendly. Protein shakes today ok if unable to tolerate food.      ED Prescriptions     Medication  Sig Dispense Auth. Provider   ondansetron (ZOFRAN-ODT) 8 MG disintegrating tablet Take 1 tablet (8 mg total) by mouth every 8 (eight) hours as needed for nausea. 30 tablet Bing Neighbors, NP      PDMP not reviewed this encounter.   Bing Neighbors, NP 07/14/22 [phone]

## 2022-07-14 NOTE — ED Triage Notes (Signed)
Here for "nausea and unable to keep food down for about 5 days". Vomiting "when trying to eat more than a small amount of anything". Recent medication change with Metformin (stopped, started again). No fever. Voids "fine" (last this am). Stools "haven't had one since about a week ago".

## 2022-08-18 ENCOUNTER — Ambulatory Visit
Admission: EM | Admit: 2022-08-18 | Discharge: 2022-08-18 | Disposition: A | Discharge status: Home / Self Care | Payer: Commercial Managed Care - PPO | Attending: Physician Assistant | Admitting: Physician Assistant

## 2022-08-18 DIAGNOSIS — R220 Localized swelling, mass and lump, head: Secondary | ICD-10-CM | POA: Diagnosis not present

## 2022-08-18 NOTE — ED Provider Notes (Signed)
[name]    CSN: [id number] Arrival date & time: 08/18/22  1746      History   Chief Complaint Chief Complaint  Patient presents with   Facial Swelling    HPI Stacy Wang is a 27 y.o. female.   Patient here today for evaluation of swelling of her lips that occurred earlier today.  She states that there was no trigger that she is aware of her swelling and that eventually swelling spontaneously resolved.  She states that she had difficulty talking due to how significantly swollen her upper lip was.  She has not had any fever.  She denies any nausea or vomiting.  She states she is concerned because a few days ago she also had random swelling of one of her digits.  She notes that swelling again progressed and then resolved spontaneously within a few days.  She denied any injury that would have caused swelling.  She denies any trouble swallowing.  She does not have any shortness of breath.  She does have history of right-sided Bell's palsy but states that the lip swelling started on the left.  The history is provided by the patient.    Past Medical History:  Diagnosis Date   Bell palsy    Bell's palsy    Diabetes mellitus without complication (HCC)    type 2   Hypertension    Migraine     Patient Active Problem List   Diagnosis Date Noted   Sore throat 05/19/2022   Postnasal drip 05/19/2022   Polycystic ovaries 05/19/2022   Irregular periods 05/19/2022   Impaired glucose tolerance 05/19/2022   Borderline personality disorder (HCC) 05/19/2022   Amenorrhea 05/19/2022   DKA (diabetic ketoacidosis) (HCC) 03/31/2022   PCOS (polycystic ovarian syndrome) 04/27/2018   ASCUS with positive high risk HPV cervical 04/27/2018   Morbid obesity (HCC) 04/06/2018   Type 2 diabetes mellitus without complication, without long-term current use of insulin (HCC) 03/15/2018   Essential hypertension, benign 12/21/2017   Tachycardia 12/21/2017   Other abnormal glucose  12/21/2017   Major depressive disorder, recurrent severe without psychotic features (HCC) 05/20/2017   MDD (major depressive disorder), recurrent episode, severe (HCC) 05/20/2017   Hypertension 01/19/2017   Elevated blood-pressure reading without diagnosis of hypertension 10/29/2015   Right-sided Bell's palsy 02/07/2014   Shingles 02/07/2014    Past Surgical History:  Procedure Laterality Date   WISDOM TOOTH EXTRACTION      OB History   No obstetric history on file.      Home Medications    Prior to Admission medications   Medication Sig Start Date End Date Taking? Authorizing Provider  albuterol (VENTOLIN HFA) 108 (90 Base) MCG/ACT inhaler Inhale 1-2 puffs into the lungs every 6 (six) hours as needed for wheezing or shortness of breath. 07/09/22  Yes Newlin, Enobong, MD  atorvastatin (LIPITOR) 40 MG tablet Take 1 tablet (40 mg total) by mouth daily. 07/09/22  Yes Hoy Register, MD  Blood Glucose Monitoring Suppl (ONETOUCH VERIO) w/Device KIT Use to check blood sugar three times daily Patient taking differently: Use to check blood sugar three times daily. Last Glucose: 186 "about a month ago". 07/09/22  Yes Newlin, Odette Horns, MD  Dulaglutide (TRULICITY) 0.75 MG/0.5ML SOPN Inject 0.75 mg into the skin once a week. 07/09/22  Yes Hoy Register, MD  glucose blood (ONETOUCH VERIO) test strip Use to check blood sugar three times daily 07/09/22  Yes Newlin, Enobong, MD  insulin glargine (LANTUS) 100 UNIT/ML injection Inject 26  Units into the skin daily. Last dose: 07-02 am.   Yes [provider]  insulin glargine-yfgn (SEMGLEE) 100 UNIT/ML Pen Inject 26 Units into the skin daily. 07/09/22  Yes Newlin, Odette Horns, MD  Insulin Pen Needle 32G X 4 MM MISC Use to inject Lantus once daily. 05/21/22  Yes Hoy Register, MD  lisinopril (ZESTRIL) 20 MG tablet Take 1 tablet (20 mg total) by mouth daily. 07/09/22  Yes Hoy Register, MD  metFORMIN (GLUCOPHAGE) 1000 MG tablet Take 1 tablet (1,000 mg  total) by mouth 2 (two) times daily with a meal. 07/09/22  Yes Newlin, Enobong, MD  metoprolol tartrate (LOPRESSOR) 50 MG tablet Take 1 tablet (50 mg total) by mouth 2 (two) times daily. 07/09/22  Yes Hoy Register, MD  norgestimate-ethinyl estradiol (ORTHO-CYCLEN) 0.25-35 MG-MCG tablet Take 1 tablet by mouth daily. 07/09/22  Yes Newlin, Odette Horns, MD  ondansetron (ZOFRAN-ODT) 8 MG disintegrating tablet Take 1 tablet (8 mg total) by mouth every 8 (eight) hours as needed for nausea. 07/14/22  Yes Bing Neighbors, NP  OneTouch Delica Lancets 33G MISC Use to check blood sugar three times daily 07/09/22  Yes Newlin, Odette Horns, MD  fluconazole (DIFLUCAN) 150 MG tablet Take 1 tablet (150 mg total) by mouth every 3 (three) days. Patient not taking: Reported on 05/19/2022 04/08/22   Gustavus Bryant, FNP  metroNIDAZOLE (FLAGYL) 500 MG tablet Take 1 tablet (500 mg total) by mouth 2 (two) times daily. Patient not taking: Reported on 05/19/2022 04/09/22   Merrilee Jansky, MD  nystatin cream (MYCOSTATIN) Apply to affected area 2 times daily Patient not taking: Reported on 05/19/2022 04/08/22   Gustavus Bryant, FNP    Family History Family History  Problem Relation Age of Onset   Depression Mother    Diabetes Father    Heart disease Father     Social History Social History   Tobacco Use   Smoking status: Some Days    Current packs/day: 0.00    Average packs/day: 0.3 packs/day for 2.0 years (0.5 ttl pk-yrs)    Types: E-cigarettes, Cigarettes    Start date: 05/16/2015    Last attempt to quit: 05/15/2017    Years since quitting: 5.2   Smokeless tobacco: Never  Vaping Use   Vaping status: Every Day   Substances: Nicotine, Flavoring  Substance Use Topics   Alcohol use: Not Currently    Alcohol/week: 0.0 standard drinks of alcohol    Comment: 1/2 bottle liquor in one day for the week   Drug use: No     Allergies   Bee pollen, Diphenhydramine, and Pollen extract   Review of Systems Review of Systems   Constitutional:  Negative for chills and fever.  HENT:  Positive for facial swelling.   Eyes:  Negative for discharge and redness.  Respiratory:  Positive for shortness of breath.   Gastrointestinal:  Negative for abdominal pain, nausea and vomiting.  Skin:  Negative for color change.     Physical Exam Triage Vital Signs ED Triage Vitals [08/18/22 1847]  Encounter Vitals Group     BP (!) 133/90     Systolic BP Percentile      Diastolic BP Percentile      Pulse Rate 77     Resp 16     Temp 98.9 F (37.2 C)     Temp Source Oral     SpO2 98 %     Weight      Height      Head Circumference  Peak Flow      Pain Score 0     Pain Loc      Pain Education      Exclude from Growth Chart    No data found.  Updated Vital Signs BP (!) 133/90 (BP Location: Left Arm)   Pulse 77   Temp 98.9 F (37.2 C) (Oral)   Resp 16   LMP 07/22/2022 (Approximate)   SpO2 98%    Physical Exam Vitals and nursing note reviewed.  Constitutional:      General: She is not in acute distress.    Appearance: Normal appearance. She is not ill-appearing.  HENT:     Head: Normocephalic and atraumatic.     Nose: No congestion.     Mouth/Throat:     Comments: No current swelling of lips Eyes:     Conjunctiva/sclera: Conjunctivae normal.  Cardiovascular:     Rate and Rhythm: Normal rate.  Pulmonary:     Effort: Pulmonary effort is normal. No respiratory distress.  Neurological:     Mental Status: She is alert.  Psychiatric:        Mood and Affect: Mood normal.        Behavior: Behavior normal.        Thought Content: Thought content normal.      UC Treatments / Results  Labs (all labs ordered are listed, but only abnormal results are displayed) Labs Reviewed  COMPREHENSIVE METABOLIC PANEL  CBC WITH DIFFERENTIAL/PLATELET  TSH    EKG   Radiology No results found.  Procedures Procedures (including critical care time)  Medications Ordered in UC Medications - No data to  display  Initial Impression / Assessment and Plan / UC Course  I have reviewed the triage vital signs and the nursing notes.  Pertinent labs & imaging results that were available during my care of the patient were reviewed by me and considered in my medical decision making (see chart for details).    Unknown cause of symptoms. Will order basic labs and advised follow up with PCP if symptoms continue to occur. Advised to take anti-histamine should symptoms recur. Recommend ED with any further facial swelling, difficulty breathing or swallowing.  Final Clinical Impressions(s) / UC Diagnoses   Final diagnoses:  Facial swelling   Discharge Instructions   None    ED Prescriptions   None    PDMP not reviewed this encounter.   Tomi Bamberger, PA-C 08/18/22 1956

## 2022-08-18 NOTE — ED Triage Notes (Signed)
Pt presents with swelling to face and hands that started 3 weeks ago. Pt states it started with her right ring finger 3 weeks ago and felt tingling feeling then started to swell up to the point of her not being able to bend it then went away the next morning, few days later her right middle finger swelled for 2 days then went away. Then today her lip on left side started swelling and is now starting to go away. Pt doesn't know what is causing the random swelling.

## 2022-08-18 NOTE — Discharge Instructions (Signed)
  Follow up with your PCP regarding symptoms.  Please report to ED with any further facial swelling.

## 2022-08-19 ENCOUNTER — Other Ambulatory Visit: Payer: Self-pay

## 2022-08-20 ENCOUNTER — Other Ambulatory Visit: Payer: Self-pay

## 2022-08-26 ENCOUNTER — Other Ambulatory Visit: Payer: Self-pay

## 2022-09-02 ENCOUNTER — Ambulatory Visit: Payer: Commercial Managed Care - PPO | Admitting: Pharmacist

## 2022-09-28 ENCOUNTER — Encounter: Payer: Self-pay | Admitting: Family Medicine

## 2022-09-28 ENCOUNTER — Ambulatory Visit (INDEPENDENT_AMBULATORY_CARE_PROVIDER_SITE_OTHER): Payer: Commercial Managed Care - PPO | Admitting: Family Medicine

## 2022-09-28 ENCOUNTER — Other Ambulatory Visit (HOSPITAL_COMMUNITY)
Admission: RE | Admit: 2022-09-28 | Discharge: 2022-09-28 | Disposition: A | Discharge status: Home / Self Care | Payer: Commercial Managed Care - PPO | Source: Ambulatory Visit | Attending: Family Medicine | Admitting: Family Medicine

## 2022-09-28 VITALS — BP 137/89 | HR 75 | Temp 98.6°F | Resp 16 | Ht 66.5 in | Wt 247.4 lb

## 2022-09-28 DIAGNOSIS — Z794 Long term (current) use of insulin: Secondary | ICD-10-CM

## 2022-09-28 DIAGNOSIS — Z7984 Long term (current) use of oral hypoglycemic drugs: Secondary | ICD-10-CM | POA: Diagnosis not present

## 2022-09-28 DIAGNOSIS — Z202 Contact with and (suspected) exposure to infections with a predominantly sexual mode of transmission: Secondary | ICD-10-CM | POA: Insufficient documentation

## 2022-09-28 DIAGNOSIS — E1169 Type 2 diabetes mellitus with other specified complication: Secondary | ICD-10-CM

## 2022-09-28 DIAGNOSIS — Z7985 Long-term (current) use of injectable non-insulin antidiabetic drugs: Secondary | ICD-10-CM

## 2022-09-28 LAB — POCT GLYCOSYLATED HEMOGLOBIN (HGB A1C): Hemoglobin A1C: 7.1 % — AB (ref 4.0–5.6)

## 2022-09-29 ENCOUNTER — Encounter: Payer: Self-pay | Admitting: Family Medicine

## 2022-09-29 NOTE — Progress Notes (Signed)
Established Patient Office Visit  Subjective    Patient ID: Stacy Wang, female    DOB: Dec 18, 1995  Age: 27 y.o. MRN: [id number]  CC:  Chief Complaint  Patient presents with   std check    HPI Stacy Wang presents for follow up of chronic med issues. Also desires STD testing.    Outpatient Encounter Medications as of 09/28/2022  Medication Sig   albuterol (VENTOLIN HFA) 108 (90 Base) MCG/ACT inhaler Inhale 1-2 puffs into the lungs every 6 (six) hours as needed for wheezing or shortness of breath.   atorvastatin (LIPITOR) 40 MG tablet Take 1 tablet (40 mg total) by mouth daily.   Blood Glucose Monitoring Suppl (ONETOUCH VERIO) w/Device KIT Use to check blood sugar three times daily (Patient taking differently: Use to check blood sugar three times daily. Last Glucose: 186 "about a month ago".)   Dulaglutide (TRULICITY) 0.75 MG/0.5ML SOPN Inject 0.75 mg into the skin once a week.   fluconazole (DIFLUCAN) 150 MG tablet Take 1 tablet (150 mg total) by mouth every 3 (three) days.   glucose blood (ONETOUCH VERIO) test strip Use to check blood sugar three times daily   insulin glargine (LANTUS) 100 UNIT/ML injection Inject 26 Units into the skin daily. Last dose: 07-02 am.   insulin glargine-yfgn (SEMGLEE) 100 UNIT/ML Pen Inject 26 Units into the skin daily.   Insulin Pen Needle 32G X 4 MM MISC Use to inject Lantus once daily.   lisinopril (ZESTRIL) 20 MG tablet Take 1 tablet (20 mg total) by mouth daily.   metFORMIN (GLUCOPHAGE) 1000 MG tablet Take 1 tablet (1,000 mg total) by mouth 2 (two) times daily with a meal.   metoprolol tartrate (LOPRESSOR) 50 MG tablet Take 1 tablet (50 mg total) by mouth 2 (two) times daily.   metroNIDAZOLE (FLAGYL) 500 MG tablet Take 1 tablet (500 mg total) by mouth 2 (two) times daily.   norgestimate-ethinyl estradiol (ORTHO-CYCLEN) 0.25-35 MG-MCG tablet Take 1 tablet by mouth daily.   nystatin cream (MYCOSTATIN) Apply to affected area 2 times daily    ondansetron (ZOFRAN-ODT) 8 MG disintegrating tablet Take 1 tablet (8 mg total) by mouth every 8 (eight) hours as needed for nausea.   OneTouch Delica Lancets 33G MISC Use to check blood sugar three times daily   No facility-administered encounter medications on file as of 09/28/2022.    Past Medical History:  Diagnosis Date   Bell palsy    Bell's palsy    Diabetes mellitus without complication (HCC)    type 2   Hypertension    Migraine     Past Surgical History:  Procedure Laterality Date   WISDOM TOOTH EXTRACTION      Family History  Problem Relation Age of Onset   Depression Mother    Diabetes Father    Heart disease Father     Social History   Socioeconomic History   Marital status: Single    Spouse name: Not on file   Number of children: 0   Years of education: Some colle   Highest education level: Not on file  Occupational History   Occupation: Personal assistant - customer service  Tobacco Use   Smoking status: Some Days    Current packs/day: 0.00    Average packs/day: 0.3 packs/day for 2.0 years (0.5 ttl pk-yrs)    Types: E-cigarettes, Cigarettes    Start date: 05/16/2015    Last attempt to quit: 05/15/2017    Years since quitting: 5.3   Smokeless tobacco: Never  Vaping Use   Vaping status: Every Day   Substances: Nicotine, Flavoring  Substance and Sexual Activity   Alcohol use: Not Currently    Alcohol/week: 0.0 standard drinks of alcohol    Comment: 1/2 bottle liquor in one day for the week   Drug use: No   Sexual activity: Not Currently  Other Topics Concern   Not on file  Social History Narrative   Right handed.   Lives at home with roommates.   Caffeine occasionally - not daily.   Social Determinants of Health   Financial Resource Strain: High Risk (09/28/2022)   Overall Financial Resource Strain (CARDIA)    Difficulty of Paying Living Expenses: Hard  Food Insecurity: Food Insecurity Present (09/28/2022)   Hunger Vital Sign    Worried About Running Out  of Food in the Last Year: Sometimes true    Ran Out of Food in the Last Year: Sometimes true  Transportation Needs: No Transportation Needs (09/28/2022)   PRAPARE - Administrator, Civil Service (Medical): No    Lack of Transportation (Non-Medical): No  Physical Activity: Inactive (09/28/2022)   Exercise Vital Sign    Days of Exercise per Week: 0 days    Minutes of Exercise per Session: 0 min  Stress: Stress Concern Present (09/28/2022)   [employer] of Occupational Health - Occupational Stress Questionnaire    Feeling of Stress : Very much  Social Connections: Socially Isolated (09/28/2022)   Social Connection and Isolation Panel [NHANES]    Frequency of Communication with Friends and Family: More than three times a week    Frequency of Social Gatherings with Friends and Family: Once a week    Attends Religious Services: Never    Database administrator or Organizations: No    Attends Banker Meetings: Never    Marital Status: Never married  Intimate Partner Violence: Not At Risk (09/28/2022)   Humiliation, Afraid, Rape, and Kick questionnaire    Fear of Current or Ex-Partner: No    Emotionally Abused: No    Physically Abused: No    Sexually Abused: No    Review of Systems  All other systems reviewed and are negative.       Objective    BP 137/89 (BP Location: Right Arm, Patient Position: Sitting, Cuff Size: Large)   Pulse 75   Temp 98.6 F (37 C) (Oral)   Resp 16   Ht 5' 6.5" (1.689 m)   Wt 247 lb 6.4 oz (112.2 kg)   SpO2 97%   BMI 39.33 kg/m   Physical Exam Vitals and nursing note reviewed.  Constitutional:      General: She is not in acute distress. Cardiovascular:     Rate and Rhythm: Normal rate and regular rhythm.  Pulmonary:     Effort: Pulmonary effort is normal.     Breath sounds: Normal breath sounds.  Abdominal:     Palpations: Abdomen is soft.     Tenderness: There is no abdominal tenderness.  Neurological:      General: No focal deficit present.     Mental Status: She is alert and oriented to person, place, and time.         Assessment & Plan:   1. Type 2 diabetes mellitus with other specified complication, with long-term current use of insulin (HCC) Much improved A1c and just above goal.  - POCT glycosylated hemoglobin (Hb A1C)  2. Possible exposure to STD  - Cervicovaginal ancillary only  No follow-ups on file.   Tommie Raymond, MD

## 2022-10-04 NOTE — Progress Notes (Unsigned)
S:     No chief complaint on file.  27 y.o. female who presents for diabetes evaluation, education, and management. PMH is significant for T2DM, hx DKA, HTN, PCOS, MDD, obesity.   Patient was referred and last seen by Primary Care Provider, Dr. Andrey Campanile, on 09/28/22. Patient was last seen by [facility] on 07/09/22  When [facility] saw her on 07/09/22, she restarted Trulicity 0.75 after being out for 2 weeks, Semglee 26 units was continued. Between then and her visit w/ Dr. Andrey Campanile on 09/28/22, she had 2 ED trips for n/v (related to restarting Trulicity & Metformin) on 07/14/22 & facial swelling on 08/18/22. A1c w/ Dr. Andrey Campanile on 09/28/22 down to 7.1% from 15% 4 months ago!  Regarding her HTN, BP was 137/89 w/ on 09/28/22, has remained at or close to goal since 03/2022. Takes lisinopril 20 mg daily and metoprolol tartrate 50 mg BID.   Today, Patient arrives in *** good spirits and presents without *** any assistance. ***Patient is accompanied by ***.   Patient reports Diabetes was diagnosed in 2020.    Family/Social History:  -Fhx: depression, DM, heart disease -Tobacco: does not smoke cigarettes - vapes.  -Alcohol: none since hospitalization  Current diabetes medications include: insulin glargine 26 units daily, Trulicity 0.75 mg weekly Current hypertension medications include: lisinopril 20 mg daily, metoprolol tartrate 50 mg BID Current hyperlipidemia medications include: atorvastatin 40 mg daily  Patient reports adherence to taking all medications as prescribed.  *** Patient denies adherence with medications, reports missing *** medications *** times per week, on average.  Do you feel that your medications are working for you? {YES NO:22349} Have you been experiencing any side effects to the medications prescribed? {YES NO:22349} Do you have any problems obtaining medications due to transportation or finances? {YES NO:22349} Insurance coverage: UHC/ [place] MEDICAID  Patient {Actions;  denies-reports:120008} hypoglycemic events.  Reported home fasting blood sugars: ***  Reported 2 hour post-meal/random blood sugars: ***.  Patient {Actions; denies-reports:120008} nocturia (nighttime urination).  Patient {Actions; denies-reports:120008} neuropathy (nerve pain). Patient {Actions; denies-reports:120008} visual changes. Patient {Actions; denies-reports:120008} self foot exams.   Patient reported dietary habits: Eats 3 meals/day Breakfast: fruit (berries), protein shake, keto cereal w/ 2% milk, oatmeal on other days Lunch: varies - will sometimes not get a lunch. Salad w/ chips. Dinner: largest meal (rice and/or potatoes), salad, protein  Drinks: water, Simply (Lite) Lemonade   Patient-reported exercise habits: ***  O:   ROS  Physical Exam  7 day average blood glucose: ***  Libre3 *** CGM Download today *** on *** % Time CGM is active: ***% Average Glucose: *** mg/dL Glucose Management Indicator: ***  Glucose Variability: ***% (goal <36%) Time in Goal:  - Time in range 70-180: ***% - Time above range: ***% - Time below range: ***% Observed patterns:   Lab Results  Component Value Date   HGBA1C 7.1 (A) 09/28/2022   There were no vitals filed for this visit.  Lipid Panel     Component Value Date/Time   CHOL 183 06/24/2022 1037   TRIG 93 06/24/2022 1037   HDL 44 06/24/2022 1037   CHOLHDL 4.2 06/24/2022 1037   CHOLHDL 5.3 05/21/2017 0648   VLDL 22 05/21/2017 0648   LDLCALC 122 (H) 06/24/2022 1037    Clinical Atherosclerotic Cardiovascular Disease (ASCVD): No  The ASCVD Risk score (Arnett DK, et al., 2019) failed to calculate for the following reasons:   The 2019 ASCVD risk score is only valid for ages 16 to 16  Patient is participating in a Managed Medicaid Plan:  {MM YES/NO:27447::"Yes"}   A/P: Diabetes longstanding *** currently ***. Patient is *** able to verbalize appropriate hypoglycemia management plan. Medication adherence appears ***.  Control is suboptimal due to ***. -{Meds adjust:18428} basal insulin *** Lantus/Basaglar/Semglee (insulin glargine) *** Tresiba (insulin degludec) from *** units to *** units daily in the morning. Patient will continue to titrate 1 unit every *** days if fasting blood sugar > 100mg /dl until fasting blood sugars reach goal or next visit.  -{Meds adjust:18428} rapid insulin *** Novolog (insulin aspart) *** Humalog (insulin lispro) from *** to ***.  -{Meds adjust:18428} GLP-1 *** Trulicity (dulaglutide) *** Ozempic (semaglutide) *** Mounjaro (tirzepatide) from *** mg to *** mg .  -{Meds adjust:18428} SGLT2-I *** Farxiga (dapagliflozin) *** Jardiance (empagliflozin) 10 mg. Counseled on sick day rules. -{Meds adjust:18428} metformin ***.  -Patient educated on purpose, proper use, and potential adverse effects of ***.  -Extensively discussed pathophysiology of diabetes, recommended lifestyle interventions, dietary effects on blood sugar control.  -Counseled on s/sx of and management of hypoglycemia.  -Next A1c anticipated ***.   ASCVD risk - primary prevention in patient with diabetes. Last LDL is 122 not at goal of <161 mg/dL. moderate intensity statin indicated.  -Started ***statin *** mg.   Hypertension longstanding *** currently ***. Blood pressure goal of <130/80 *** mmHg. Medication adherence ***. Blood pressure control is suboptimal due to ***. -{Meds adjust:18428} *** mg.  Written patient instructions provided. Patient verbalized understanding of treatment plan.  Total time in face to face counseling *** minutes.    Follow-up:  Pharmacist *** PCP clinic visit in *** Patient seen with ***

## 2022-10-05 ENCOUNTER — Ambulatory Visit: Payer: Commercial Managed Care - PPO | Attending: Family Medicine | Admitting: Pharmacist

## 2022-10-05 ENCOUNTER — Other Ambulatory Visit: Payer: Self-pay

## 2022-10-05 ENCOUNTER — Encounter: Payer: Self-pay | Admitting: Pharmacist

## 2022-10-05 VITALS — BP 139/100 | HR 105

## 2022-10-05 DIAGNOSIS — Z7984 Long term (current) use of oral hypoglycemic drugs: Secondary | ICD-10-CM

## 2022-10-05 DIAGNOSIS — E119 Type 2 diabetes mellitus without complications: Secondary | ICD-10-CM | POA: Diagnosis not present

## 2022-10-05 DIAGNOSIS — Z794 Long term (current) use of insulin: Secondary | ICD-10-CM

## 2022-10-05 DIAGNOSIS — Z7985 Long-term (current) use of injectable non-insulin antidiabetic drugs: Secondary | ICD-10-CM

## 2022-10-05 MED ORDER — FREESTYLE LIBRE 3 READER DEVI
0 refills | Status: DC
  Filled 2022-10-05: qty 1, 30d supply, fill #0

## 2022-10-05 MED ORDER — INSULIN GLARGINE-YFGN 100 UNIT/ML ~~LOC~~ SOPN
10.0000 [IU] | PEN_INJECTOR | Freq: Every day | SUBCUTANEOUS | 2 refills | Status: DC
  Filled 2022-10-05: qty 3, 30d supply, fill #0

## 2022-10-05 MED ORDER — METOPROLOL SUCCINATE ER 50 MG PO TB24
50.0000 mg | ORAL_TABLET | Freq: Every day | ORAL | 3 refills | Status: DC
  Filled 2022-10-05: qty 90, 90d supply, fill #0
  Filled 2023-01-18: qty 90, 90d supply, fill #1
  Filled 2023-04-15: qty 90, 90d supply, fill #2

## 2022-10-05 MED ORDER — ATORVASTATIN CALCIUM 40 MG PO TABS
40.0000 mg | ORAL_TABLET | Freq: Every day | ORAL | 1 refills | Status: DC
  Filled 2022-10-05: qty 90, 90d supply, fill #0

## 2022-10-05 MED ORDER — FREESTYLE LIBRE 3 SENSOR MISC
2 refills | Status: DC
  Filled 2022-10-05: qty 2, 28d supply, fill #0

## 2022-10-05 MED ORDER — METOPROLOL SUCCINATE ER 100 MG PO TB24
100.0000 mg | ORAL_TABLET | Freq: Every day | ORAL | 1 refills | Status: DC
  Filled 2022-10-05: qty 90, 90d supply, fill #0

## 2022-10-05 MED ORDER — TRULICITY 0.75 MG/0.5ML ~~LOC~~ SOAJ
0.7500 mg | SUBCUTANEOUS | 1 refills | Status: DC
Start: 2022-10-05 — End: 2022-11-05
  Filled 2022-10-05: qty 2, 28d supply, fill #0

## 2022-10-05 MED ORDER — LISINOPRIL 20 MG PO TABS
20.0000 mg | ORAL_TABLET | Freq: Every day | ORAL | 1 refills | Status: DC
  Filled 2022-10-05: qty 90, 90d supply, fill #0

## 2022-10-05 MED ORDER — METFORMIN HCL ER 500 MG PO TB24
1000.0000 mg | ORAL_TABLET | Freq: Every day | ORAL | 1 refills | Status: DC
  Filled 2022-10-05: qty 180, 90d supply, fill #0

## 2022-10-05 MED ORDER — TRULICITY 0.75 MG/0.5ML ~~LOC~~ SOAJ
0.7500 mg | SUBCUTANEOUS | 1 refills | Status: DC
Start: 2022-10-05 — End: 2022-10-05
  Filled 2022-10-05: qty 6, 84d supply, fill #0

## 2022-10-06 ENCOUNTER — Other Ambulatory Visit: Payer: Self-pay

## 2022-10-06 LAB — LIPID PANEL
Chol/HDL Ratio: 2.7 ratio (ref 0.0–4.4)
Cholesterol, Total: 112 mg/dL (ref 100–199)
HDL: 41 mg/dL (ref >39)
LDL Chol Calc (NIH): 48 mg/dL (ref 0–99)
Triglycerides: 130 mg/dL (ref 0–149)
VLDL Cholesterol Cal: 23 mg/dL (ref 5–40)

## 2022-10-07 ENCOUNTER — Other Ambulatory Visit: Payer: Self-pay

## 2022-10-08 ENCOUNTER — Other Ambulatory Visit: Payer: Self-pay

## 2022-10-11 ENCOUNTER — Other Ambulatory Visit: Payer: Self-pay

## 2022-10-12 ENCOUNTER — Other Ambulatory Visit: Payer: Self-pay

## 2022-10-14 ENCOUNTER — Other Ambulatory Visit: Payer: Self-pay

## 2022-10-14 ENCOUNTER — Other Ambulatory Visit: Payer: Self-pay | Admitting: Pharmacist

## 2022-10-14 DIAGNOSIS — E119 Type 2 diabetes mellitus without complications: Secondary | ICD-10-CM

## 2022-10-14 MED ORDER — DEXCOM G7 SENSOR MISC
6 refills | Status: DC
Start: 2022-10-14 — End: 2023-06-21
  Filled 2022-10-14: qty 3, 30d supply, fill #0
  Filled 2022-11-05 – 2022-11-08 (×2): qty 3, 30d supply, fill #1
  Filled 2022-12-14: qty 3, 30d supply, fill #2
  Filled 2023-01-13: qty 3, 30d supply, fill #3
  Filled 2023-02-14: qty 3, 30d supply, fill #4
  Filled 2023-03-08 – 2023-03-16 (×2): qty 3, 30d supply, fill #5
  Filled 2023-04-15 – 2023-06-17 (×4): qty 3, 30d supply, fill #6

## 2022-10-14 MED ORDER — DEXCOM G7 RECEIVER DEVI
0 refills | Status: DC
Start: 2022-10-14 — End: 2023-06-21
  Filled 2022-10-14: qty 1, 30d supply, fill #0

## 2022-10-15 ENCOUNTER — Other Ambulatory Visit: Payer: Self-pay

## 2022-11-05 ENCOUNTER — Ambulatory Visit: Payer: Commercial Managed Care - PPO | Attending: Nurse Practitioner | Admitting: Pharmacist

## 2022-11-05 ENCOUNTER — Encounter: Payer: Self-pay | Admitting: Pharmacist

## 2022-11-05 ENCOUNTER — Other Ambulatory Visit: Payer: Self-pay

## 2022-11-05 VITALS — BP 112/78 | HR 78

## 2022-11-05 DIAGNOSIS — I10 Essential (primary) hypertension: Secondary | ICD-10-CM

## 2022-11-05 DIAGNOSIS — Z7985 Long-term (current) use of injectable non-insulin antidiabetic drugs: Secondary | ICD-10-CM

## 2022-11-05 DIAGNOSIS — Z794 Long term (current) use of insulin: Secondary | ICD-10-CM | POA: Diagnosis not present

## 2022-11-05 DIAGNOSIS — E119 Type 2 diabetes mellitus without complications: Secondary | ICD-10-CM | POA: Diagnosis not present

## 2022-11-05 MED ORDER — TRULICITY 1.5 MG/0.5ML ~~LOC~~ SOAJ
1.5000 mg | SUBCUTANEOUS | 1 refills | Status: DC
  Filled 2022-11-05: qty 2, 28d supply, fill #0
  Filled 2022-12-06: qty 2, 28d supply, fill #1
  Filled 2023-01-10: qty 2, 28d supply, fill #2
  Filled 2023-02-06: qty 2, 28d supply, fill #3
  Filled 2023-03-08: qty 2, 28d supply, fill #4
  Filled 2023-04-02: qty 2, 28d supply, fill #5

## 2022-11-05 NOTE — Progress Notes (Signed)
S:     No chief complaint on file.  27 y.o. female who presents for diabetes evaluation, education, and management. PMH is significant for T2DM, hx DKA, HTN, PCOS, MDD, obesity.   Patient was referred and last seen by Primary Care Provider, Dr. Andrey Campanile, on 09/28/22. Patient was last seen by pharmacy on 10/05/2022. At that visit, we changed her metoprolol to the succinate form for improved adherence. Additionally, we restarted Trulicity, reduced her Semglee dose, and rechecked her lipids. Of note, she takes high intensity statin therapy and her LDL was 48 at that visit!   Today, Patient arrives in good spirits and presents without any assistance. Patient reports doing well since last visit. She had to stop metformin d/t nausea but continues to take Trulicity and tolerate this well. She denies any NV or abdominal pain. She takes Semglee sparingly because of her improved home blood sugar control.   Family/Social History:  -Fhx: depression, DM, heart disease -Tobacco: does not smoke cigarettes - vapes.  -Alcohol: none since hospitalization  Current diabetes medications include: insulin glargine 10 units daily (takes only sparingly), Trulicity 0.75 mg weekly, metformin 1000 mg BID (stopped d/t nausea) Current hypertension medications include: lisinopril 20 mg daily, metoprolol succinate 50 mg daily Current hyperlipidemia medications include: atorvastatin 40 mg daily  Patient reports adherence to Trulicity. Only takes semglee sparingly and had to stop metformin d/t nausea. Is adherent to lisinopril and metoprolol. Took both this morning. Lastly, she is taking atorvastatin daily.   Insurance coverage: UHC/ [place] MEDICAID  Patient denies hypoglycemic events.  Patient denies nocturia (nighttime urination). Patient denies neuropathy (nerve pain). Patient reports visual changes. Needs eye dr. Referral, hasn't seen one in 4 months Patient reports self foot exams.   Patient reported dietary habits:  Eats 3 meals/day Breakfast: fruit (berries), protein shake, keto cereal w/ 2% milk, oatmeal on other days Lunch: varies - will sometimes not get a lunch. Salad w/ chips. Dinner: largest meal (rice and/or potatoes), salad, protein  Drinks: water, Simply (Lite) Lemonade   O:  Dexcom G7:  Avg glucose: 152 mg/dL Time in range: 32% Avg: 17% above range   Lab Results  Component Value Date   HGBA1C 7.1 (A) 09/28/2022   Vitals:   11/05/22 0913  BP: 112/78  Pulse: 78    Lipid Panel     Component Value Date/Time   CHOL 112 10/05/2022 1020   TRIG 130 10/05/2022 1020   HDL 41 10/05/2022 1020   CHOLHDL 2.7 10/05/2022 1020   CHOLHDL 5.3 05/21/2017 0648   VLDL 22 05/21/2017 0648   LDLCALC 48 10/05/2022 1020    Clinical Atherosclerotic Cardiovascular Disease (ASCVD): No  The ASCVD Risk score (Arnett DK, et al., 2019) failed to calculate for the following reasons:   The 2019 ASCVD risk score is only valid for ages 34 to 51   Patient is participating in a Managed Medicaid Plan: No   A/P: Diabetes longstanding currently close to goal, much improved in past 4 months! Commended her for this. Patient is able to verbalize appropriate hypoglycemia management plan. Medication adherence summarized above. We will increase Trulicity and stop insulin to see how she does with control on Trulicity alone. Metformin discontinued d/t nausea. Home CGM report looks fantastic!  -Discontinued Semglee.  -Increase Trulicity (dulaglutide) to 1.5 mg weekly.  -Discontinue metformin d/t nausea.  -Placed referral for an Eye Dr. Alfonzo Beers. Cosign needed from Dr. Andrey Campanile. -Patient educated on purpose, proper use, and potential adverse effects of Trulicity. -Extensively  discussed pathophysiology of diabetes, recommended lifestyle interventions, dietary effects on blood sugar control.  -Counseled on s/sx of and management of hypoglycemia.  -Next A1c anticipated 12/2022.   ASCVD risk - primary prevention in patient with  diabetes. Last LDL is 48 mg/dL which is wonderful! Commended her for this!  -Continued atorvastatin 40 mg daily  Hypertension longstanding currently well-controlled with switch to succinate form of metoprolol. Continue this and lisinopril. BP goal <130/80 mmHg. -Continued lisinopril 20 mg daily -Continued metoprolol succinate (XL) 50 mg daily  Written patient instructions provided. Patient verbalized understanding of treatment plan.  Total time in face to face counseling 45 minutes.    Follow-up:  Pharmacist in 1 month PCP clinic visit in 3 months Patient seen with Rosina Lowenstein, PharmD Candidate [facility]

## 2022-11-08 ENCOUNTER — Other Ambulatory Visit: Payer: Self-pay

## 2022-11-09 ENCOUNTER — Telehealth: Payer: Self-pay | Admitting: Family Medicine

## 2022-11-09 ENCOUNTER — Other Ambulatory Visit: Payer: Self-pay

## 2022-11-09 NOTE — Telephone Encounter (Signed)
Called pt and could not reach or leave vm to schedule appt requested via MyChart for possible arthritis or neuropathy on fingers

## 2022-11-17 ENCOUNTER — Ambulatory Visit
Admission: EM | Admit: 2022-11-17 | Discharge: 2022-11-17 | Disposition: A | Discharge status: Home / Self Care | Payer: Commercial Managed Care - PPO

## 2022-12-05 NOTE — Progress Notes (Signed)
S:     No chief complaint on file.  27 y.o. female who presents for diabetes evaluation, education, and management. PMH is significant for T2DM, hx DKA, HTN, PCOS, MDD, obesity.   Patient was referred and last seen by Primary Care Provider, Dr. Andrey Campanile, on 09/28/22. At that visit A1c was 7.1%. Patient was last seen by pharmacy on 11/05/2022. At that visit patient reported doing well. She mentioned that she had to stop metformin d/t nausea but continued to take Trulicity and was tolerating it well. She denied any NV or abdominal pain. She reported taking Semglee sparingly because of her improved home blood sugar control. Patient also reported visual changes at last visit and referral was placed. Semglee and metformin (nausea) was d/c'd at that visit.   Today, patient arrives in good spirits and presents without any assistance. Patient states she is tolerating Trulicity well. Patient mentions she initially had some nausea and abdominal pain but that is now resolved. Patient mentions she is having appetite suppression and feels full faster, however this is currently not a problem for her. Patient mentions weight loss is not a priority for her she just wants to have good glycemic control with the ability to eat even if its smaller meals. Patient mentions this past Friday her dexcom at night started beeping and showed a blood sugar of 43 but she does say she did not eat enough food that day which is probably why that happened. Patient does not have dexcom data with her today because she forgot to bring it but she does mention her average glucose was in the 150s, time in range around 80%. Patient mentions she needs refills on her trulicity and dexcom sensors.   Patient reports Diabetes was diagnosed in 2021.   Family/Social History:  -Fhx: depression, DM, heart disease -Tobacco: does not smoke cigarettes - vapes.  -Alcohol: none since hospitalization  Current diabetes medications include: Trulicity  1.5 mg weekly Current hypertension medications include: lisinopril 20 mg daily, metoprolol succinate 50 mg daily Current hyperlipidemia medications include: atorvastatin 40 mg daily  Patient reports adherence to taking all medications as prescribed.   Insurance coverage: UHC /  Medicaid  Patient reports hypoglycemic events.  Reported average blood sugars: 150s   Patient denies nocturia (nighttime urination).  Patient denies neuropathy (nerve pain). Mentions got better than before. Patient reports visual changes. Patient reports self foot exams.   Patient reported dietary habits: Eats 3 meals/day Breakfast: fruit (berries), protein shake, keto cereal w/ 2% milk, oatmeal on other days Lunch: varies - will sometimes not get a lunch. Salad w/ chips. Dinner: largest meal (rice and/or potatoes), salad, protein  Drinks: water, Simply (Lite) Lemonade  Patient-reported exercise habits: walks sometimes  O:   Dexcom G7: forgot to bring data with her today   Lab Results  Component Value Date   HGBA1C 7.1 (A) 09/28/2022   There were no vitals filed for this visit.  Lipid Panel     Component Value Date/Time   CHOL 112 10/05/2022 1020   TRIG 130 10/05/2022 1020   HDL 41 10/05/2022 1020   CHOLHDL 2.7 10/05/2022 1020   CHOLHDL 5.3 05/21/2017 0648   VLDL 22 05/21/2017 0648   LDLCALC 48 10/05/2022 1020    Clinical Atherosclerotic Cardiovascular Disease (ASCVD): No  The ASCVD Risk score (Arnett DK, et al., 2019) failed to calculate for the following reasons:   The 2019 ASCVD risk score is only valid for ages 62 to 89   A/P:  Diabetes longstanding currently close to goal based on A1c of 7.1%. Patient is  able to verbalize appropriate hypoglycemia management plan. Medication adherence appears optimal. Control seems optimal. Patient mentioned she had one event of Hypoglycemia which was due to not eating enough. Patient does not have Dexcom G7 data today. Weight loss is not a priority  for the patient. Good glycemic control is the priority for the patient. -Continued GLP-1 Trulicity (dulaglutide) at 1.5 mg once weekly.  -Patient educated on purpose, proper use, and potential adverse effects of medications.  -Extensively discussed pathophysiology of diabetes, recommended lifestyle interventions, dietary effects on blood sugar control.  -Counseled on s/sx of and management of hypoglycemia.  -Next A1c anticipated 12/2022.   ASCVD risk - primary secondary prevention in patient with diabetes. Last LDL was 48 mg/dL at goal of <66 mg/dL.   -Continued Atorvastatin 40 mg.   Hypertension longstanding currently well-controlled with switch to succinate form of metoprolol. Blood pressure goal of <130/80  mmHg. -Continued lisinopril 20 mg daily. -Continued metoprolol succinate (XL) 50 mg daily.  Written patient instructions provided. Patient verbalized understanding of treatment plan.  Total time in face to face counseling 30 minutes.    Follow-up:  Pharmacist: 01/13/2023 PCP clinic visit: 12/31/2022  Patient seen with: Erasmo Leventhal, PharmD Candidate  Class of 2025 [employer] [facility] SOP   Butch Penny, PharmD, [place], CPP Clinical Pharmacist [facility] [phone]

## 2022-12-06 ENCOUNTER — Other Ambulatory Visit: Payer: Self-pay

## 2022-12-06 ENCOUNTER — Encounter: Payer: Self-pay | Admitting: Pharmacist

## 2022-12-06 ENCOUNTER — Ambulatory Visit: Payer: Commercial Managed Care - PPO | Attending: Family Medicine | Admitting: Pharmacist

## 2022-12-06 DIAGNOSIS — Z7985 Long-term (current) use of injectable non-insulin antidiabetic drugs: Secondary | ICD-10-CM

## 2022-12-06 DIAGNOSIS — E119 Type 2 diabetes mellitus without complications: Secondary | ICD-10-CM

## 2022-12-06 DIAGNOSIS — Z794 Long term (current) use of insulin: Secondary | ICD-10-CM | POA: Diagnosis not present

## 2022-12-14 ENCOUNTER — Other Ambulatory Visit: Payer: Self-pay

## 2022-12-22 ENCOUNTER — Other Ambulatory Visit: Payer: Self-pay | Admitting: Family Medicine

## 2022-12-31 ENCOUNTER — Ambulatory Visit (INDEPENDENT_AMBULATORY_CARE_PROVIDER_SITE_OTHER): Payer: Commercial Managed Care - PPO | Admitting: Family Medicine

## 2022-12-31 VITALS — BP 119/85 | HR 86 | Temp 98.6°F | Resp 16 | Wt 246.2 lb

## 2022-12-31 DIAGNOSIS — I10 Essential (primary) hypertension: Secondary | ICD-10-CM | POA: Diagnosis not present

## 2022-12-31 DIAGNOSIS — E785 Hyperlipidemia, unspecified: Secondary | ICD-10-CM | POA: Diagnosis not present

## 2022-12-31 DIAGNOSIS — E1169 Type 2 diabetes mellitus with other specified complication: Secondary | ICD-10-CM

## 2022-12-31 DIAGNOSIS — Z3041 Encounter for surveillance of contraceptive pills: Secondary | ICD-10-CM | POA: Diagnosis not present

## 2022-12-31 DIAGNOSIS — Z794 Long term (current) use of insulin: Secondary | ICD-10-CM

## 2022-12-31 LAB — POCT GLYCOSYLATED HEMOGLOBIN (HGB A1C): Hemoglobin A1C: 7.3 % — AB (ref 4.0–5.6)

## 2022-12-31 NOTE — Progress Notes (Unsigned)
Patient is here for their 3 month follow-up Patient has no concerns today Care gaps have been discussed with patient  

## 2023-01-03 ENCOUNTER — Encounter: Payer: Self-pay | Admitting: Family Medicine

## 2023-01-03 MED ORDER — NORGESTIMATE-ETH ESTRADIOL 0.25-35 MG-MCG PO TABS: 1.0000 | ORAL_TABLET | Freq: Every day | ORAL | 1 refills | Status: DC

## 2023-01-03 NOTE — Progress Notes (Signed)
Established Patient Office Visit  Subjective    Patient ID: Stacy Wang, female    DOB: August 04, 1995  Age: 27 y.o. MRN: [id number]  CC:  Chief Complaint  Patient presents with   Medical Management of Chronic Issues    HPI Stacy Wang presents for routine follow up of chronic med issues including diabetes and hypertension. She als requests refill of birth control. She denies acute complaints.   Outpatient Encounter Medications as of 12/31/2022  Medication Sig   albuterol (VENTOLIN HFA) 108 (90 Base) MCG/ACT inhaler Inhale 1-2 puffs into the lungs every 6 (six) hours as needed for wheezing or shortness of breath.   atorvastatin (LIPITOR) 40 MG tablet TAKE 1 TABLET(40 MG) BY MOUTH DAILY   Continuous Glucose Receiver (DEXCOM G7 RECEIVER) DEVI Use to check blood glucose continuously.   Continuous Glucose Sensor (DEXCOM G7 SENSOR) MISC Use to check blood glucose throughout the day. Changes sensors once every 10 days.   Dulaglutide (TRULICITY) 1.5 MG/0.5ML SOAJ Inject 1.5 mg into the skin once a week.   lisinopril (ZESTRIL) 20 MG tablet TAKE 1 TABLET(20 MG) BY MOUTH DAILY   metFORMIN (GLUCOPHAGE) 1000 MG tablet TAKE 1 TABLET(1000 MG) BY MOUTH TWICE DAILY WITH A MEAL   metoprolol succinate (TOPROL-XL) 50 MG 24 hr tablet Take 1 tablet (50 mg total) by mouth daily. Take with or immediately following a meal.   norgestimate-ethinyl estradiol (ORTHO-CYCLEN) 0.25-35 MG-MCG tablet Take 1 tablet by mouth daily.   [DISCONTINUED] norgestimate-ethinyl estradiol (ORTHO-CYCLEN) 0.25-35 MG-MCG tablet Take 1 tablet by mouth daily.   No facility-administered encounter medications on file as of 12/31/2022.    Past Medical History:  Diagnosis Date   Bell palsy    Bell's palsy    Diabetes mellitus without complication (HCC)    type 2   Hypertension    Migraine     Past Surgical History:  Procedure Laterality Date   WISDOM TOOTH EXTRACTION      Family History  Problem Relation Age of  Onset   Depression Mother    Diabetes Father    Heart disease Father     Social History   Socioeconomic History   Marital status: Single    Spouse name: Not on file   Number of children: 0   Years of education: Some colle   Highest education level: Not on file  Occupational History   Occupation: Personal assistant - customer service  Tobacco Use   Smoking status: Some Days    Current packs/day: 0.00    Average packs/day: 0.3 packs/day for 2.0 years (0.5 ttl pk-yrs)    Types: E-cigarettes, Cigarettes    Start date: 05/16/2015    Last attempt to quit: 05/15/2017    Years since quitting: 5.6   Smokeless tobacco: Never  Vaping Use   Vaping status: Every Day   Substances: Nicotine, Flavoring  Substance and Sexual Activity   Alcohol use: Not Currently    Alcohol/week: 0.0 standard drinks of alcohol    Comment: 1/2 bottle liquor in one day for the week   Drug use: No   Sexual activity: Not Currently  Other Topics Concern   Not on file  Social History Narrative   Right handed.   Lives at home with roommates.   Caffeine occasionally - not daily.   Social Drivers of Health   Financial Resource Strain: High Risk (09/28/2022)   Overall Financial Resource Strain (CARDIA)    Difficulty of Paying Living Expenses: Hard  Food Insecurity: Food Insecurity Present (09/28/2022)  Hunger Vital Sign    Worried About Running Out of Food in the Last Year: Sometimes true    Ran Out of Food in the Last Year: Sometimes true  Transportation Needs: No Transportation Needs (09/28/2022)   PRAPARE - Administrator, Civil Service (Medical): No    Lack of Transportation (Non-Medical): No  Physical Activity: Inactive (09/28/2022)   Exercise Vital Sign    Days of Exercise per Week: 0 days    Minutes of Exercise per Session: 0 min  Stress: Stress Concern Present (09/28/2022)   [employer] of Occupational Health - Occupational Stress Questionnaire    Feeling of Stress : Very much  Social  Connections: Socially Isolated (09/28/2022)   Social Connection and Isolation Panel [NHANES]    Frequency of Communication with Friends and Family: More than three times a week    Frequency of Social Gatherings with Friends and Family: Once a week    Attends Religious Services: Never    Database administrator or Organizations: No    Attends Banker Meetings: Never    Marital Status: Never married  Intimate Partner Violence: Not At Risk (09/28/2022)   Humiliation, Afraid, Rape, and Kick questionnaire    Fear of Current or Ex-Partner: No    Emotionally Abused: No    Physically Abused: No    Sexually Abused: No    Review of Systems  All other systems reviewed and are negative.       Objective    BP 119/85   Pulse 86   Temp 98.6 F (37 C) (Oral)   Resp 16   Wt 246 lb 3.2 oz (111.7 kg)   SpO2 97%   BMI 39.14 kg/m   Physical Exam Vitals and nursing note reviewed.  Constitutional:      General: She is not in acute distress.    Appearance: She is obese.  Cardiovascular:     Rate and Rhythm: Normal rate and regular rhythm.  Pulmonary:     Effort: Pulmonary effort is normal.     Breath sounds: Normal breath sounds.  Abdominal:     Palpations: Abdomen is soft.     Tenderness: There is no abdominal tenderness.  Neurological:     General: No focal deficit present.     Mental Status: She is alert and oriented to person, place, and time.         Assessment & Plan:   Type 2 diabetes mellitus with other specified complication, with long-term current use of insulin (HCC) -     POCT glycosylated hemoglobin (Hb A1C)  Essential hypertension  Encounter for surveillance of contraceptive pills  Hyperlipidemia, unspecified hyperlipidemia type  Other orders -     Norgestimate-Eth Estradiol; Take 1 tablet by mouth daily.  Dispense: 84 tablet; Refill: 1     Return in about 3 months (around 03/31/2023) for follow up, chronic med issues.   Tommie Raymond,  MD

## 2023-01-13 ENCOUNTER — Ambulatory Visit: Payer: Commercial Managed Care - PPO | Attending: Family Medicine | Admitting: Pharmacist

## 2023-01-13 ENCOUNTER — Encounter: Payer: Self-pay | Admitting: Pharmacist

## 2023-01-13 ENCOUNTER — Telehealth: Payer: Self-pay

## 2023-01-13 ENCOUNTER — Other Ambulatory Visit: Payer: Self-pay

## 2023-01-13 DIAGNOSIS — Z7985 Long-term (current) use of injectable non-insulin antidiabetic drugs: Secondary | ICD-10-CM | POA: Diagnosis not present

## 2023-01-13 DIAGNOSIS — E119 Type 2 diabetes mellitus without complications: Secondary | ICD-10-CM | POA: Diagnosis not present

## 2023-01-13 NOTE — Telephone Encounter (Signed)
 Pharmacy Patient Advocate Encounter  Received notification from [facility] that Prior Authorization for [facility] SENSOR has been APPROVED from 01/13/2023 to 07/13/2023   PA #/Case ID/Reference #: [id number]

## 2023-01-13 NOTE — Progress Notes (Signed)
    S:     No chief complaint on file.  28 y.o. female who presents for diabetes evaluation, education, and management. PMH is significant for T2DM, hx DKA, HTN, PCOS, MDD, obesity.   Patient was referred and last seen by Primary Care Provider, Dr. Tanda, on 12/31/2022. At that visit A1c was 7.3%. Of note, we have seen her before. Patient was last seen by pharmacy on 12/06/2022.   Today, patient arrives in good spirits and presents without any assistance. Patient is continuing to tolerate Trulicity  well. She does not take metformin  d/t prior GI side effects. She is not currently taking insulin . She brings her CGM report in for review.    Family/Social History:  -Fhx: depression, DM, heart disease -Tobacco: does not smoke cigarettes - vapes.  -Alcohol: none since hospitalization  Current diabetes medications include: Trulicity  1.5 mg weekly Current hypertension medications include: lisinopril  20 mg daily, metoprolol  succinate 50 mg daily Current hyperlipidemia medications include: atorvastatin  40 mg daily  Patient reports adherence to all medications as prescribed.   Insurance coverage: UHC / [place] Medicaid  Patient reports hypoglycemic events.  Patient denies nocturia (nighttime urination).  Patient denies neuropathy (nerve pain).  Patient reports visual changes. Patient reports self foot exams.   Patient reported dietary habits: Eats 3 meals/day Breakfast: fruit (berries), protein shake, keto cereal w/ 2% milk, oatmeal on other days Lunch: varies - will sometimes not get a lunch. Salad w/ chips. Dinner: largest meal (rice and/or potatoes), salad, protein  Drinks: water, Simply (Lite) Lemonade  Patient-reported exercise habits: walks sometimes   O:  Date of Download: 01/13/2023 - data reflects 14-day report % Time CGM is active: 100% Average Glucose: 142 mg/dL Glucose Management Indicator: 6.7  Glucose Variability: n/a (goal <36%) Time in Goal:  - Time in range 70-180:  89% - Time above range: 11% - Time below range: 0%   Lab Results  Component Value Date   HGBA1C 7.3 (A) 12/31/2022   There were no vitals filed for this visit.  Lipid Panel     Component Value Date/Time   CHOL 112 10/05/2022 1020   TRIG 130 10/05/2022 1020   HDL 41 10/05/2022 1020   CHOLHDL 2.7 10/05/2022 1020   CHOLHDL 5.3 05/21/2017 0648   VLDL 22 05/21/2017 0648   LDLCALC 48 10/05/2022 1020    Clinical Atherosclerotic Cardiovascular Disease (ASCVD): No  The ASCVD Risk score (Arnett DK, et al., 2019) failed to calculate for the following reasons:   The 2019 ASCVD risk score is only valid for ages 50 to 65   A/P: Diabetes longstanding currently close to goal based on A1c. GMI on her Dexcom is at goal based on control over the last 14 days.  She needs a new PA approved for 2025 for the sensors. Patient is able to verbalize appropriate hypoglycemia management plan. Medication adherence appears optimal.  -Continued GLP-1 Trulicity  (dulaglutide ) at 1.5 mg once weekly.  -Patient educated on purpose, proper use, and potential adverse effects of medications.  -Extensively discussed pathophysiology of diabetes, recommended lifestyle interventions, dietary effects on blood sugar control.  -Counseled on s/sx of and management of hypoglycemia.  -Next A1c anticipated 03/2023.   Written patient instructions provided. Patient verbalized understanding of treatment plan.  Total time in face to face counseling 30 minutes.    Follow-up:  Pharmacist: in 1 month  Herlene Fleeta Morris, PharmD, [place], CPP Clinical Pharmacist [facility] [phone]

## 2023-01-20 ENCOUNTER — Other Ambulatory Visit: Payer: Self-pay

## 2023-02-07 ENCOUNTER — Other Ambulatory Visit: Payer: Self-pay

## 2023-02-08 ENCOUNTER — Other Ambulatory Visit: Payer: Self-pay

## 2023-02-10 NOTE — Progress Notes (Signed)
S:     Chief Complaint  Patient presents with   Diabetes   28 y.o. female who presents for diabetes evaluation, education, and management. PMH is significant for T2DM with hx of DKA, HTN, PCOS, MDD, obesity.   Patient was referred and last seen by Primary Care Provider, Dr. Andrey Campanile, on 12/31/2022. At that visit A1c was 7.3%. At last pharmacy visit on 12/31/22, patient was tolerating Trulicity well. Her GMI per Dexcom G7 was 6.7% and TIR 89%. Medication regimen was continued. PA for Dexcom sensors was reapproved.   Today, patient arrives in good spirits and presents without any assistance. She reports that her Dexcom sensor fell off 4-5 days ago, and she has only monitored her BG via fingerstick once since then due to feeling faint - but it was not low. She is overall tolerating Trulicity well.Reports that the day after Trulicity she does not feel like eating much - just nibbles on fruit but this gets better throughout the week. She denies s/sx of hyperglycemia. Repors that her dietary habits have not changed, but she has some room to decrease her bread intake. She has started exercising with her roommate recently.   Family/Social History:  -Fhx: depression, DM, heart disease -Tobacco: does not smoke cigarettes - vapes.  -Alcohol: none since hospitalization  Current diabetes medications include: Trulicity 1.5 mg weekly (Thursday) Diabetes medications previously tried: metformin - severe nausea  Current hypertension medications include: lisinopril 20 mg daily, metoprolol succinate 50 mg daily Current hyperlipidemia medications include: atorvastatin 40 mg daily  Patient reports adherence to all medications as prescribed. Denies adverse effects  Insurance coverage: UHC / [place] Medicaid  Patient denies hypoglycemic events. Lowest BG noted 98.   Patient denies nocturia (nighttime urination).  Patient denies neuropathy (nerve pain).  Patient reports visual changes. Patient reports self  foot exams.   Patient reported dietary habits: Eats 3 meals/day  Breakfast: fruit (berries), protein shake, keto cereal w/ 2% milk, oatmeal on other days Lunch: varies - will sometimes not get a lunch. Salad w/ chips. Sandwhich. Dinner: largest meal (rice and/or potatoes), salad, protein  Drinks: water, Simply (Lite) Lemonade  Patient-reported exercise habits: walking with her roommate - 2 miles everyday.  O:  No CGM in 4-5 days. Has checked finger stick ~1x when she felt a little faint - was not low.  States usually running 120-150, after eating goes up to 200-220. She does not have her reader today to review recent BG data prior to sensor falling off  Most recent Date of Download: 01/13/2023 - data reflects 14-day report % Time CGM is active: 100% Average Glucose: 142 mg/dL Glucose Management Indicator: 6.7  Glucose Variability: n/a (goal <36%) Time in Goal:  - Time in range 70-180: 89% - Time above range: 11% - Time below range: 0%   Lab Results  Component Value Date   HGBA1C 7.3 (A) 12/31/2022   There were no vitals filed for this visit.  Lipid Panel     Component Value Date/Time   CHOL 112 10/05/2022 1020   TRIG 130 10/05/2022 1020   HDL 41 10/05/2022 1020   CHOLHDL 2.7 10/05/2022 1020   CHOLHDL 5.3 05/21/2017 0648   VLDL 22 05/21/2017 0648   LDLCALC 48 10/05/2022 1020    Clinical Atherosclerotic Cardiovascular Disease (ASCVD): No  The ASCVD Risk score (Arnett DK, et al., 2019) failed to calculate for the following reasons:   The 2019 ASCVD risk score is only valid for ages 16 to 68  A/P: Diabetes longstanding currently close to goal based on A1c of 7.3% (goal < 7%) and last TIR of 89% above goal < 70%. Remarkably improved from A1c of 15% in May 2024. Unfortunately, we do not have updated data to review today. Patient denies changes in diet and denies s/x of hypo or hyperglycemia. She has recently increased physical activity. She is tolerating Trulicity well and  would like to continue on the 1.5 mg weekly dose rather than titrating due to continued effects of appetite suppression, glycemic control, and weight loss. Medication adherence appears optimal. UACR WNL.  -Continued GLP-1 Trulicity (dulaglutide) at 1.5 mg once weekly.  -Patient educated on purpose, proper use, and potential adverse effects of medications.  -Extensively discussed pathophysiology of diabetes, recommended lifestyle interventions, dietary effects on blood sugar control. Congratulated on increased physical activity -Facilitated refill request of Dexcom G7 sensors at [facility] pharmacy. Patient to pick up sensor refill after appointment today. She was also educated how to request a replacement from [facility] in the event her sensor stops working or falls off early again in the future.  -Counseled on s/sx of and management of hypoglycemia.  -Patient is appropriately treated with a high intensity statin -Next A1c anticipated 03/2023 at next PCP appointment  Patient verbalized understanding of treatment plan.  Total time in face to face counseling 20 minutes.    Follow-up:  PCP: 04/01/23 Pharmacist: in 2 months - 04/18/23  Nils Pyle, PharmD PGY1 Pharmacy Resident  Butch Penny, PharmD, BCACP, CPP Clinical Pharmacist [facility] [phone]

## 2023-02-14 ENCOUNTER — Ambulatory Visit: Payer: Commercial Managed Care - PPO | Attending: Family Medicine | Admitting: Pharmacist

## 2023-02-14 ENCOUNTER — Other Ambulatory Visit: Payer: Self-pay

## 2023-02-14 ENCOUNTER — Encounter: Payer: Self-pay | Admitting: Pharmacist

## 2023-02-14 DIAGNOSIS — E119 Type 2 diabetes mellitus without complications: Secondary | ICD-10-CM | POA: Diagnosis not present

## 2023-02-14 DIAGNOSIS — Z7985 Long-term (current) use of injectable non-insulin antidiabetic drugs: Secondary | ICD-10-CM | POA: Diagnosis not present

## 2023-03-08 ENCOUNTER — Other Ambulatory Visit: Payer: Self-pay

## 2023-03-16 ENCOUNTER — Other Ambulatory Visit: Payer: Self-pay

## 2023-04-01 ENCOUNTER — Encounter: Payer: Self-pay | Admitting: Family Medicine

## 2023-04-01 ENCOUNTER — Ambulatory Visit: Payer: Commercial Managed Care - PPO | Admitting: Family Medicine

## 2023-04-01 VITALS — BP 120/85 | HR 80 | Temp 98.8°F | Resp 18 | Ht 66.0 in | Wt 248.2 lb

## 2023-04-01 DIAGNOSIS — M2550 Pain in unspecified joint: Secondary | ICD-10-CM

## 2023-04-01 DIAGNOSIS — E785 Hyperlipidemia, unspecified: Secondary | ICD-10-CM | POA: Diagnosis not present

## 2023-04-01 DIAGNOSIS — Z794 Long term (current) use of insulin: Secondary | ICD-10-CM | POA: Diagnosis not present

## 2023-04-01 DIAGNOSIS — E1169 Type 2 diabetes mellitus with other specified complication: Secondary | ICD-10-CM

## 2023-04-01 DIAGNOSIS — I10 Essential (primary) hypertension: Secondary | ICD-10-CM | POA: Diagnosis not present

## 2023-04-01 DIAGNOSIS — E66813 Obesity, class 3: Secondary | ICD-10-CM | POA: Diagnosis not present

## 2023-04-01 DIAGNOSIS — Z6841 Body Mass Index (BMI) 40.0 and over, adult: Secondary | ICD-10-CM

## 2023-04-01 DIAGNOSIS — Z7985 Long-term (current) use of injectable non-insulin antidiabetic drugs: Secondary | ICD-10-CM

## 2023-04-01 LAB — POCT GLYCOSYLATED HEMOGLOBIN (HGB A1C): Hemoglobin A1C: 7.1 % — AB (ref 4.0–5.6)

## 2023-04-01 NOTE — Progress Notes (Signed)
 Established Patient Office Visit  Subjective    Patient ID: Stacy Wang, female    DOB: September 17, 1995  Age: 28 y.o. MRN: [id number]  CC:  Chief Complaint  Patient presents with   Follow-up    3 month    HPI Stacy Wang presents for routine follow up for chronic med issues including diabetes and hypertension Patient reports med compliance and denies acute complaints.   Outpatient Encounter Medications as of 04/01/2023  Medication Sig   albuterol (VENTOLIN HFA) 108 (90 Base) MCG/ACT inhaler Inhale 1-2 puffs into the lungs every 6 (six) hours as needed for wheezing or shortness of breath.   atorvastatin (LIPITOR) 40 MG tablet TAKE 1 TABLET(40 MG) BY MOUTH DAILY   Continuous Glucose Receiver (DEXCOM G7 RECEIVER) DEVI Use to check blood glucose continuously.   Continuous Glucose Sensor (DEXCOM G7 SENSOR) MISC Use to check blood glucose throughout the day. Changes sensors once every 10 days.   Dulaglutide (TRULICITY) 1.5 MG/0.5ML SOAJ Inject 1.5 mg into the skin once a week.   lisinopril (ZESTRIL) 20 MG tablet TAKE 1 TABLET(20 MG) BY MOUTH DAILY   metoprolol succinate (TOPROL-XL) 50 MG 24 hr tablet Take 1 tablet (50 mg total) by mouth daily. Take with or immediately following a meal.   norgestimate-ethinyl estradiol (ORTHO-CYCLEN) 0.25-35 MG-MCG tablet Take 1 tablet by mouth daily.   No facility-administered encounter medications on file as of 04/01/2023.    Past Medical History:  Diagnosis Date   Bell palsy    Bell's palsy    Diabetes mellitus without complication (HCC)    type 2   Hypertension    Migraine     Past Surgical History:  Procedure Laterality Date   WISDOM TOOTH EXTRACTION      Family History  Problem Relation Age of Onset   Depression Mother    Diabetes Father    Heart disease Father     Social History   Socioeconomic History   Marital status: Single    Spouse name: Not on file   Number of children: 0   Years of education: Some colle   Highest  education level: Not on file  Occupational History   Occupation: Personal assistant - customer service  Tobacco Use   Smoking status: Some Days    Current packs/day: 0.00    Average packs/day: 0.3 packs/day for 2.0 years (0.5 ttl pk-yrs)    Types: E-cigarettes, Cigarettes    Start date: 05/16/2015    Last attempt to quit: 05/15/2017    Years since quitting: 5.8   Smokeless tobacco: Never  Vaping Use   Vaping status: Every Day   Substances: Nicotine, Flavoring  Substance and Sexual Activity   Alcohol use: Not Currently    Alcohol/week: 0.0 standard drinks of alcohol    Comment: 1/2 bottle liquor in one day for the week   Drug use: No   Sexual activity: Not Currently  Other Topics Concern   Not on file  Social History Narrative   Right handed.   Lives at home with roommates.   Caffeine occasionally - not daily.   Social Drivers of Health   Financial Resource Strain: High Risk (09/28/2022)   Overall Financial Resource Strain (CARDIA)    Difficulty of Paying Living Expenses: Hard  Food Insecurity: Food Insecurity Present (09/28/2022)   Hunger Vital Sign    Worried About Running Out of Food in the Last Year: Sometimes true    Ran Out of Food in the Last Year: Sometimes true  Transportation  Needs: No Transportation Needs (09/28/2022)   PRAPARE - Administrator, Civil Service (Medical): No    Lack of Transportation (Non-Medical): No  Physical Activity: Inactive (09/28/2022)   Exercise Vital Sign    Days of Exercise per Week: 0 days    Minutes of Exercise per Session: 0 min  Stress: Stress Concern Present (09/28/2022)   [employer] of Occupational Health - Occupational Stress Questionnaire    Feeling of Stress : Very much  Social Connections: Socially Isolated (09/28/2022)   Social Connection and Isolation Panel [NHANES]    Frequency of Communication with Friends and Family: More than three times a week    Frequency of Social Gatherings with Friends and Family: Once a week     Attends Religious Services: Never    Database administrator or Organizations: No    Attends Banker Meetings: Never    Marital Status: Never married  Intimate Partner Violence: Not At Risk (09/28/2022)   Humiliation, Afraid, Rape, and Kick questionnaire    Fear of Current or Ex-Partner: No    Emotionally Abused: No    Physically Abused: No    Sexually Abused: No    Review of Systems  All other systems reviewed and are negative.       Objective    BP 120/85   Pulse 80   Temp 98.8 F (37.1 C) (Oral)   Resp 18   Ht 5\' 6"  (1.676 m)   Wt 248 lb 3.2 oz (112.6 kg)   SpO2 95%   BMI 40.06 kg/m   Physical Exam Vitals and nursing note reviewed.  Constitutional:      General: She is not in acute distress.    Appearance: She is obese.  Cardiovascular:     Rate and Rhythm: Normal rate and regular rhythm.  Pulmonary:     Effort: Pulmonary effort is normal.     Breath sounds: Normal breath sounds.  Abdominal:     Palpations: Abdomen is soft.     Tenderness: There is no abdominal tenderness.  Neurological:     General: No focal deficit present.     Mental Status: She is alert and oriented to person, place, and time.         Assessment & Plan:   Type 2 diabetes mellitus with other specified complication, with long-term current use of insulin (HCC) -     POCT glycosylated hemoglobin (Hb A1C)  Essential hypertension  Hyperlipidemia, unspecified hyperlipidemia type  Class 3 severe obesity due to excess calories with serious comorbidity and body mass index (BMI) of 40.0 to 44.9 in adult [facility])  Long-term current use of injectable noninsulin antidiabetic medication  Arthralgia, unspecified joint     Return in about 3 months (around 07/02/2023) for follow up, chronic med issues.   Tommie Raymond, MD

## 2023-04-02 ENCOUNTER — Other Ambulatory Visit (HOSPITAL_BASED_OUTPATIENT_CLINIC_OR_DEPARTMENT_OTHER): Payer: Self-pay

## 2023-04-04 ENCOUNTER — Other Ambulatory Visit: Payer: Self-pay

## 2023-04-05 ENCOUNTER — Other Ambulatory Visit: Payer: Self-pay

## 2023-04-18 ENCOUNTER — Ambulatory Visit: Payer: Commercial Managed Care - PPO | Admitting: Pharmacist

## 2023-04-18 ENCOUNTER — Other Ambulatory Visit: Payer: Self-pay

## 2023-04-19 ENCOUNTER — Other Ambulatory Visit: Payer: Self-pay

## 2023-05-24 ENCOUNTER — Ambulatory Visit: Payer: Self-pay | Admitting: Pharmacist

## 2023-06-14 ENCOUNTER — Encounter (HOSPITAL_BASED_OUTPATIENT_CLINIC_OR_DEPARTMENT_OTHER): Payer: Self-pay | Admitting: Emergency Medicine

## 2023-06-14 ENCOUNTER — Other Ambulatory Visit: Payer: Self-pay

## 2023-06-14 ENCOUNTER — Emergency Department (HOSPITAL_BASED_OUTPATIENT_CLINIC_OR_DEPARTMENT_OTHER)
Admission: EM | Admit: 2023-06-14 | Discharge: 2023-06-14 | Disposition: A | Discharge status: Home / Self Care | Attending: Emergency Medicine | Admitting: Emergency Medicine

## 2023-06-14 DIAGNOSIS — R220 Localized swelling, mass and lump, head: Secondary | ICD-10-CM | POA: Diagnosis present

## 2023-06-14 DIAGNOSIS — I10 Essential (primary) hypertension: Secondary | ICD-10-CM | POA: Diagnosis not present

## 2023-06-14 DIAGNOSIS — Z794 Long term (current) use of insulin: Secondary | ICD-10-CM | POA: Diagnosis not present

## 2023-06-14 DIAGNOSIS — E119 Type 2 diabetes mellitus without complications: Secondary | ICD-10-CM | POA: Insufficient documentation

## 2023-06-14 DIAGNOSIS — Z79899 Other long term (current) drug therapy: Secondary | ICD-10-CM | POA: Insufficient documentation

## 2023-06-14 DIAGNOSIS — T464X5A Adverse effect of angiotensin-converting-enzyme inhibitors, initial encounter: Secondary | ICD-10-CM | POA: Insufficient documentation

## 2023-06-14 MED ORDER — PREDNISONE 10 MG PO TABS: 50.0000 mg | ORAL_TABLET | Freq: Every day | ORAL | 0 refills | Status: DC

## 2023-06-14 MED ORDER — DIPHENHYDRAMINE HCL 25 MG PO CAPS
25.0000 mg | ORAL_CAPSULE | Freq: Once | ORAL | Status: AC
  Administered 2023-06-14: 25 mg via ORAL
  Filled 2023-06-14: qty 1

## 2023-06-14 MED ORDER — DIPHENHYDRAMINE HCL 25 MG PO TABS: 25.0000 mg | ORAL_TABLET | Freq: Four times a day (QID) | ORAL | 0 refills | Status: DC | PRN

## 2023-06-14 NOTE — ED Triage Notes (Signed)
 Severe facial swelling. Takes lisinopril . Given steroid injection and benadryl pta by UC. States tongue is tingling. Denies difficulty breathing.

## 2023-06-14 NOTE — Discharge Instructions (Addendum)
 You were seen in the emergency room for facial swelling that appears to be because of lisinopril .  Please discontinue lisinopril  immediately.  Take Benadryl and prednisone  as prescribed. Return to the ER immediately if you start having worsening swelling, difficulty swallowing, difficulty breathing.

## 2023-06-14 NOTE — ED Provider Notes (Signed)
 [place] EMERGENCY DEPARTMENT AT [facility] Provider Note   CSN: [id number] Arrival date & time: 06/14/23  1055     History  Chief Complaint  Patient presents with   Facial Swelling    Stacy Wang is a 28 y.o. female.  HPI    28 year old female comes in with chief complaint of facial swelling.  Patient has history of Bell's palsy, hypertension, diabetes.  Patient states that last year she was started on lisinopril .  Sometime in August, she developed facial swelling, but it resolved in a day by itself.  Yesterday in the evening she started developing facial swelling again.  Her lip swelled up-and when she woke up, the swelling was still present, and worsened, prompting her to go to the urgent care.  The urgent care determined that patient most likely has lisinopril  induced angioedema.  Patient was given steroids, Benadryl and advised to come to the ER.  Patient states that while the swelling was worsening this morning, since she has been in the ER, her swelling has now stabilized and actually improved.  She shows me pictures of the swelling at its peak, and indeed her swelling has actually come down.  She denies any trouble swallowing, breathing.  Home Medications Prior to Admission medications   Medication Sig Start Date End Date Taking? Authorizing Provider  diphenhydrAMINE (BENADRYL) 25 MG tablet Take 1 tablet (25 mg total) by mouth every 6 (six) hours as needed. 06/14/23  Yes Deatra Face, MD  predniSONE  (DELTASONE ) 10 MG tablet Take 5 tablets (50 mg total) by mouth daily. 06/14/23  Yes Deatra Face, MD  albuterol  (VENTOLIN  HFA) 108 (90 Base) MCG/ACT inhaler Inhale 1-2 puffs into the lungs every 6 (six) hours as needed for wheezing or shortness of breath. 07/09/22   Joaquin Mulberry, MD  atorvastatin  (LIPITOR) 40 MG tablet TAKE 1 TABLET(40 MG) BY MOUTH DAILY 12/22/22   Abraham Abo, MD  Continuous Glucose Receiver (DEXCOM G7 RECEIVER) DEVI Use to check blood  glucose continuously. 10/14/22   Newlin, Enobong, MD  Continuous Glucose Sensor (DEXCOM G7 SENSOR) MISC Use to check blood glucose throughout the day. Changes sensors once every 10 days. 10/14/22   Newlin, Enobong, MD  Dulaglutide  (TRULICITY ) 1.5 MG/0.5ML SOAJ Inject 1.5 mg into the skin once a week. 11/05/22   Newlin, Enobong, MD  metoprolol  succinate (TOPROL -XL) 50 MG 24 hr tablet Take 1 tablet (50 mg total) by mouth daily. Take with or immediately following a meal. 10/05/22   Joaquin Mulberry, MD  norgestimate -ethinyl estradiol  (ORTHO-CYCLEN) 0.25-35 MG-MCG tablet Take 1 tablet by mouth daily. 01/03/23   Abraham Abo, MD      Allergies    Bee pollen, Diphenhydramine, Metformin  and related, and Pollen extract    Review of Systems   Review of Systems  All other systems reviewed and are negative.   Physical Exam Updated Vital Signs BP (!) 143/84   Pulse 73   Temp 97.8 F (36.6 C)   Resp 18   SpO2 98%  Physical Exam Vitals and nursing note reviewed.  Constitutional:      Appearance: She is well-developed.  HENT:     Head: Atraumatic.     Mouth/Throat:     Comments: Patient's lip is swollen, left upper slight appears slightly worse.  No tongue swelling.  No trismus.  No stridor or drooling. Cardiovascular:     Rate and Rhythm: Normal rate.  Pulmonary:     Effort: Pulmonary effort is normal.  Musculoskeletal:     Cervical  back: Normal range of motion and neck supple.  Skin:    General: Skin is warm and dry.  Neurological:     Mental Status: She is alert and oriented to person, place, and time.     ED Results / Procedures / Treatments   Labs (all labs ordered are listed, but only abnormal results are displayed) Labs Reviewed - No data to display  EKG None  Radiology No results found.  Procedures Procedures    Medications Ordered in ED Medications  diphenhydrAMINE (BENADRYL) capsule 25 mg (has no administration in time range)    ED Course/ Medical Decision  Making/ A&P                                 Medical Decision Making Risk OTC drugs. Prescription drug management.   28 year old female with history of Bell's palsy, on lisinopril  comes in with chief complaint of facial swelling.  This is a second episode of swelling.  Patient has no known allergies.  She denies any shortness of breath, wheezing, drooling.  Based on history and exam, differential diagnosis includes ACE induced angioedema, anaphylaxis.  Patient has no family history of angioedema, therefore unlikely the for this to be hereditary  angioedema.  Patient has been in the ER now 2-1/2 hours.  She is actually improving.  I did discuss with her that we can offer her TXA.  I do not think she needs EpiPen or FFP right now.  Patient states that she prefers not getting TXA in the setting of her symptoms improving now.  I have advised her to stop lisinopril  immediately.  I informed her that every exposure, symptoms can be more pronounced and can become life-threatening.  She will see her PCP soon as possible and not take lisinopril .  We will give her Benadryl and prednisone .  Return precautions to the ER discussed with the patient and family member.  Final Clinical Impression(s) / ED Diagnoses Final diagnoses:  Angiotensin converting enzyme inhibitor-aggravated angioedema, initial encounter    Rx / DC Orders ED Discharge Orders          Ordered    predniSONE  (DELTASONE ) 10 MG tablet  Daily        06/14/23 1338    diphenhydrAMINE (BENADRYL) 25 MG tablet  Every 6 hours PRN        06/14/23 1338              Deatra Face, MD 06/14/23 1343

## 2023-06-15 ENCOUNTER — Other Ambulatory Visit: Payer: Self-pay

## 2023-06-15 ENCOUNTER — Other Ambulatory Visit: Payer: Self-pay | Admitting: Family Medicine

## 2023-06-15 ENCOUNTER — Telehealth: Payer: Self-pay | Admitting: *Deleted

## 2023-06-15 NOTE — Telephone Encounter (Signed)
 I have attempted without success to contact this patient by phone to return their call and I left a message on answering machine.

## 2023-06-16 ENCOUNTER — Other Ambulatory Visit: Payer: Self-pay

## 2023-06-16 ENCOUNTER — Other Ambulatory Visit: Payer: Self-pay | Admitting: Pharmacist

## 2023-06-16 ENCOUNTER — Other Ambulatory Visit (HOSPITAL_COMMUNITY): Payer: Self-pay

## 2023-06-16 MED ORDER — ACCU-CHEK GUIDE TEST VI STRP: ORAL_STRIP | 6 refills | Status: DC

## 2023-06-16 MED ORDER — TRULICITY 1.5 MG/0.5ML ~~LOC~~ SOAJ
1.5000 mg | SUBCUTANEOUS | 1 refills | Status: DC
  Filled 2023-06-16: qty 2, 28d supply, fill #0

## 2023-06-16 MED ORDER — ACCU-CHEK GUIDE W/DEVICE KIT: PACK | 0 refills | Status: AC

## 2023-06-16 MED ORDER — ACCU-CHEK SOFTCLIX LANCETS MISC: 6 refills | Status: DC

## 2023-06-17 ENCOUNTER — Other Ambulatory Visit: Payer: Self-pay | Admitting: Family Medicine

## 2023-06-17 ENCOUNTER — Other Ambulatory Visit: Payer: Self-pay

## 2023-06-20 ENCOUNTER — Other Ambulatory Visit: Payer: Self-pay

## 2023-06-20 NOTE — Progress Notes (Signed)
 S:     No chief complaint on file.  28 y.o. female who presents for diabetes evaluation, education, and management. PMH is significant for T2DM with hx of DKA, HTN, PCOS, MDD, obesity.   Patient was referred and last seen by Primary Care Provider, Dr. Elvan Hamel, on 12/31/2022. At that visit A1c was 7.3%. At pharmacy visit on 12/31/22, patient was tolerating Trulicity  well. Her GMI per Dexcom G7 was 6.7% and TIR 89%. Medication regimen was continued. PA for Dexcom sensors was reapproved. At last pharmacy visit on 02/14/23, she was continued on Trulicity  and we assisted with obtaining refills of her Dexcom Sensor. Of note, she did present to the ED on 06/14/23 due to lisinopril -induced angioedema. 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   Today, patient arrives in good spirits and presents without any assistance. She reports that her Dexcom sensor fell off 4-5 days ago, and she has only monitored her BG via fingerstick once since then due to feeling faint - but it was not low. *** She is overall tolerating Trulicity  well. Reports that the day after Trulicity  she does not feel like eating much - just nibbles on fruit but this gets better throughout the week. She denies s/sx of hyperglycemia. Reports that her dietary habits have not changed, but she has some room to decrease her bread intake. She has started exercising with her roommate recently. *** She reports that she has *** stopped lisinopril  since her ED visit. *** any sx of HTN since stopping? HA? Blurry vision? Taking BP at home?   Family/Social History:  -Fhx: depression, DM, heart disease -Tobacco: does not smoke cigarettes - vapes.  -Alcohol: none since hospitalization  Current diabetes medications include: Trulicity  1.5 mg weekly (Thursday) Diabetes medications previously tried: metformin  - severe nausea  Current hypertension medications include: metoprolol  succinate 50 mg  daily Hypertension medications previously tried: lisinopril  -- angioedema   Current hyperlipidemia medications include: atorvastatin  40 mg daily  Patient reports adherence to all medications as prescribed. Denies adverse effects  Insurance coverage: [employer] / [place] [employer]  Patient denies hypoglycemic events. Lowest BG noted 98.   Patient denies nocturia (nighttime urination).  Patient denies neuropathy (nerve pain).  Patient reports visual changes. Patient reports self foot exams.   Patient reported dietary habits: Eats 3 meals/day  Breakfast: fruit (berries), protein shake, keto cereal w/ 2% milk, oatmeal on other days Lunch: varies - will sometimes not get a lunch. Salad w/ chips. Sandwhich. Dinner: largest meal (rice and/or potatoes), salad, protein  Drinks: water, Simply (Lite) Lemonade  Patient-reported exercise habits: walking with her roommate - 2 miles everyday.  BP readings at home: ***  O: *** No CGM in 4-5 days. Has checked finger stick ~1x when she felt a little faint - was not low.  States usually running 120-150, after eating goes up to 200-220. She does not have her reader today to review recent BG data prior to sensor falling off  Most recent Date of Download: 01/13/2023 - data reflects 14-day report % Time CGM is active: 100% Average Glucose: 142 mg/dL Glucose Management Indicator: 6.7  Glucose Variability: n/a (goal <36%) Time in Goal:  - Time in range 70-180: 89% - Time above range: 11% - Time below range: 0%   Lab Results  Component Value Date   HGBA1C 7.1 (A) 04/01/2023   There were no vitals filed for this visit.  Lipid Panel     Component Value  Date/Time   CHOL 112 10/05/2022 1020   TRIG 130 10/05/2022 1020   HDL 41 10/05/2022 1020   CHOLHDL 2.7 10/05/2022 1020   CHOLHDL 5.3 05/21/2017 0648   VLDL 22 05/21/2017 0648   LDLCALC 48 10/05/2022 1020    Clinical Atherosclerotic Cardiovascular Disease (ASCVD): No  The ASCVD Risk score (Arnett DK,  et al., 2019) failed to calculate for the following reasons:   The 2019 ASCVD risk score is only valid for ages 42 to 46   A/P: Diabetes longstanding currently close to goal based on A1c of 7.1% (goal < 7%) and last TIR of 89% above goal < 70%. Remarkably improved from A1c of 15% in May 2024. Unfortunately, we do not have updated data to review today. Patient denies changes in diet and denies s/x of hypo or hyperglycemia. She has recently increased physical activity. She is tolerating Trulicity  well and would like to continue on the 1.5 mg weekly dose rather than titrating due to continued effects of appetite suppression, glycemic control, and weight loss. Medication adherence appears optimal. UACR WNL.  -Continue GLP-1 Trulicity  (dulaglutide ) at 1.5 mg once weekly.  -Patient educated on purpose, proper use, and potential adverse effects of medications.  -Extensively discussed pathophysiology of diabetes, recommended lifestyle interventions, dietary effects on blood sugar control. Congratulated on increased physical activity -Facilitated refill request of Dexcom G7 sensors at [facility] pharmacy. Patient to pick up sensor refill after appointment today. She was also educated how to request a replacement from Dexcom in the event her sensor stops working or falls off early again in the future.  -Counseled on s/sx of and management of hypoglycemia.  -Patient is appropriately treated with a high intensity statin -Next A1c anticipated 03/2023 at next PCP appointment *** can obtain today   Hypertension longstanding currently *** controlled with BP at goal of < 130/80. She has stopped her lisinopril  following her visit to the ED. BP has/has not *** remained controlled since stopping this agent. Patient is/is not *** agreeable to initiating a new agent today to help maintain BP control. Unfortunately, cannot safely utilize ARB agents in setting of history of angioedema.  -START amlodipine  5 mg once daily  -Continue  metoprolol  succinate 50 mg daily -Educated patient on s/sx of adverse effects associated with amlodipine .  -Encouraged patient to monitor blood pressure at home and to continue with increased exercise routine ***  Patient verbalized understanding of treatment plan.  Total time in face to face counseling 20 minutes.    Follow-up:  PCP: 06/28/23 Pharmacist: in *** months  Juleen Oakland, PharmD PGY1 Pharmacy Resident

## 2023-06-21 ENCOUNTER — Ambulatory Visit: Payer: Self-pay | Attending: Family Medicine | Admitting: Pharmacist

## 2023-06-21 ENCOUNTER — Encounter: Payer: Self-pay | Admitting: Pharmacist

## 2023-06-21 ENCOUNTER — Other Ambulatory Visit: Payer: Self-pay

## 2023-06-21 VITALS — BP 138/94 | HR 93

## 2023-06-21 DIAGNOSIS — E1165 Type 2 diabetes mellitus with hyperglycemia: Secondary | ICD-10-CM

## 2023-06-21 DIAGNOSIS — F419 Anxiety disorder, unspecified: Secondary | ICD-10-CM

## 2023-06-21 DIAGNOSIS — E119 Type 2 diabetes mellitus without complications: Secondary | ICD-10-CM | POA: Diagnosis not present

## 2023-06-21 DIAGNOSIS — Z7985 Long-term (current) use of injectable non-insulin antidiabetic drugs: Secondary | ICD-10-CM | POA: Diagnosis not present

## 2023-06-21 DIAGNOSIS — I10 Essential (primary) hypertension: Secondary | ICD-10-CM | POA: Diagnosis not present

## 2023-06-21 LAB — POCT GLYCOSYLATED HEMOGLOBIN (HGB A1C): HbA1c, POC (controlled diabetic range): 9.5 % — AB (ref 0.0–7.0)

## 2023-06-21 MED ORDER — ACCU-CHEK GUIDE TEST VI STRP
ORAL_STRIP | 6 refills | Status: AC
  Filled 2023-06-21: qty 100, 33d supply, fill #0
  Filled 2023-06-22: qty 100, 30d supply, fill #0
  Filled 2023-07-15: qty 100, 33d supply, fill #0

## 2023-06-21 MED ORDER — ATORVASTATIN CALCIUM 40 MG PO TABS
40.0000 mg | ORAL_TABLET | Freq: Every day | ORAL | 1 refills | Status: DC
  Filled 2023-06-21: qty 30, 30d supply, fill #0
  Filled 2023-08-14: qty 30, 30d supply, fill #1
  Filled 2023-09-27: qty 30, 30d supply, fill #2
  Filled 2023-10-23: qty 30, 30d supply, fill #3
  Filled 2023-10-24: qty 90, 90d supply, fill #3

## 2023-06-21 MED ORDER — TRULICITY 0.75 MG/0.5ML ~~LOC~~ SOAJ
0.7500 mg | SUBCUTANEOUS | 1 refills | Status: DC
  Filled 2023-06-21 – 2023-06-22 (×3): qty 2, 28d supply, fill #0
  Filled 2023-07-15 – 2023-07-20 (×2): qty 2, 28d supply, fill #1

## 2023-06-21 MED ORDER — AMLODIPINE BESYLATE 5 MG PO TABS
5.0000 mg | ORAL_TABLET | Freq: Every day | ORAL | 1 refills | Status: DC
  Filled 2023-06-21: qty 30, 30d supply, fill #0

## 2023-06-21 MED ORDER — NORGESTIMATE-ETH ESTRADIOL 0.25-35 MG-MCG PO TABS
1.0000 | ORAL_TABLET | Freq: Every day | ORAL | 1 refills | Status: AC
  Filled 2023-06-21: qty 28, 28d supply, fill #0
  Filled 2023-07-15 – 2023-08-31 (×3): qty 84, 84d supply, fill #0
  Filled 2023-11-27: qty 84, 84d supply, fill #1
  Filled ????-??-??: fill #0

## 2023-06-21 MED ORDER — ACCU-CHEK SOFTCLIX LANCETS MISC
6 refills | Status: AC
  Filled 2023-06-21 (×2): qty 100, 33d supply, fill #0
  Filled 2023-07-15: qty 100, fill #0

## 2023-06-21 MED ORDER — METOPROLOL SUCCINATE ER 50 MG PO TB24
50.0000 mg | ORAL_TABLET | Freq: Every day | ORAL | 3 refills | Status: DC
  Filled 2023-06-21: qty 90, 90d supply, fill #0
  Filled 2023-06-22: qty 30, 30d supply, fill #0

## 2023-06-22 ENCOUNTER — Other Ambulatory Visit: Payer: Self-pay

## 2023-06-23 ENCOUNTER — Other Ambulatory Visit: Payer: Self-pay | Admitting: Family Medicine

## 2023-06-23 LAB — CMP14+EGFR
ALT: 13 IU/L (ref 0–32)
AST: 9 IU/L (ref 0–40)
Albumin: 4.3 g/dL (ref 4.0–5.0)
Alkaline Phosphatase: 85 IU/L (ref 44–121)
BUN/Creatinine Ratio: 13 (ref 9–23)
BUN: 10 mg/dL (ref 6–20)
Bilirubin Total: 0.3 mg/dL (ref 0.0–1.2)
CO2: 21 mmol/L (ref 20–29)
Calcium: 9.5 mg/dL (ref 8.7–10.2)
Chloride: 95 mmol/L — ABNORMAL LOW (ref 96–106)
Creatinine, Ser: 0.76 mg/dL (ref 0.57–1.00)
Globulin, Total: 2.7 g/dL (ref 1.5–4.5)
Glucose: 406 mg/dL — ABNORMAL HIGH (ref 70–99)
Potassium: 4.6 mmol/L (ref 3.5–5.2)
Sodium: 133 mmol/L — ABNORMAL LOW (ref 134–144)
Total Protein: 7 g/dL (ref 6.0–8.5)
eGFR: 109 mL/min/{1.73_m2} (ref >59)

## 2023-06-23 LAB — MICROALBUMIN / CREATININE URINE RATIO
Creatinine, Urine: 149.6 mg/dL
Microalb/Creat Ratio: 13 mg/g{creat} (ref 0–29)
Microalbumin, Urine: 18.8 ug/mL

## 2023-06-23 LAB — LIPID PANEL
Chol/HDL Ratio: 2.8 ratio (ref 0.0–4.4)
Cholesterol, Total: 124 mg/dL (ref 100–199)
HDL: 45 mg/dL (ref >39)
LDL Chol Calc (NIH): 62 mg/dL (ref 0–99)
Triglycerides: 89 mg/dL (ref 0–149)
VLDL Cholesterol Cal: 17 mg/dL (ref 5–40)

## 2023-06-27 ENCOUNTER — Ambulatory Visit: Payer: Self-pay | Admitting: Family Medicine

## 2023-06-28 ENCOUNTER — Ambulatory Visit (INDEPENDENT_AMBULATORY_CARE_PROVIDER_SITE_OTHER): Admitting: Family

## 2023-06-28 ENCOUNTER — Other Ambulatory Visit: Payer: Self-pay

## 2023-06-28 ENCOUNTER — Encounter: Payer: Self-pay | Admitting: Family

## 2023-06-28 VITALS — BP 140/90 | HR 96 | Temp 98.8°F | Resp 20 | Ht 67.0 in | Wt 249.0 lb

## 2023-06-28 DIAGNOSIS — F411 Generalized anxiety disorder: Secondary | ICD-10-CM

## 2023-06-28 DIAGNOSIS — T464X5D Adverse effect of angiotensin-converting-enzyme inhibitors, subsequent encounter: Secondary | ICD-10-CM

## 2023-06-28 DIAGNOSIS — Z09 Encounter for follow-up examination after completed treatment for conditions other than malignant neoplasm: Secondary | ICD-10-CM

## 2023-06-28 DIAGNOSIS — I10 Essential (primary) hypertension: Secondary | ICD-10-CM

## 2023-06-28 DIAGNOSIS — T783XXD Angioneurotic edema, subsequent encounter: Secondary | ICD-10-CM | POA: Diagnosis not present

## 2023-06-28 MED ORDER — FLUOXETINE HCL 10 MG PO TABS
10.0000 mg | ORAL_TABLET | Freq: Every day | ORAL | 0 refills | Status: DC
  Filled 2023-06-28: qty 30, 30d supply, fill #0

## 2023-06-28 MED ORDER — HYDROXYZINE PAMOATE 25 MG PO CAPS
25.0000 mg | ORAL_CAPSULE | Freq: Three times a day (TID) | ORAL | 2 refills | Status: DC | PRN
  Filled 2023-06-28: qty 30, 10d supply, fill #0
  Filled 2023-08-14: qty 30, 10d supply, fill #1
  Filled 2023-10-23 – 2023-10-30 (×2): qty 30, 10d supply, fill #2

## 2023-06-28 NOTE — Progress Notes (Signed)
 Patient ID: Stacy Wang, female    DOB: [birth year]  MRN: [id number]  CC: Emergency Department Follow-Up  Subjective: Stacy Wang is a 28 y.o. female who presents for Emergency Department follow-up.   Her concerns today include:  06/14/2023 [facility] Emergency Department at [facility] per MD note:  ED Course/ Medical Decision Making/ A&P                               Medical Decision Making Risk OTC drugs. Prescription drug management.     28 year old female with history of Bell's palsy, on lisinopril  comes in with chief complaint of facial swelling.   This is a second episode of swelling.  Patient has no known allergies.  She denies any shortness of breath, wheezing, drooling.   Based on history and exam, differential diagnosis includes ACE induced angioedema, anaphylaxis.  Patient has no family history of angioedema, therefore unlikely the for this to be hereditary  angioedema.   Patient has been in the ER now 2-1/2 hours.  She is actually improving.  I did discuss with her that we can offer her TXA.  I do not think she needs EpiPen or FFP right now.  Patient states that she prefers not getting TXA in the setting of her symptoms improving now.   I have advised her to stop lisinopril  immediately.  I informed her that every exposure, symptoms can be more pronounced and can become life-threatening.  She will see her PCP soon as possible and not take lisinopril .  We will give her Benadryl  and prednisone .  Return precautions to the ER discussed with the patient and family member.  Today's office visit 06/28/2023: - Reports feeling improved since Emergency Department visit. Denies red flag symptoms. Reports taking Amlodipine  for only week, no issues/concerns. She does not complain of red flag symptoms such as but not limited to chest pain, shortness of breath, worst headache of life, nausea/vomiting.  - Anxiety. States history of substance abuse but none currently.  States anxiety affecting work. She is established with a therapist. She would like to try anxiety medication and referral to Psychiatry.   Patient Active Problem List   Diagnosis Date Noted   Sore throat 05/19/2022   Postnasal drip 05/19/2022   Polycystic ovaries 05/19/2022   Irregular periods 05/19/2022   Impaired glucose tolerance 05/19/2022   Borderline personality disorder (HCC) 05/19/2022   Amenorrhea 05/19/2022   DKA (diabetic ketoacidosis) (HCC) 03/31/2022   PCOS (polycystic ovarian syndrome) 04/27/2018   ASCUS with positive high risk HPV cervical 04/27/2018   Morbid obesity (HCC) 04/06/2018   Type 2 diabetes mellitus without complication, without long-term current use of insulin  (HCC) 03/15/2018   Essential hypertension, benign 12/21/2017   Tachycardia 12/21/2017   Other abnormal glucose 12/21/2017   Major depressive disorder, recurrent severe without psychotic features (HCC) 05/20/2017   MDD (major depressive disorder), recurrent episode, severe (HCC) 05/20/2017   Hypertension 01/19/2017   Elevated blood-pressure reading without diagnosis of hypertension 10/29/2015   Right-sided Bell's palsy 02/07/2014   Shingles 02/07/2014     Current Outpatient Medications on File Prior to Visit  Medication Sig Dispense Refill   Accu-Chek Softclix Lancets lancets Use to check blood sugar 3 times daily. 100 each 6   albuterol  (VENTOLIN  HFA) 108 (90 Base) MCG/ACT inhaler Inhale 1-2 puffs into the lungs every 6 (six) hours as needed for wheezing or shortness of breath. 18 g 2   amLODipine  (  NORVASC ) 5 MG tablet Take 1 tablet (5 mg total) by mouth daily. 90 tablet 1   atorvastatin  (LIPITOR) 40 MG tablet Take 1 tablet (40 mg total) by mouth daily. 90 tablet 1   Blood Glucose Monitoring Suppl (ACCU-CHEK GUIDE) w/Device KIT Use to check blood sugar 3 times daily. 1 kit 0   Dulaglutide  (TRULICITY ) 0.75 MG/0.5ML SOAJ Inject 0.75 mg into the skin once a week. 2 mL 1   glucose blood (ACCU-CHEK  GUIDE TEST) test strip Use to check blood sugar 3 times daily. 100 each 6   metoprolol  succinate (TOPROL -XL) 50 MG 24 hr tablet Take 1 tablet (50 mg total) by mouth daily. Take with or immediately following a meal. 90 tablet 3   norgestimate -ethinyl estradiol  (MILI ) 0.25-35 MG-MCG tablet Take 1 tablet by mouth daily. 84 tablet 1   No current facility-administered medications on file prior to visit.    Allergies  Allergen Reactions   Lisinopril  Anaphylaxis    ACE-induced angioedema   Bee Pollen Itching   Diphenhydramine  Hives   Metformin  And Related Nausea And Vomiting   Pollen Extract Itching    Social History   Socioeconomic History   Marital status: Single    Spouse name: Not on file   Number of children: 0   Years of education: Some colle   Highest education level: Not on file  Occupational History   Occupation: Personal assistant - customer service  Tobacco Use   Smoking status: Some Days    Current packs/day: 0.00    Average packs/day: 0.3 packs/day for 2.0 years (0.5 ttl pk-yrs)    Types: E-cigarettes, Cigarettes    Start date: 05/16/2015    Last attempt to quit: 05/15/2017    Years since quitting: 6.1   Smokeless tobacco: Never  Vaping Use   Vaping status: Every Day   Substances: Nicotine , Flavoring  Substance and Sexual Activity   Alcohol use: Not Currently    Alcohol/week: 0.0 standard drinks of alcohol    Comment: 1/2 bottle liquor in one day for the week   Drug use: No   Sexual activity: Not Currently  Other Topics Concern   Not on file  Social History Narrative   Right handed.   Lives at home with roommates.   Caffeine occasionally - not daily.   Social Drivers of Health   Financial Resource Strain: High Risk (09/28/2022)   Overall Financial Resource Strain (CARDIA)    Difficulty of Paying Living Expenses: Hard  Food Insecurity: Food Insecurity Present (09/28/2022)   Hunger Vital Sign    Worried About Running Out of Food in the Last Year: Sometimes true    Ran  Out of Food in the Last Year: Sometimes true  Transportation Needs: No Transportation Needs (09/28/2022)   PRAPARE - Administrator, Civil Service (Medical): No    Lack of Transportation (Non-Medical): No  Physical Activity: Inactive (09/28/2022)   Exercise Vital Sign    Days of Exercise per Week: 0 days    Minutes of Exercise per Session: 0 min  Stress: Stress Concern Present (09/28/2022)   [employer] of Occupational Health - Occupational Stress Questionnaire    Feeling of Stress : Very much  Social Connections: Socially Isolated (09/28/2022)   Social Connection and Isolation Panel    Frequency of Communication with Friends and Family: More than three times a week    Frequency of Social Gatherings with Friends and Family: Once a week    Attends Religious Services: Never  Active Member of Clubs or Organizations: No    Attends Banker Meetings: Never    Marital Status: Never married  Intimate Partner Violence: Not At Risk (09/28/2022)   Humiliation, Afraid, Rape, and Kick questionnaire    Fear of Current or Ex-Partner: No    Emotionally Abused: No    Physically Abused: No    Sexually Abused: No    Family History  Problem Relation Age of Onset   Depression Mother    Diabetes Father    Heart disease Father     Past Surgical History:  Procedure Laterality Date   WISDOM TOOTH EXTRACTION      ROS: Review of Systems Negative except as stated above  PHYSICAL EXAM: BP (!) 140/90 (BP Location: Right Arm, Patient Position: Sitting, Cuff Size: Large)   Pulse 96   Temp 98.8 F (37.1 C) (Oral)   Resp 20   Ht 5' 7 (1.702 m)   Wt 249 lb (112.9 kg)   LMP 06/14/2023 (Approximate)   SpO2 96%   BMI 39.00 kg/m   Physical Exam HENT:     Head: Normocephalic and atraumatic.     Comments: Lips appear normal.    Nose: Nose normal.     Mouth/Throat:     Mouth: Mucous membranes are moist.     Pharynx: Oropharynx is clear.   Eyes:     Extraocular  Movements: Extraocular movements intact.     Conjunctiva/sclera: Conjunctivae normal.     Pupils: Pupils are equal, round, and reactive to light.    Cardiovascular:     Rate and Rhythm: Normal rate and regular rhythm.     Pulses: Normal pulses.     Heart sounds: Normal heart sounds.  Pulmonary:     Effort: Pulmonary effort is normal.     Breath sounds: Normal breath sounds.   Musculoskeletal:        General: Normal range of motion.     Cervical back: Normal range of motion and neck supple.   Neurological:     General: No focal deficit present.     Mental Status: She is alert and oriented to person, place, and time.   Psychiatric:        Mood and Affect: Mood normal.        Behavior: Behavior normal.     ASSESSMENT AND PLAN: 1. Angiotensin converting enzyme inhibitor-aggravated angioedema, subsequent encounter (Primary) 2. Primary hypertension - Blood pressure not at goal during today's visit. Patient asymptomatic without chest pressure, chest pain, palpitations, shortness of breath, worst headache of life, and any additional red flag symptoms. - Continue Amlodipine  as prescribed. Patient reports taking Amlodipine  for only 1 week. No refills needed as of present.  - Counseled on blood pressure goal of less than 130/80, low-sodium, DASH diet, medication compliance, and 150 minutes of moderate intensity exercise per week as tolerated. Counseled on medication adherence and adverse effects. - Follow-up with primary provider in 2 weeks or sooner if needed.  3. GAD (generalized anxiety disorder) - Patient denies thoughts of self-harm, suicidal ideations, homicidal ideations. - Fluoxetine  and Hydroxyzine  as prescribed. Counseled on medication adherence/adverse effects.  - Keep all scheduled appointments with therapist. - Referral to Psychiatry for evaluation/management.  - Follow-up with primary provider in 4 weeks or sooner if needed. - FLUoxetine  (PROZAC ) 10 MG tablet; Take 1  tablet (10 mg total) by mouth daily.  Dispense: 90 tablet; Refill: 0 - hydrOXYzine  (VISTARIL ) 25 MG capsule; Take 1 capsule (25 mg total) by  mouth every 8 (eight) hours as needed.  Dispense: 30 capsule; Refill: 2 - Ambulatory referral to Psychiatry   Patient was given the opportunity to ask questions.  Patient verbalized understanding of the plan and was able to repeat key elements of the plan. Patient was given clear instructions to go to Emergency Department or return to medical center if symptoms don't improve, worsen, or new problems develop.The patient verbalized understanding.   Orders Placed This Encounter  Procedures   Ambulatory referral to Psychiatry     Requested Prescriptions   Signed Prescriptions Disp Refills   FLUoxetine  (PROZAC ) 10 MG tablet 90 tablet 0    Sig: Take 1 tablet (10 mg total) by mouth daily.   hydrOXYzine  (VISTARIL ) 25 MG capsule 30 capsule 2    Sig: Take 1 capsule (25 mg total) by mouth every 8 (eight) hours as needed.    Return for Follow-Up or next available with Abraham Abo, MD.  Senaida Dama, NP

## 2023-06-30 ENCOUNTER — Other Ambulatory Visit: Payer: Self-pay

## 2023-07-04 ENCOUNTER — Ambulatory Visit: Admitting: Family Medicine

## 2023-07-12 ENCOUNTER — Other Ambulatory Visit: Payer: Self-pay

## 2023-07-12 ENCOUNTER — Encounter: Payer: Self-pay | Admitting: Family Medicine

## 2023-07-12 ENCOUNTER — Ambulatory Visit (INDEPENDENT_AMBULATORY_CARE_PROVIDER_SITE_OTHER): Admitting: Family Medicine

## 2023-07-12 VITALS — BP 133/96 | HR 73 | Wt 243.2 lb

## 2023-07-12 DIAGNOSIS — F411 Generalized anxiety disorder: Secondary | ICD-10-CM | POA: Diagnosis not present

## 2023-07-12 DIAGNOSIS — E66812 Obesity, class 2: Secondary | ICD-10-CM

## 2023-07-12 DIAGNOSIS — I10 Essential (primary) hypertension: Secondary | ICD-10-CM

## 2023-07-12 DIAGNOSIS — Z6838 Body mass index (BMI) 38.0-38.9, adult: Secondary | ICD-10-CM

## 2023-07-12 MED ORDER — AMLODIPINE BESYLATE 10 MG PO TABS
10.0000 mg | ORAL_TABLET | Freq: Every day | ORAL | 0 refills | Status: DC
Start: 2023-07-12 — End: 2023-10-23
  Filled 2023-07-12: qty 30, 30d supply, fill #0
  Filled 2023-08-14: qty 30, 30d supply, fill #1
  Filled 2023-09-27: qty 30, 30d supply, fill #2

## 2023-07-12 MED ORDER — FLUOXETINE HCL 10 MG PO TABS
10.0000 mg | ORAL_TABLET | Freq: Every day | ORAL | 0 refills | Status: DC
  Filled 2023-07-12: qty 90, 90d supply, fill #0
  Filled 2023-08-03: qty 30, 30d supply, fill #0
  Filled 2023-09-11: qty 30, 30d supply, fill #1
  Filled 2023-10-14: qty 30, 30d supply, fill #2

## 2023-07-12 MED ORDER — METOPROLOL SUCCINATE ER 50 MG PO TB24
50.0000 mg | ORAL_TABLET | Freq: Every day | ORAL | 0 refills | Status: DC
  Filled 2023-07-12: qty 30, 30d supply, fill #0

## 2023-07-12 NOTE — Progress Notes (Signed)
 Established Patient Office Visit  Subjective    Patient ID: Stacy Wang, female    DOB: [birth year]  Age: 28 y.o. MRN: [id number]  CC:  Chief Complaint  Patient presents with   Medical Management of Chronic Issues   Dizziness    HPI Vanessa Kampf presents for follow up of hypertension and anxiety/depression. Patient reports that she has not been taking meds and would like to be restarted on meds for mood.   Outpatient Encounter Medications as of 07/12/2023  Medication Sig   Accu-Chek Softclix Lancets lancets Use to check blood sugar 3 times daily.   albuterol  (VENTOLIN  HFA) 108 (90 Base) MCG/ACT inhaler Inhale 1-2 puffs into the lungs every 6 (six) hours as needed for wheezing or shortness of breath.   amLODipine  (NORVASC ) 10 MG tablet Take 1 tablet (10 mg total) by mouth daily.   atorvastatin  (LIPITOR) 40 MG tablet Take 1 tablet (40 mg total) by mouth daily.   Blood Glucose Monitoring Suppl (ACCU-CHEK GUIDE) w/Device KIT Use to check blood sugar 3 times daily.   Dulaglutide  (TRULICITY ) 0.75 MG/0.5ML SOAJ Inject 0.75 mg into the skin once a week.   glucose blood (ACCU-CHEK GUIDE TEST) test strip Use to check blood sugar 3 times daily.   hydrOXYzine  (VISTARIL ) 25 MG capsule Take 1 capsule (25 mg total) by mouth every 8 (eight) hours as needed.   norgestimate -ethinyl estradiol  (MILI ) 0.25-35 MG-MCG tablet Take 1 tablet by mouth daily.   [DISCONTINUED] amLODipine  (NORVASC ) 5 MG tablet Take 1 tablet (5 mg total) by mouth daily.   [DISCONTINUED] FLUoxetine  (PROZAC ) 10 MG tablet Take 1 tablet (10 mg total) by mouth daily.   [DISCONTINUED] metoprolol  succinate (TOPROL -XL) 50 MG 24 hr tablet Take 1 tablet (50 mg total) by mouth daily. Take with or immediately following a meal.   FLUoxetine  (PROZAC ) 10 MG tablet Take 1 tablet (10 mg total) by mouth daily.   metoprolol  succinate (TOPROL -XL) 50 MG 24 hr tablet Take 1 tablet (50 mg total) by mouth daily. Take with or immediately following a  meal.   [DISCONTINUED] lisinopril  (ZESTRIL ) 20 MG tablet TAKE 1 TABLET(20 MG) BY MOUTH DAILY (Patient not taking: Reported on 07/12/2023)   No facility-administered encounter medications on file as of 07/12/2023.    Past Medical History:  Diagnosis Date   Bell palsy    Bell's palsy    Diabetes mellitus without complication (HCC)    type 2   Hypertension    Migraine     Past Surgical History:  Procedure Laterality Date   WISDOM TOOTH EXTRACTION      Family History  Problem Relation Age of Onset   Depression Mother    Diabetes Father    Heart disease Father     Social History   Socioeconomic History   Marital status: Single    Spouse name: Not on file   Number of children: 0   Years of education: Some colle   Highest education level: Not on file  Occupational History   Occupation: Personal assistant - customer service  Tobacco Use   Smoking status: Some Days    Current packs/day: 0.00    Average packs/day: 0.3 packs/day for 2.0 years (0.5 ttl pk-yrs)    Types: E-cigarettes, Cigarettes    Start date: 05/16/2015    Last attempt to quit: 05/15/2017    Years since quitting: 6.1   Smokeless tobacco: Never  Vaping Use   Vaping status: Every Day   Substances: Nicotine , Flavoring  Substance and Sexual Activity  Alcohol use: Not Currently    Alcohol/week: 0.0 standard drinks of alcohol    Comment: 1/2 bottle liquor in one day for the week   Drug use: No   Sexual activity: Not Currently  Other Topics Concern   Not on file  Social History Narrative   Right handed.   Lives at home with roommates.   Caffeine occasionally - not daily.   Social Drivers of Health   Financial Resource Strain: High Risk (09/28/2022)   Overall Financial Resource Strain (CARDIA)    Difficulty of Paying Living Expenses: Hard  Food Insecurity: Food Insecurity Present (09/28/2022)   Hunger Vital Sign    Worried About Running Out of Food in the Last Year: Sometimes true    Ran Out of Food in the Last Year:  Sometimes true  Transportation Needs: No Transportation Needs (09/28/2022)   PRAPARE - Administrator, Civil Service (Medical): No    Lack of Transportation (Non-Medical): No  Physical Activity: Inactive (09/28/2022)   Exercise Vital Sign    Days of Exercise per Week: 0 days    Minutes of Exercise per Session: 0 min  Stress: Stress Concern Present (09/28/2022)   [employer] of Occupational Health - Occupational Stress Questionnaire    Feeling of Stress : Very much  Social Connections: Socially Isolated (09/28/2022)   Social Connection and Isolation Panel    Frequency of Communication with Friends and Family: More than three times a week    Frequency of Social Gatherings with Friends and Family: Once a week    Attends Religious Services: Never    Database administrator or Organizations: No    Attends Banker Meetings: Never    Marital Status: Never married  Intimate Partner Violence: Not At Risk (09/28/2022)   Humiliation, Afraid, Rape, and Kick questionnaire    Fear of Current or Ex-Partner: No    Emotionally Abused: No    Physically Abused: No    Sexually Abused: No    Review of Systems  Psychiatric/Behavioral:  Positive for depression. Negative for suicidal ideas. The patient is nervous/anxious.   All other systems reviewed and are negative.       Objective    BP (!) 133/96 (BP Location: Right Arm, Patient Position: Sitting, Cuff Size: Normal)   Pulse 73   Wt 243 lb 3.2 oz (110.3 kg)   LMP 06/14/2023 (Approximate)   SpO2 96%   BMI 38.09 kg/m   Physical Exam Vitals and nursing note reviewed.  Constitutional:      General: She is not in acute distress.    Appearance: She is obese.   Cardiovascular:     Rate and Rhythm: Normal rate and regular rhythm.  Pulmonary:     Effort: Pulmonary effort is normal.     Breath sounds: Normal breath sounds.  Abdominal:     Palpations: Abdomen is soft.     Tenderness: There is no abdominal  tenderness.   Neurological:     General: No focal deficit present.     Mental Status: She is alert and oriented to person, place, and time.         Assessment & Plan:   1. GAD (generalized anxiety disorder) Discussed compliance. Meds refilled.  - FLUoxetine  (PROZAC ) 10 MG tablet; Take 1 tablet (10 mg total) by mouth daily.  Dispense: 90 tablet; Refill: 0  2. Essential hypertension (Primary) Discussed compliance. Meds refilled.   3. Class 2 severe obesity due to excess calories with  serious comorbidity and body mass index (BMI) of 38.0 to 38.9 in adult [facility]) Patient doing well with weight loss.     Return in about 3 months (around 10/12/2023) for follow up, chronic med issues.   Tanda Raguel SQUIBB, MD

## 2023-07-18 ENCOUNTER — Other Ambulatory Visit: Payer: Self-pay

## 2023-07-20 ENCOUNTER — Other Ambulatory Visit: Payer: Self-pay

## 2023-07-25 ENCOUNTER — Telehealth: Payer: Self-pay | Admitting: Pharmacist

## 2023-07-25 ENCOUNTER — Other Ambulatory Visit: Payer: Self-pay

## 2023-07-25 ENCOUNTER — Encounter: Payer: Self-pay | Admitting: Pharmacist

## 2023-07-25 ENCOUNTER — Ambulatory Visit: Attending: Family Medicine | Admitting: Pharmacist

## 2023-07-25 DIAGNOSIS — E119 Type 2 diabetes mellitus without complications: Secondary | ICD-10-CM

## 2023-07-25 DIAGNOSIS — J029 Acute pharyngitis, unspecified: Secondary | ICD-10-CM

## 2023-07-25 DIAGNOSIS — Z7985 Long-term (current) use of injectable non-insulin antidiabetic drugs: Secondary | ICD-10-CM

## 2023-07-25 MED ORDER — TRULICITY 1.5 MG/0.5ML ~~LOC~~ SOAJ
1.5000 mg | SUBCUTANEOUS | 1 refills | Status: DC
  Filled 2023-07-25 – 2023-08-14 (×2): qty 2, 28d supply, fill #0

## 2023-07-25 MED ORDER — FREESTYLE LIBRE 3 PLUS SENSOR MISC
6 refills | Status: AC
  Filled 2023-07-25: qty 2, 30d supply, fill #0
  Filled 2023-08-31: qty 2, 30d supply, fill #1
  Filled 2023-09-27: qty 2, 30d supply, fill #2
  Filled 2023-10-24: qty 2, 30d supply, fill #3
  Filled 2023-11-27: qty 2, 30d supply, fill #4
  Filled 2023-12-29: qty 2, 30d supply, fill #5
  Filled 2024-02-10: qty 2, 30d supply, fill #6
  Filled ????-??-??: fill #1

## 2023-07-25 MED ORDER — FREESTYLE LIBRE 3 READER DEVI: 0 refills | Status: DC

## 2023-07-25 MED ORDER — FREESTYLE LIBRE 3 PLUS SENSOR MISC: 6 refills | Status: DC

## 2023-07-25 MED ORDER — FREESTYLE LIBRE 3 READER DEVI
0 refills | Status: AC
  Filled 2023-07-25: qty 1, 365d supply, fill #0
  Filled 2023-08-14: qty 1, 30d supply, fill #0

## 2023-07-25 MED ORDER — METOPROLOL SUCCINATE ER 50 MG PO TB24
50.0000 mg | ORAL_TABLET | Freq: Every day | ORAL | 0 refills | Status: DC
  Filled 2023-07-25: qty 30, 30d supply, fill #0
  Filled 2023-08-31: qty 30, 30d supply, fill #1
  Filled 2023-09-27: qty 30, 30d supply, fill #2
  Filled ????-??-??: fill #1

## 2023-07-25 NOTE — Telephone Encounter (Signed)
 Patient is only on Trulicity  but has [employer]. Are we able to start a PA for the [place] 3 plus sensors?

## 2023-07-25 NOTE — Progress Notes (Signed)
 S:     No chief complaint on file.  28 y.o. female who presents for diabetes evaluation, education, and management. PMH is significant for T2DM with hx of DKA, HTN, PCOS, MDD, obesity.   Patient was referred and last seen by Primary Care Provider, Dr. Tanda, on 07/12/2023. She was last seen by pharmacy on 06/21/2023.  At her visit with me 06/21/2023, we found that she had been without Trulicity  for a month due to lapse in insurance. She also was without her CGM supplies Medication regimen was continued. PA for Dexcom sensors was reapproved. We restarted her Trulicity . Of note, she did present to the ED on 06/14/23 due to lisinopril -induced angioedema. 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 We added amlodipine  to her regimen for improved blood pressure control. Lastly, we also sent a message to her primary team to assist in treating her increased anxiety.  Since that visit, pt was seen by Amy and Dr. Tanda at [facility] on 06/28/23 and 07/12/2023 respectively. Amy started fluoxetine  and hydroxyzine  for GAD. Dr. Tanda saw her on 07/12/2023 and increased amlodipine  to 10 mg daily.   Today, patient arrives in good spirits and presents without any assistance. She has restarted the Trulicity  and is tolerating it well on the 0.75 mg weekly dose. She denies any abdominal pain, NV. No changes in vision. She continues to exercise with her roommate. She is taking metoprolol  and amlodipine  as prescribed. Notes improved anxiety symptoms since taking fluoxetine  and prn hydroxyzine . Of note, she endorses 90-month hx of sore throat. Tells me today she has experienced longstanding issues with her tonsils. Endorses URI/strep throat infections in adolescence. Denies any shortness of breath, drainage, or cough. Denies any cervical lymphadenopathy. Requests ENT referral.   Family/Social History:  -Fhx: depression, DM, heart disease -Tobacco: does not smoke cigarettes  - vapes.  -Alcohol: none since hospitalization  Current diabetes medications include: Trulicity  0.75 mg weekly Current hypertension medications include: amlodipine  10 mg daily, metoprolol  succinate 50 mg daily Current hyperlipidemia medications include: atorvastatin  40 mg daily  Patient reports improved adherence to medications since last visit.   Insurance coverage: Cigna  Patient denies hypoglycemic events.   Patient denies nocturia (nighttime urination). Patient denies neuropathy (nerve pain).  Patient denies visual changes. Patient reports self-foot exams.   Patient reported dietary habits: Eats 3 meals/day  Breakfast: fruit (berries), protein shake, keto cereal w/ 2% milk, oatmeal on other days Lunch: varies - will sometimes not get a lunch. Salad w/ chips. Sandwhich. Dinner: largest meal (rice and/or potatoes), salad, protein  Drinks: water, Simply (Lite) Lemonade  Patient-reported exercise habits: walking with her roommate - 2 miles everyday.  BP readings at home: none  O: No CGM. Waiting on insurance approval.   Lab Results  Component Value Date   HGBA1C 9.5 (A) 06/21/2023   There were no vitals filed for this visit.  Lipid Panel     Component Value Date/Time   CHOL 124 06/21/2023 0936   TRIG 89 06/21/2023 0936   HDL 45 06/21/2023 0936   CHOLHDL 2.8 06/21/2023 0936   CHOLHDL 5.3 05/21/2017 0648   VLDL 22 05/21/2017 0648   LDLCALC 62 06/21/2023 0936    Clinical Atherosclerotic Cardiovascular Disease (ASCVD): No  The ASCVD Risk score (Arnett DK, et al., 2019) failed to calculate for the following reasons:   The 2019 ASCVD risk score is only valid for ages 92 to 62   A/P: Diabetes longstanding  currently uncontrolled with A1c of 9.5%, which has worsened from 7.1% in March 2025. Unfortunately, we do not have updated data to review today. I will resend CGM supplies as Libre 3 plus and attempt PA approval. Since she has insurance, she would qualify for the  [employer] coupon in the event that insurance does not approve the [place]. Since restarting Trulicity , her symptoms of hyperglycemia have resolved. No symptoms of hypoglycemia. Medication adherence appears to be improving with better insurance coverage.  -INCREASE Trulicity  to 1.5 mg once weekly.   -Patient educated on purpose, proper use, and potential adverse effects of Trulicity .  -Extensively discussed pathophysiology of diabetes, recommended lifestyle interventions, dietary effects on blood sugar control. Congratulated on increased physical activity.  -Facilitated request for [employer] 3+ sensors. Message sent to [facility] in pharmacy to see if PA is needed.  -Counseled on s/sx of and management of hypoglycemia.  -Patient is appropriately treated with a high intensity statin -Next A1c anticipated 09/2023   Hypertension longstanding currently above goal of < 130/80. Endorses adherence to amlodipine  and metoprolol . Will reassess BP at next visit.  -Continue amlodipine  10mg  once daily -Continue metoprolol  succinate 50 mg daily -Educated patient on s/sx of adverse effects associated with amlodipine .  -Encouraged patient to monitor blood pressure at home and to continue with exercise routine   ENT referral submitted.   Patient verbalized understanding of treatment plan.  Total time in face to face counseling 30 minutes.    Follow-up:  PCP: 10/11/2023 Pharmacist: in 1 month  Herlene Fleeta Morris, PharmD, [place], CPP Clinical Pharmacist [facility] [phone]

## 2023-08-01 ENCOUNTER — Other Ambulatory Visit: Payer: Self-pay

## 2023-08-03 ENCOUNTER — Other Ambulatory Visit: Payer: Self-pay

## 2023-08-09 ENCOUNTER — Other Ambulatory Visit: Payer: Self-pay

## 2023-08-11 ENCOUNTER — Other Ambulatory Visit: Payer: Self-pay

## 2023-08-12 ENCOUNTER — Other Ambulatory Visit: Payer: Self-pay

## 2023-08-15 ENCOUNTER — Other Ambulatory Visit: Payer: Self-pay

## 2023-08-17 ENCOUNTER — Other Ambulatory Visit: Payer: Self-pay

## 2023-08-26 ENCOUNTER — Telehealth: Payer: Self-pay | Admitting: Family Medicine

## 2023-08-26 NOTE — Telephone Encounter (Signed)
 Confirmed appt for 8/18

## 2023-08-29 ENCOUNTER — Ambulatory Visit: Attending: Family Medicine | Admitting: Pharmacist

## 2023-08-29 ENCOUNTER — Other Ambulatory Visit: Payer: Self-pay

## 2023-08-29 ENCOUNTER — Encounter: Payer: Self-pay | Admitting: Pharmacist

## 2023-08-29 VITALS — BP 129/88 | HR 84

## 2023-08-29 DIAGNOSIS — E119 Type 2 diabetes mellitus without complications: Secondary | ICD-10-CM

## 2023-08-29 DIAGNOSIS — Z7984 Long term (current) use of oral hypoglycemic drugs: Secondary | ICD-10-CM

## 2023-08-29 DIAGNOSIS — Z7985 Long-term (current) use of injectable non-insulin antidiabetic drugs: Secondary | ICD-10-CM | POA: Diagnosis not present

## 2023-08-29 MED ORDER — TRULICITY 3 MG/0.5ML ~~LOC~~ SOAJ
3.0000 mg | SUBCUTANEOUS | 1 refills | Status: DC
  Filled 2023-08-29 – 2023-09-11 (×2): qty 2, 28d supply, fill #0
  Filled 2023-10-14: qty 2, 28d supply, fill #1

## 2023-08-29 MED ORDER — INSULIN GLARGINE-YFGN 100 UNIT/ML ~~LOC~~ SOPN
10.0000 [IU] | PEN_INJECTOR | Freq: Every day | SUBCUTANEOUS | 2 refills | Status: DC
  Filled 2023-08-29: qty 3, 30d supply, fill #0
  Filled 2023-09-27: qty 3, 30d supply, fill #1
  Filled 2023-10-23: qty 3, 30d supply, fill #2

## 2023-08-29 NOTE — Progress Notes (Signed)
 S:     No chief complaint on file.  28 y.o. female who presents for diabetes evaluation, education, and management. PMH is significant for T2DM with hx of DKA, HTN, PCOS, MDD, obesity.   Patient was referred and last seen by Primary Care Provider, Dr. Tanda. She last saw Dr. Vicci on 07/12/23. She last saw pharmacy on 07/25/23. At last visit, Trulicity  was increased to 1.5 mg weekly and started on Freestyle Libre 3+ sensors.  Today, patient arrives in good spirits and presents without any assistance. She states that she has been enjoying using the [place] 3 Plus sensor. The first sensor that was put on fell off by itself and she placed her other sensor. She endorses continuation in exercising and increased walking. Diet has changed as it is summertime. States that she has been eating bags of cherries with increase in blood sugars. No change in other eating habits, but would like a referral to a dietician to help with better eating.   Family/Social History:  -Fhx: depression, DM, heart disease -Tobacco: does not smoke cigarettes - states that she stopped vaping and her sore throat has gone away! -Alcohol: none since hospitalization  Current diabetes medications include: Trulicity  1.5 mg once weekly Current hypertension medications include: amlodipine  10 mg daily, metoprolol  succinate 50 mg daily  Current hyperlipidemia medications include: atorvastatin  40 mg daily   Patient reports adherence to taking all medications as prescribed since last visit.   Insurance coverage: Cigna Managed  Patient denies hypoglycemic events.  Patient denies nocturia (nighttime urination).  Patient denies neuropathy (nerve pain). Patient denies visual changes. Patient reports self foot exams.   Patient reported dietary habits: Eats 3 meals/day Breakfast: fruit (berries), protein shake, keto cereal w/ 2% milk, oatmeal on other days Lunch: varies - will sometimes not get a lunch. Salad w/ chips.  Sandwhich. Dinner: largest meal (rice and/or potatoes), salad, protein  Drinks: water, Simply (Lite) Lemonade  Patient-reported exercise habits: walking  O:   ROS  Physical Exam  Libre3 CGM Download today 8/18 for 28 day report % Time CGM is active: 69% Average Glucose: 365 mg/dL Glucose Management Indicator: 12%  Glucose Variability: 11.2% (goal <36%) Time in Goal:  - Time in range 70-180: 0% - Time above range: 100% - Time below range: 0% Observed patterns:   Lab Results  Component Value Date   HGBA1C 9.5 (A) 06/21/2023   Vitals:   08/29/23 0900  BP: 129/88  Pulse: 84    Lipid Panel     Component Value Date/Time   CHOL 124 06/21/2023 0936   TRIG 89 06/21/2023 0936   HDL 45 06/21/2023 0936   CHOLHDL 2.8 06/21/2023 0936   CHOLHDL 5.3 05/21/2017 0648   VLDL 22 05/21/2017 0648   LDLCALC 62 06/21/2023 0936    Clinical Atherosclerotic Cardiovascular Disease (ASCVD): No  The ASCVD Risk score (Arnett DK, et al., 2019) failed to calculate for the following reasons:   The 2019 ASCVD risk score is only valid for ages 76 to 47   Patient is participating in a Managed Medicaid Plan:  Yes, Cigna Managed  A/P: Diabetes longstanding currently uncontrolled with A1c 9.5%, which is up from 7.1% in March 2025. Patient has been adherent to her medication. Denies any N/V with increased Trulicity  dose. Patient was able to provide Libre readings, but not a full report as sensors have fallen off. Avg BG was around 200s. In consideration to this, basal insulin  will be added and increase Trulicity  to 3 mg once  weekly. With the addition of insulin  and increase of Trulicity , hopefully blood sugars will come down and get under control. Patient is able to verbalize appropriate hypoglycemia management plan.  -Initiate basal insulin  Semglee  (insulin  glargine) 10 units daily in the morning.  -Increased dose of GLP-1 Trulicity  (dulaglutide ) from 1.5 mg to 3 mg .  -Patient educated on purpose,  proper use, and potential adverse effects of Trulicity  and Semglee .  -Extensively discussed pathophysiology of diabetes, recommended lifestyle interventions, dietary effects on blood sugar control.  -Counseled on s/sx of and management of hypoglycemia.  -Next A1c anticipated 09/2023.   Written patient instructions provided. Patient verbalized understanding of treatment plan.  Total time in face to face counseling 30 minutes.    Follow-up:  Pharmacist on 10/10/23 PCP clinic visit in 10/11/23  Jenkins Graces, PharmD PGY1 Pharmacy Resident [phone]

## 2023-08-29 NOTE — Progress Notes (Deleted)
 Date of Download: 8/18 - 4 week report % Time CGM is active: 2 sensors fell off during this time Average Glucose: 371 mg/dL Glucose Management Indicator: 12.2  Time in Goal:  - Time in range 70-180: 0% - Time above range: 100% - Time below range: 0% Observed patterns:

## 2023-08-31 ENCOUNTER — Other Ambulatory Visit: Payer: Self-pay

## 2023-09-11 ENCOUNTER — Other Ambulatory Visit: Payer: Self-pay

## 2023-09-15 ENCOUNTER — Other Ambulatory Visit: Payer: Self-pay

## 2023-09-27 ENCOUNTER — Other Ambulatory Visit: Payer: Self-pay

## 2023-10-04 ENCOUNTER — Ambulatory Visit: Admitting: Skilled Nursing Facility1

## 2023-10-07 ENCOUNTER — Telehealth: Payer: Self-pay | Admitting: Family Medicine

## 2023-10-07 NOTE — Telephone Encounter (Signed)
 Confirmed appt for 9/29

## 2023-10-09 NOTE — Progress Notes (Deleted)
 S:     No chief complaint on file.  28 y.o. female who presents for diabetes evaluation, education, and management. PMH is significant for T2DM with hx of DKA, HTN, PCOS, MDD, obesity.   Patient was referred and last seen by Primary Care Provider, Dr. Tanda. She last saw Dr. Vicci on 07/12/23. Patient has been followed by pharmacy for some time. Last pharmacy visit on 08/29/23. Patient reported lifestyle changes in exercise. Patient requested seeing a dietician for management of her diet. Per CGM report, she was experiencing elevated BG readings. Semglee  10 units was initiated in addition to increasing her Trulicity  to 3 mg.   Today, patient arrives in good spirits and presents without any assistance.  Due for A1c  Tolerating medications? Side effects with insulin ? Hypoglycemia? Side effects of 3 mg? Review libre report  Plan: Increase semglee  based on libre report   Family/Social History:  -Fhx: depression, DM, heart disease -Tobacco: does not smoke cigarettes - states that she stopped vaping and her sore throat has gone away! -Alcohol: none since hospitalization  Current diabetes medications include: Semglee  10 units daily, Trulicity  3 mg once weekly Current hypertension medications include: amlodipine  10 mg daily, metoprolol  succinate 50 mg daily  Current hyperlipidemia medications include: atorvastatin  40 mg daily   Patient reports adherence to taking all medications as prescribed since last visit.   Insurance coverage: Cigna Managed  Patient denies hypoglycemic events.  Patient denies nocturia (nighttime urination).  Patient denies neuropathy (nerve pain). Patient denies visual changes. Patient reports self foot exams.   Patient reported dietary habits: Eats 3 meals/day Breakfast: fruit (berries), protein shake, keto cereal w/ 2% milk, oatmeal on other days Lunch: varies - will sometimes not get a lunch. Salad w/ chips. Sandwhich. Dinner: largest meal (rice and/or  potatoes), salad, protein  Drinks: water, Simply (Lite) Lemonade  Patient-reported exercise habits: walking  O:   ROS  Physical Exam  Libre3 CGM Download today *** for ***  day report % Time CGM is active: ***% Average Glucose: *** mg/dL Glucose Management Indicator: ***%  Glucose Variability: ***% (goal <36%) Time in Goal:  - Time in range 70-180: ***% - Time above range: ***% - Time below range: ***% Observed patterns:   Lab Results  Component Value Date   HGBA1C 9.5 (A) 06/21/2023   There were no vitals filed for this visit.   Lipid Panel     Component Value Date/Time   CHOL 124 06/21/2023 0936   TRIG 89 06/21/2023 0936   HDL 45 06/21/2023 0936   CHOLHDL 2.8 06/21/2023 0936   CHOLHDL 5.3 05/21/2017 0648   VLDL 22 05/21/2017 0648   LDLCALC 62 06/21/2023 0936    Clinical Atherosclerotic Cardiovascular Disease (ASCVD): No  The ASCVD Risk score (Arnett DK, et al., 2019) failed to calculate for the following reasons:   The 2019 ASCVD risk score is only valid for ages 69 to 11   Patient is participating in a Managed Medicaid Plan:  Yes, C[employer]a Managed  A/P: Diabetes longstanding currently uncontrolled with A1c ***%. -Initiate basal insulin  Semglee  (insulin  glargine) 10 units daily in the morning.  -Increased dose of GLP-1 Trulicity  (dulaglutide ) from 1.5 mg to 3 mg .  -Patient educated on purpose, proper use, and potential adverse effects of Trulicity  and Semglee .  -Extensively discussed pathophysiology of diabetes, recommended lifestyle interventions, dietary effects on blood sugar control.  -Counseled on s/sx of and management of hypoglycemia.  -Next A1c anticipated 09/2023.   Written patient instructions provided. Patient verbalized  understanding of treatment plan.  Total time in face to face counseling 30 minutes.    Follow-up:  Pharmacist on 10/10/23 PCP clinic visit in 10/11/23  Jenkins Graces, PharmD PGY1 Pharmacy Resident [phone]

## 2023-10-10 ENCOUNTER — Ambulatory Visit: Admitting: Pharmacist

## 2023-10-11 ENCOUNTER — Ambulatory Visit (INDEPENDENT_AMBULATORY_CARE_PROVIDER_SITE_OTHER): Admitting: Family Medicine

## 2023-10-11 VITALS — BP 121/87 | HR 77 | Ht 67.0 in | Wt 236.6 lb

## 2023-10-11 DIAGNOSIS — E1169 Type 2 diabetes mellitus with other specified complication: Secondary | ICD-10-CM

## 2023-10-11 DIAGNOSIS — Z794 Long term (current) use of insulin: Secondary | ICD-10-CM | POA: Diagnosis not present

## 2023-10-11 DIAGNOSIS — I10 Essential (primary) hypertension: Secondary | ICD-10-CM

## 2023-10-11 DIAGNOSIS — E785 Hyperlipidemia, unspecified: Secondary | ICD-10-CM | POA: Diagnosis not present

## 2023-10-11 DIAGNOSIS — Z6837 Body mass index (BMI) 37.0-37.9, adult: Secondary | ICD-10-CM

## 2023-10-11 DIAGNOSIS — E66812 Obesity, class 2: Secondary | ICD-10-CM

## 2023-10-11 LAB — POCT GLYCOSYLATED HEMOGLOBIN (HGB A1C): HbA1c, POC (controlled diabetic range): 13.7 % — AB (ref 0.0–7.0)

## 2023-10-11 NOTE — Progress Notes (Signed)
 Established Patient Office Visit  Subjective    Patient ID: Stacy Wang, female    DOB: 02-24-1995  Age: 28 y.o. MRN: [id number]  CC:  Chief Complaint  Patient presents with   Medical Management of Chronic Issues    Needs referral for diabetic eye exam     HPI Stacy Wang presents for follow up of chronic med issues including diabetes and hypertension. Patient denies acute complaints.   Outpatient Encounter Medications as of 10/11/2023  Medication Sig   Accu-Chek Softclix Lancets lancets Use to check blood sugar 3 times daily.   albuterol  (VENTOLIN  HFA) 108 (90 Base) MCG/ACT inhaler Inhale 1-2 puffs into the lungs every 6 (six) hours as needed for wheezing or shortness of breath.   amLODipine  (NORVASC ) 10 MG tablet Take 1 tablet (10 mg total) by mouth daily.   atorvastatin  (LIPITOR) 40 MG tablet Take 1 tablet (40 mg total) by mouth daily.   Blood Glucose Monitoring Suppl (ACCU-CHEK GUIDE) w/Device KIT Use to check blood sugar 3 times daily.   Continuous Glucose Receiver (FREESTYLE LIBRE 3 READER) DEVI Change sensor every 15 days. Use to check blood sugar continuously.   Continuous Glucose Sensor (FREESTYLE LIBRE 3 PLUS SENSOR) MISC Change sensor every 15 days. Use to check blood sugar continuously.   Dulaglutide  (TRULICITY ) 3 MG/0.5ML SOAJ Inject 3 mg into the skin once a week.   FLUoxetine  (PROZAC ) 10 MG tablet Take 1 tablet (10 mg total) by mouth daily.   glucose blood (ACCU-CHEK GUIDE TEST) test strip Use to check blood sugar 3 times daily.   hydrOXYzine  (VISTARIL ) 25 MG capsule Take 1 capsule (25 mg total) by mouth every 8 (eight) hours as needed.   insulin  glargine-yfgn (SEMGLEE ) 100 UNIT/ML Pen Inject 10 Units into the skin daily.   metoprolol  succinate (TOPROL -XL) 50 MG 24 hr tablet Take 1 tablet (50 mg total) by mouth daily. Take with or immediately following a meal.   norgestimate -ethinyl estradiol  (MILI ) 0.25-35 MG-MCG tablet Take 1 tablet by mouth daily.   No  facility-administered encounter medications on file as of 10/11/2023.    Past Medical History:  Diagnosis Date   Bell palsy    Bell's palsy    Diabetes mellitus without complication (HCC)    type 2   Hypertension    Migraine     Past Surgical History:  Procedure Laterality Date   WISDOM TOOTH EXTRACTION      Family History  Problem Relation Age of Onset   Depression Mother    Diabetes Father    Heart disease Father     Social History   Socioeconomic History   Marital status: Single    Spouse name: Not on file   Number of children: 0   Years of education: Some colle   Highest education level: 12th grade  Occupational History   Occupation: Personal assistant - customer service  Tobacco Use   Smoking status: Some Days    Current packs/day: 0.00    Average packs/day: 0.3 packs/day for 2.0 years (0.5 ttl pk-yrs)    Types: E-cigarettes, Cigarettes    Start date: 05/16/2015    Last attempt to quit: 05/15/2017    Years since quitting: 6.4   Smokeless tobacco: Never  Vaping Use   Vaping status: Every Day   Substances: Nicotine , Flavoring  Substance and Sexual Activity   Alcohol use: Not Currently    Alcohol/week: 0.0 standard drinks of alcohol    Comment: 1/2 bottle liquor in one day for the week  Drug use: No   Sexual activity: Not Currently  Other Topics Concern   Not on file  Social History Narrative   Right handed.   Lives at home with roommates.   Caffeine occasionally - not daily.   Social Drivers of Health   Financial Resource Strain: Medium Risk (10/11/2023)   Overall Financial Resource Strain (CARDIA)    Difficulty of Paying Living Expenses: Somewhat hard  Food Insecurity: No Food Insecurity (10/11/2023)   Hunger Vital Sign    Worried About Running Out of Food in the Last Year: Never true    Ran Out of Food in the Last Year: Never true  Transportation Needs: No Transportation Needs (10/11/2023)   PRAPARE - Administrator, Civil Service (Medical): No     Lack of Transportation (Non-Medical): No  Physical Activity: Insufficiently Active (10/11/2023)   Exercise Vital Sign    Days of Exercise per Week: 3 days    Minutes of Exercise per Session: 20 min  Stress: Stress Concern Present (10/11/2023)   [employer] of Occupational Health - Occupational Stress Questionnaire    Feeling of Stress: To some extent  Social Connections: Socially Isolated (10/11/2023)   Social Connection and Isolation Panel    Frequency of Communication with Friends and Family: Once a week    Frequency of Social Gatherings with Friends and Family: More than three times a week    Attends Religious Services: Never    Database administrator or Organizations: No    Attends Engineer, structural: Not on file    Marital Status: Never married  Intimate Partner Violence: Not At Risk (09/28/2022)   Humiliation, Afraid, Rape, and Kick questionnaire    Fear of Current or Ex-Partner: No    Emotionally Abused: No    Physically Abused: No    Sexually Abused: No    Review of Systems  All other systems reviewed and are negative.       Objective    BP 121/87   Pulse 77   Ht 5' 7 (1.702 m)   Wt 236 lb 9.6 oz (107.3 kg)   LMP 10/07/2023 (Approximate)   SpO2 97%   BMI 37.06 kg/m   Physical Exam Vitals and nursing note reviewed.  Constitutional:      General: She is not in acute distress.    Appearance: She is obese.  Cardiovascular:     Rate and Rhythm: Normal rate and regular rhythm.  Pulmonary:     Effort: Pulmonary effort is normal.     Breath sounds: Normal breath sounds.  Abdominal:     Palpations: Abdomen is soft.     Tenderness: There is no abdominal tenderness.  Neurological:     General: No focal deficit present.     Mental Status: She is alert and oriented to person, place, and time.         Assessment & Plan:  1. Type 2 diabetes mellitus with other specified complication, with long-term current use of insulin  (HCC) (Primary) A1c is  increased and above goal. Referral to [facility] for med management. Discussed compliance.  - POCT glycosylated hemoglobin (Hb A1C)  2. Essential hypertension Appears stable. Continue   3. Hyperlipidemia, unspecified hyperlipidemia type Continue   4. Class 2 severe obesity due to excess calories with serious comorbidity and body mass index (BMI) of 37.0 to 37.9 in adult     Return in about 3 months (around 01/10/2024) for follow up.   Tanda Raguel SQUIBB, MD

## 2023-10-12 ENCOUNTER — Ambulatory Visit: Payer: Self-pay | Admitting: Family Medicine

## 2023-10-14 ENCOUNTER — Other Ambulatory Visit: Payer: Self-pay

## 2023-10-23 ENCOUNTER — Other Ambulatory Visit: Payer: Self-pay | Admitting: Family Medicine

## 2023-10-24 ENCOUNTER — Other Ambulatory Visit: Payer: Self-pay

## 2023-10-24 ENCOUNTER — Other Ambulatory Visit (HOSPITAL_COMMUNITY): Payer: Self-pay

## 2023-10-24 MED ORDER — METOPROLOL SUCCINATE ER 50 MG PO TB24
50.0000 mg | ORAL_TABLET | Freq: Every day | ORAL | 1 refills | Status: AC
Start: 2023-10-24 — End: ?
  Filled 2023-10-24: qty 90, 90d supply, fill #0
  Filled 2023-10-24: qty 30, 30d supply, fill #0
  Filled 2024-02-10: qty 90, 90d supply, fill #1

## 2023-10-24 MED ORDER — AMLODIPINE BESYLATE 10 MG PO TABS
10.0000 mg | ORAL_TABLET | Freq: Every day | ORAL | 0 refills | Status: DC
  Filled 2023-10-24: qty 30, 30d supply, fill #0
  Filled 2023-11-27: qty 30, 30d supply, fill #1
  Filled 2023-12-29: qty 30, 30d supply, fill #2

## 2023-10-25 ENCOUNTER — Other Ambulatory Visit: Payer: Self-pay

## 2023-10-26 ENCOUNTER — Other Ambulatory Visit: Payer: Self-pay

## 2023-10-27 ENCOUNTER — Other Ambulatory Visit: Payer: Self-pay

## 2023-11-01 ENCOUNTER — Other Ambulatory Visit: Payer: Self-pay

## 2023-11-13 NOTE — Progress Notes (Unsigned)
 S:     No chief complaint on file.  28 y.o. female who presents for diabetes evaluation, education, and management. PMH is significant for T2DM with hx of DKA, HTN, PCOS, MDD, obesity.   Patient was referred and last seen by Primary Care Provider, Dr. Tanda, on 10/11/23. At that visit, patient's A1c was elevated at 13.7% (up from 9.5% in June of this year).  Her last pharmacy visit was on 08/29/23. With A1c at the time, patient was initiated on Semglee  10 units and Trulcity was increased. Patient did pick up Semglee  after visit with pharmacy, but her A1c in September showed worsening glycemic control. Of note, patient has not picked up Semglee  since 09/27/23.  Today, Stacy Wang arrives in good spirits and presents without any assistance. Her Libreview AGP report is listed below. She has been unable to fill Semglee  due to nationwide backorder. She is taking Trulicity . Denies any NV, abdominal pain. No changes in vision. She is also exercising more.   Family/Social History:  -Fhx: depression, DM, heart disease -Tobacco: does not smoke cigarettes  -Alcohol: none since hospitalization  Current diabetes medications include:  Trulicity  3 mg once weekly Semglee  10 units once daily Current hypertension medications include:  Amlodipine  10 mg daily Metoprolol  succinate 50 mg daily  Current hyperlipidemia medications include:  Atorvastatin  40 mg daily   Patient reports adherence to taking all medications with the exception of Semglee .   Insurance coverage: [employer] Managed  Patient denies hypoglycemic events.  Patient denies nocturia (nighttime urination).  Patient denies neuropathy (nerve pain). Patient denies visual changes. Patient reports self foot exams.   Patient reported dietary habits: Eats 3 meals/day Breakfast: fruit (berries), protein shake, keto cereal w/ 2% milk, oatmeal on other days Lunch: varies - will sometimes not get a lunch. Salad w/ chips. Sandwhich. Dinner: largest  meal (rice and/or potatoes), salad, protein  Drinks: water, Simply (Lite) Lemonade  Patient-reported exercise habits: walking  O:   ROS  Physical Exam  Libre3 CGM Download: 11/14/2023 for 28 day report % Time CGM is active: 53% Average Glucose: 217 mg/dL (down from 634 mg/dL in Aug). Glucose Management Indicator: 8.5% (down from 12% in Aug). Glucose Variability: 24.1% (goal <36%) Time in Goal:  - Time in range 70-180: 26% (up from 0% in Aug) - Time above range: 74 (down from 100% in Aug) - Time below range: 0% Observed patterns:   Lab Results  Component Value Date   HGBA1C 13.7 (A) 10/11/2023   There were no vitals filed for this visit.   Lipid Panel     Component Value Date/Time   CHOL 124 06/21/2023 0936   TRIG 89 06/21/2023 0936   HDL 45 06/21/2023 0936   CHOLHDL 2.8 06/21/2023 0936   CHOLHDL 5.3 05/21/2017 0648   VLDL 22 05/21/2017 0648   LDLCALC 62 06/21/2023 0936    Clinical Atherosclerotic Cardiovascular Disease (ASCVD): No  The ASCVD Risk score (Arnett DK, et al., 2019) failed to calculate for the following reasons:   The 2019 ASCVD risk score is only valid for ages 53 to 74   Patient is participating in a Managed Medicaid Plan:  Yes, Cigna Managed  A/P: Diabetes longstanding currently uncontrolled with A1c 13.7%. However, her AGP report shows better home glycemic control compared to August. She is tolerating Trulicity  okay but went without her insulin  due to a psychiatrist. I will send Lantus  to replace and have advised her to take 10 units daily. Patient is able to verbalize appropriate hypoglycemia  management plan.  -Initiate Lantus  10 units daily in the morning.  -Continue Trulicity  3 mg weekly.  -Patient educated on purpose, proper use, and potential adverse effects of Trulicity  and Lantus .  -Extensively discussed pathophysiology of diabetes, recommended lifestyle interventions, dietary effects on blood sugar control.  -Counseled on s/sx of  and management of hypoglycemia.  -Next A1c anticipated 12/2023.   Written patient instructions provided. Patient verbalized understanding of treatment plan.  Total time in face to face counseling 30 minutes.    Follow-up:  Pharmacist in 2 months. PCP clinic visit in December.  Stacy Wang, PharmD, Stacy Wang, CPP Clinical Pharmacist [facility] [phone]

## 2023-11-14 ENCOUNTER — Ambulatory Visit: Payer: Self-pay | Attending: Family Medicine | Admitting: Pharmacist

## 2023-11-14 ENCOUNTER — Encounter: Payer: Self-pay | Admitting: Pharmacist

## 2023-11-14 ENCOUNTER — Other Ambulatory Visit: Payer: Self-pay

## 2023-11-14 DIAGNOSIS — E119 Type 2 diabetes mellitus without complications: Secondary | ICD-10-CM

## 2023-11-14 DIAGNOSIS — Z794 Long term (current) use of insulin: Secondary | ICD-10-CM | POA: Diagnosis not present

## 2023-11-14 DIAGNOSIS — Z7985 Long-term (current) use of injectable non-insulin antidiabetic drugs: Secondary | ICD-10-CM | POA: Diagnosis not present

## 2023-11-14 MED ORDER — LANTUS SOLOSTAR 100 UNIT/ML ~~LOC~~ SOPN
10.0000 [IU] | PEN_INJECTOR | Freq: Every day | SUBCUTANEOUS | 2 refills | Status: AC
  Filled 2023-11-14: qty 3, 28d supply, fill #0
  Filled 2023-12-29: qty 3, 28d supply, fill #1

## 2023-11-14 MED ORDER — TRULICITY 3 MG/0.5ML ~~LOC~~ SOAJ
3.0000 mg | SUBCUTANEOUS | 1 refills | Status: AC
  Filled 2023-11-14: qty 2, 28d supply, fill #0
  Filled 2023-12-29: qty 2, 28d supply, fill #1
  Filled 2024-02-10 – 2024-02-13 (×3): qty 2, 28d supply, fill #2

## 2023-11-15 ENCOUNTER — Encounter: Payer: Self-pay | Admitting: Skilled Nursing Facility1

## 2023-11-15 ENCOUNTER — Encounter: Attending: Family Medicine | Admitting: Skilled Nursing Facility1

## 2023-11-15 ENCOUNTER — Other Ambulatory Visit: Payer: Self-pay

## 2023-11-15 VITALS — Ht 67.0 in | Wt 237.1 lb

## 2023-11-15 DIAGNOSIS — E119 Type 2 diabetes mellitus without complications: Secondary | ICD-10-CM | POA: Insufficient documentation

## 2023-11-15 NOTE — Progress Notes (Signed)
 Medical Nutrition Therapy  Appointment Start time:  11:10  Appointment End time:  12:06  Primary concerns today: weight and DM management   Referral diagnosis: e11.9  NUTRITION ASSESSMENT   Clinical Medical Hx: DM type 2, PCOS, depression  Medications: see list: Trulicity   Labs: A1C 13.7 Notable Signs/Symptoms: none reported   Lifestyle & Dietary Hx  Body Composition Scale 11/15/2023  Current Body Weight 237.1  Total Body Fat % 40.7  Visceral Fat 10  Fat-Free Mass % 59.2   Total Body Water % 44.1  Muscle-Mass lbs 35.5  BMI 36.8  Body Fat Displacement          Torso  lbs 59.9         Left Leg  lbs 11.9         Right Leg  lbs 11.9         Left Arm  lbs 5.9         Right Arm   lbs 5.9   Pt states she is an emotional eater. Pt states she has been going to the gym 3-4 days a week about 60 minutes.  Pt states she works with her therapist every other day.  Pt states she does have a history of eating disorders anorexia and binge eating.  Pt states DM management is important to her.   Pt states she will not eat cauliflower, raw apples, almonds.  Pt states she Works 4 days a week Pt states change can be a trigger for her.   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    Estimated daily fluid intake:  oz Supplements:  Sleep:  Stress / self-care:  Current average weekly physical activity: ADL's  24-Hr Dietary Recall First Meal:  Snack:  Second Meal:  Snack:  Third Meal: fast food  Snack:  Beverages:    NUTRITION INTERVENTION  Nutrition education (E-1) on the following topics:  Creation of balanced and diverse meals to increase the intake of nutrient-rich foods that provide essential vitamins, minerals, fiber, and phytonutrients Variety of Fruits and Vegetables: Aim for a colorful array of fruits and vegetables to ensure a wide range of nutrients. Include a mix of leafy greens, berries, citrus fruits, cruciferous  vegetables, and more. Whole Grains: Choose whole grains over refined grains. Examples include brown rice, quinoa, oats, whole wheat, and barley. Lean Proteins: Include lean sources of protein, such as poultry, fish, tofu, legumes, beans, lentils, and low-fat dairy products. Limit red and processed meats. Healthy Fats: Incorporate sources of healthy fats, including avocados, nuts, seeds, and olive oil. Limit saturated and trans fats found in fried and processed foods. Dairy or Dairy Alternatives: Choose low-fat or fat-free dairy products, or plant-based alternatives like almond or soy milk. Portion Control: Be mindful of portion sizes to avoid overeating. Pay attention to hunger and satisfaction cues. Limit Added Sugars: Minimize the consumption of sugary beverages, snacks, and desserts. Check food labels for added sugars and opt for natural sources of sweetness such as whole fruits. Hydration: Drink plenty of water throughout the day. Limit sugary drinks and excessive caffeine intake. Moderate Sodium Intake: Reduce the consumption of high-sodium foods. Use herbs and spices for flavor instead of excessive salt. Meal Planning and Preparation: Plan and prepare meals ahead of time to make healthier choices more convenient. Include a mix of food groups in each meal. Limit Processed Foods: Minimize the intake of highly processed and packaged foods that are often high in  added sugars, salt, and unhealthy fats. Regular Physical Activity: Combine a healthy diet with regular physical activity for overall well-being. Aim for at least 150 minutes of moderate-intensity aerobic exercise per week, along with strength training. Moderation and Balance: Enjoy treats and indulgent foods in moderation, emphasizing balance rather than strict restriction.  Handouts Provided Include  Detailed MyPlate Mindful Meals  Learning Style & Readiness for Change Teaching method utilized: Visual & Auditory   Demonstrated degree of understanding via: Teach Back  Barriers to learning/adherence to lifestyle change: emotional eating  Goals Established by Pt Charge your phone outside of your room, try in your office on workdays  4 days a week:  Breakfast: Egg and bacon and tomato sandwich by 9:30am Lunch: By 2:30pm: Soup or sandwich with salad + handful chips Dinner: By 6:30 pm: ravioli and salad  Split up your workday   MONITORING & EVALUATION Dietary intake, weekly physical activity  Next Steps  Patient is to return in 4 weeks.

## 2023-11-17 ENCOUNTER — Other Ambulatory Visit (HOSPITAL_COMMUNITY): Payer: Self-pay

## 2023-11-23 ENCOUNTER — Other Ambulatory Visit: Payer: Self-pay

## 2023-11-27 ENCOUNTER — Other Ambulatory Visit: Payer: Self-pay | Admitting: Family Medicine

## 2023-11-27 ENCOUNTER — Other Ambulatory Visit: Payer: Self-pay | Admitting: Family

## 2023-11-27 DIAGNOSIS — F411 Generalized anxiety disorder: Secondary | ICD-10-CM

## 2023-11-28 ENCOUNTER — Other Ambulatory Visit: Payer: Self-pay

## 2023-11-28 MED ORDER — HYDROXYZINE PAMOATE 25 MG PO CAPS
25.0000 mg | ORAL_CAPSULE | Freq: Three times a day (TID) | ORAL | 2 refills | Status: AC | PRN
  Filled 2023-11-28: qty 30, 10d supply, fill #0
  Filled 2024-02-10: qty 30, 10d supply, fill #1

## 2023-11-28 NOTE — Telephone Encounter (Signed)
 Complete. Please send future refills to patient's primary provider Raguel Blush, MD.

## 2023-11-30 ENCOUNTER — Other Ambulatory Visit: Payer: Self-pay

## 2023-11-30 MED ORDER — FLUOXETINE HCL 10 MG PO TABS
10.0000 mg | ORAL_TABLET | Freq: Every day | ORAL | 0 refills | Status: AC
  Filled 2023-11-30: qty 30, 30d supply, fill #0
  Filled 2023-12-29: qty 30, 30d supply, fill #1
  Filled 2024-02-10: qty 30, 30d supply, fill #2

## 2023-12-01 ENCOUNTER — Other Ambulatory Visit: Payer: Self-pay

## 2023-12-29 ENCOUNTER — Other Ambulatory Visit: Payer: Self-pay

## 2023-12-30 ENCOUNTER — Other Ambulatory Visit: Payer: Self-pay

## 2024-01-10 ENCOUNTER — Encounter: Admitting: Skilled Nursing Facility1

## 2024-01-10 ENCOUNTER — Encounter: Payer: Self-pay | Admitting: Skilled Nursing Facility1

## 2024-01-10 DIAGNOSIS — E119 Type 2 diabetes mellitus without complications: Secondary | ICD-10-CM | POA: Diagnosis present

## 2024-01-10 NOTE — Progress Notes (Signed)
 Medical Nutrition Therapy  Appointment Start time:  11:33  Appointment End time:  12:05  Primary concerns today: weight and DM management   Referral diagnosis: e11.9  NUTRITION ASSESSMENT   Clinical Medical Hx: DM type 2, PCOS, depression  Medications: see list: Trulicity   Labs: A1C 13.7 Notable Signs/Symptoms: none reported   Lifestyle & Dietary Hx  Body Composition Scale 11/15/2023  Current Body Weight 237.1  Total Body Fat % 40.7  Visceral Fat 10  Fat-Free Mass % 59.2   Total Body Water % 44.1  Muscle-Mass lbs 35.5  BMI 36.8  Body Fat Displacement          Torso  lbs 59.9         Left Leg  lbs 11.9         Right Leg  lbs 11.9         Left Arm  lbs 5.9         Right Arm   lbs 5.9   Pt arrives having reached all of her goals from previous visit! Pt states she does eat throughout the day and has been eating carbs and sees where this Is helping her sugar control and can feel it now when she does not regularly. Pt states she does use her Libre.  Pt states she did reconfigure her room leading to great healthy habits. Pt sates she no longer eats in her room at night.  Pt states she typically wakes around 7am. Pt states she is trying to change her work schedule.  Pt states her roommate is on the same health journey so she is a good support (Jasmine).   CGM: 30 days average glucose 161, TIR 73%, 24% high, GMI 7.2  Estimated daily fluid intake:  oz Supplements:  Sleep:  Stress / self-care:  Current average weekly physical activity: 10-15 minutes treadmill and then weights   24-Hr Dietary Recall First Meal: skipped or eggs + toast + bacon or pancakes  Snack:  Second Meal: grilled cheese and soup or skipped Snack:  Third Meal: bag salad + cucumber + bag salad or spagetti with salad  Snack:  Beverages:    NUTRITION INTERVENTION  Nutrition education (E-1) on the following topics: Continued Creation of balanced and diverse meals to increase the intake of nutrient-rich  foods that provide essential vitamins, minerals, fiber, and phytonutrients Variety of Fruits and Vegetables: Aim for a colorful array of fruits and vegetables to ensure a wide range of nutrients. Include a mix of leafy greens, berries, citrus fruits, cruciferous vegetables, and more. Whole Grains: Choose whole grains over refined grains. Examples include brown rice, quinoa, oats, whole wheat, and barley. Lean Proteins: Include lean sources of protein, such as poultry, fish, tofu, legumes, beans, lentils, and low-fat dairy products. Limit red and processed meats. Healthy Fats: Incorporate sources of healthy fats, including avocados, nuts, seeds, and olive oil. Limit saturated and trans fats found in fried and processed foods. Dairy or Dairy Alternatives: Choose low-fat or fat-free dairy products, or plant-based alternatives like almond or soy milk. Portion Control: Be mindful of portion sizes to avoid overeating. Pay attention to hunger and satisfaction cues. Limit Added Sugars: Minimize the consumption of sugary beverages, snacks, and desserts. Check food labels for added sugars and opt for natural sources of sweetness such as whole fruits. Hydration: Drink plenty of water throughout the day. Limit sugary drinks and excessive caffeine intake. Moderate Sodium Intake: Reduce the consumption of high-sodium foods. Use herbs and spices for flavor instead of  excessive salt. Meal Planning and Preparation: Plan and prepare meals ahead of time to make healthier choices more convenient. Include a mix of food groups in each meal. Limit Processed Foods: Minimize the intake of highly processed and packaged foods that are often high in added sugars, salt, and unhealthy fats. Regular Physical Activity: Combine a healthy diet with regular physical activity for overall well-being. Aim for at least 150 minutes of moderate-intensity aerobic exercise per week, along with strength training. Moderation and  Balance: Enjoy treats and indulgent foods in moderation, emphasizing balance rather than strict restriction.  Handouts Previously Provided Include  Detailed MyPlate Mindful Meals  Learning Style & Readiness for Change Teaching method utilized: Visual & Auditory  Demonstrated degree of understanding via: Teach Back  Barriers to learning/adherence to lifestyle change: emotional eating   MONITORING & EVALUATION Dietary intake, weekly physical activity  Next Steps  Patient is to return in 1-2 months

## 2024-01-17 ENCOUNTER — Ambulatory Visit: Payer: Self-pay | Admitting: Family Medicine

## 2024-01-17 ENCOUNTER — Other Ambulatory Visit: Payer: Self-pay

## 2024-01-17 ENCOUNTER — Ambulatory Visit: Admitting: Family Medicine

## 2024-01-17 VITALS — BP 113/77 | HR 75 | Ht 67.0 in | Wt 246.2 lb

## 2024-01-17 DIAGNOSIS — Z6838 Body mass index (BMI) 38.0-38.9, adult: Secondary | ICD-10-CM

## 2024-01-17 DIAGNOSIS — I10 Essential (primary) hypertension: Secondary | ICD-10-CM

## 2024-01-17 DIAGNOSIS — E66812 Obesity, class 2: Secondary | ICD-10-CM | POA: Diagnosis not present

## 2024-01-17 DIAGNOSIS — M5431 Sciatica, right side: Secondary | ICD-10-CM

## 2024-01-17 DIAGNOSIS — Z794 Long term (current) use of insulin: Secondary | ICD-10-CM | POA: Diagnosis not present

## 2024-01-17 DIAGNOSIS — E1169 Type 2 diabetes mellitus with other specified complication: Secondary | ICD-10-CM | POA: Diagnosis not present

## 2024-01-17 LAB — POCT GLYCOSYLATED HEMOGLOBIN (HGB A1C): HbA1c, POC (controlled diabetic range): 7.9 % — AB (ref 0.0–7.0)

## 2024-01-17 MED ORDER — DICLOFENAC SODIUM 1 % EX GEL
4.0000 g | Freq: Four times a day (QID) | CUTANEOUS | 0 refills | Status: AC
  Filled 2024-01-17: qty 100, 7d supply, fill #0

## 2024-01-18 NOTE — Progress Notes (Signed)
 "  Established Patient Office Visit  Subjective    Patient ID: Stacy Wang, female    DOB: 1995-05-16  Age: 29 y.o. MRN: [id number]  CC:  Chief Complaint  Patient presents with   Medical Management of Chronic Issues    Pt reports lower back pain - is concerned it is her sciatica nerve     HPI Stacy Wang presents for routine follow up of diabetes and hypertension. She reports med compliance. She also reports some right sided back pain that goes down her leg. She denies known trauma or injury. Symptoms have been for several days.   Outpatient Encounter Medications as of 01/17/2024  Medication Sig   Accu-Chek Softclix Lancets lancets Use to check blood sugar 3 times daily.   albuterol  (VENTOLIN  HFA) 108 (90 Base) MCG/ACT inhaler Inhale 1-2 puffs into the lungs every 6 (six) hours as needed for wheezing or shortness of breath.   amLODipine  (NORVASC ) 10 MG tablet Take 1 tablet (10 mg total) by mouth daily.   atorvastatin  (LIPITOR) 40 MG tablet Take 1 tablet (40 mg total) by mouth daily.   Blood Glucose Monitoring Suppl (ACCU-CHEK GUIDE) w/Device KIT Use to check blood sugar 3 times daily.   Continuous Glucose Receiver (FREESTYLE LIBRE 3 READER) DEVI Change sensor every 15 days. Use to check blood sugar continuously.   Continuous Glucose Sensor (FREESTYLE LIBRE 3 PLUS SENSOR) MISC Change sensor every 15 days. Use to check blood sugar continuously.   diclofenac  Sodium (VOLTAREN ) 1 % GEL Apply 4 g topically 4 (four) times daily.   Dulaglutide  (TRULICITY ) 3 MG/0.5ML SOAJ Inject 3 mg into the skin once a week.   FLUoxetine  (PROZAC ) 10 MG tablet Take 1 tablet (10 mg total) by mouth daily.   glucose blood (ACCU-CHEK GUIDE TEST) test strip Use to check blood sugar 3 times daily.   hydrOXYzine  (VISTARIL ) 25 MG capsule Take 1 capsule (25 mg total) by mouth every 8 (eight) hours as needed.   insulin  glargine (LANTUS  SOLOSTAR) 100 UNIT/ML Solostar Pen Inject 10 Units into the skin daily.    metoprolol  succinate (TOPROL -XL) 50 MG 24 hr tablet Take 1 tablet (50 mg total) by mouth daily. Take with or immediately following a meal.   norgestimate -ethinyl estradiol  (MILI ) 0.25-35 MG-MCG tablet Take 1 tablet by mouth daily.   No facility-administered encounter medications on file as of 01/17/2024.    Past Medical History:  Diagnosis Date   Bell palsy    Bell's palsy    Diabetes mellitus without complication (HCC)    type 2   Hypertension    Migraine     Past Surgical History:  Procedure Laterality Date   WISDOM TOOTH EXTRACTION      Family History  Problem Relation Age of Onset   Depression Mother    Diabetes Father    Heart disease Father     Social History   Socioeconomic History   Marital status: Single    Spouse name: Not on file   Number of children: 0   Years of education: Some colle   Highest education level: Some college, no degree  Occupational History   Occupation: Personal Assistant - customer service  Tobacco Use   Smoking status: Some Days    Current packs/day: 0.00    Average packs/day: 0.3 packs/day for 2.0 years (0.5 ttl pk-yrs)    Types: E-cigarettes, Cigarettes    Start date: 05/16/2015    Last attempt to quit: 05/15/2017    Years since quitting: 6.6   Smokeless  tobacco: Never  Vaping Use   Vaping status: Every Day   Substances: Nicotine , Flavoring  Substance and Sexual Activity   Alcohol use: Not Currently    Alcohol/week: 0.0 standard drinks of alcohol    Comment: 1/2 bottle liquor in one day for the week   Drug use: No   Sexual activity: Not Currently  Other Topics Concern   Not on file  Social History Narrative   Right handed.   Lives at home with roommates.   Caffeine occasionally - not daily.   Social Drivers of Health   Tobacco Use: High Risk (01/10/2024)   Patient History    Smoking Tobacco Use: Some Days    Smokeless Tobacco Use: Never    Passive Exposure: Not on file  Financial Resource Strain: Medium Risk (01/17/2024)   Overall  Financial Resource Strain (CARDIA)    Difficulty of Paying Living Expenses: Somewhat hard  Food Insecurity: No Food Insecurity (01/17/2024)   Epic    Worried About Programme Researcher, Broadcasting/film/video in the Last Year: Never true    Ran Out of Food in the Last Year: Never true  Transportation Needs: No Transportation Needs (01/17/2024)   Epic    Lack of Transportation (Medical): No    Lack of Transportation (Non-Medical): No  Physical Activity: Insufficiently Active (01/17/2024)   Exercise Vital Sign    Days of Exercise per Week: 2 days    Minutes of Exercise per Session: 60 min  Stress: No Stress Concern Present (01/17/2024)   [employer] of Occupational Health - Occupational Stress Questionnaire    Feeling of Stress: Only a little  Social Connections: Socially Isolated (01/17/2024)   Social Connection and Isolation Panel    Frequency of Communication with Friends and Family: Three times a week    Frequency of Social Gatherings with Friends and Family: More than three times a week    Attends Religious Services: Never    Database Administrator or Organizations: No    Attends Engineer, Structural: Not on file    Marital Status: Never married  Intimate Partner Violence: Not At Risk (09/28/2022)   Humiliation, Afraid, Rape, and Kick questionnaire    Fear of Current or Ex-Partner: No    Emotionally Abused: No    Physically Abused: No    Sexually Abused: No  Depression (PHQ2-9): Low Risk (07/12/2023)   Depression (PHQ2-9)    PHQ-2 Score: 3  Recent Concern: Depression (PHQ2-9) - High Risk (06/28/2023)   Depression (PHQ2-9)    PHQ-2 Score: 14  Alcohol Screen: Low Risk (01/17/2024)   Alcohol Screen    Last Alcohol Screening Score (AUDIT): 3  Housing: Low Risk (01/17/2024)   Epic    Unable to Pay for Housing in the Last Year: No    Number of Times Moved in the Last Year: 0    Homeless in the Last Year: No  Utilities: At Risk (09/28/2022)   [employer] Utilities    Threatened with loss of utilities: Yes   Health Literacy: Adequate Health Literacy (09/28/2022)   B1300 Health Literacy    Frequency of need for help with medical instructions: Never    Review of Systems  Musculoskeletal:  Positive for back pain.  All other systems reviewed and are negative.       Objective    BP 113/77   Pulse 75   Ht 5' 7 (1.702 m)   Wt 246 lb 3.2 oz (111.7 kg)   LMP 12/31/2023 (Approximate)   SpO2  97%   BMI 38.56 kg/m   Physical Exam Vitals and nursing note reviewed.  Constitutional:      General: She is not in acute distress.    Appearance: She is obese.  Cardiovascular:     Rate and Rhythm: Normal rate and regular rhythm.  Pulmonary:     Effort: Pulmonary effort is normal.     Breath sounds: Normal breath sounds.  Neurological:     General: No focal deficit present.     Mental Status: She is alert and oriented to person, place, and time.         Assessment & Plan:  1. Type 2 diabetes mellitus with other specified complication, with long-term current use of insulin  (HCC) (Primary) Much improved A1c and nearing goal. Continue  - POCT glycosylated hemoglobin (Hb A1C)  2. Primary hypertension Appears stable. Continue  3. Sciatica of right side Patient defers to conservative measures including exercises, nsaids/tylenol /topicals prn  4. Class 2 severe obesity due to excess calories with serious comorbidity and body mass index (BMI) of 38.0 to 38.9 in adult     Return in about 3 months (around 04/16/2024).   Tanda Raguel SQUIBB, MD   "

## 2024-01-19 ENCOUNTER — Encounter: Payer: Self-pay | Admitting: Family Medicine

## 2024-01-23 ENCOUNTER — Encounter: Payer: Self-pay | Admitting: Pharmacist

## 2024-01-23 ENCOUNTER — Ambulatory Visit: Attending: Family Medicine | Admitting: Pharmacist

## 2024-01-23 DIAGNOSIS — Z7985 Long-term (current) use of injectable non-insulin antidiabetic drugs: Secondary | ICD-10-CM | POA: Diagnosis not present

## 2024-01-23 DIAGNOSIS — Z794 Long term (current) use of insulin: Secondary | ICD-10-CM | POA: Diagnosis not present

## 2024-01-23 DIAGNOSIS — E119 Type 2 diabetes mellitus without complications: Secondary | ICD-10-CM

## 2024-01-23 NOTE — Progress Notes (Signed)
 " S:     No chief complaint on file.  29 y.o. female who presents for diabetes evaluation, education, and management. PMH is significant for T2DM with hx of DKA, HTN, PCOS, MDD, obesity.   Patient was referred and last seen by Primary Care Provider, Dr. Tanda, on 01/17/24. At that visit, patient's A1c was 7.9%, down from 13.7% earlier this year!  Her last pharmacy visit was on 11/14/23. At that visit, we started her on Lantus  10 units and Trulcity was continued at 3 mg weekly.   Today, Ms. Acero arrives in good spirits and presents without any assistance. She has been filling Lantus  and taking but finds that she only needs to take this occasionally. She endorses a couple of episodes of hypoglycemia that have occurred since her last visit. She only takes the Lantus  if her fasting sugar is > 180 mg/dL. She is taking Trulicity  every week. Denies any NV, abdominal pain. No changes in vision.  Family/Social History:  -Fhx: depression, DM, heart disease -Tobacco: does not smoke cigarettes  -Alcohol: none since hospitalization  Current diabetes medications include:  Trulicity  3 mg once weekly Lantus  10 units once daily Current hypertension medications include:  Amlodipine  10 mg daily Metoprolol  succinate 50 mg daily  Current hyperlipidemia medications include:  Atorvastatin  40 mg daily   Patient reports adherence to taking all medications.  Insurance coverage: [employer] Managed  Patient denies hypoglycemic events.  Patient denies nocturia (nighttime urination).  Patient denies neuropathy (nerve pain). Patient denies visual changes. Patient reports self foot exams.   Patient reported dietary habits: Eats 3 meals/day Breakfast: fruit (berries), protein shake, keto cereal w/ 2% milk, oatmeal on other days Lunch: varies - will sometimes not get a lunch. Salad w/ chips. Sandwhich. Dinner: largest meal (rice and/or potatoes), salad, protein  Drinks: water, Simply (Lite)  Lemonade  Patient-reported exercise habits: walking  O:     Lab Results  Component Value Date   HGBA1C 7.9 (A) 01/17/2024   There were no vitals filed for this visit.   Lipid Panel     Component Value Date/Time   CHOL 124 06/21/2023 0936   TRIG 89 06/21/2023 0936   HDL 45 06/21/2023 0936   CHOLHDL 2.8 06/21/2023 0936   CHOLHDL 5.3 05/21/2017 0648   VLDL 22 05/21/2017 0648   LDLCALC 62 06/21/2023 0936    Clinical Atherosclerotic Cardiovascular Disease (ASCVD): No  The ASCVD Risk score (Arnett DK, et al., 2019) failed to calculate for the following reasons:   The 2019 ASCVD risk score is only valid for ages 40 to 61   Patient is participating in a Managed Medicaid Plan:  Yes, [employer] Managed  A/P: Diabetes longstanding currently above goal but drastically improved with A1c of 7.9% last week. She is tolerating Trulicity  well but only takes Lantus  when fasting CBGs at home are > 180 mg/dL. Patient is able to verbalize appropriate hypoglycemia management plan.  -Continue Lantus  10 units daily as needed when fasting sugars are above goal.  -Continue Trulicity  3 mg weekly.  -Patient educated on purpose, proper use, and potential adverse effects of Trulicity  and Lantus .  -Extensively discussed pathophysiology of diabetes, recommended lifestyle interventions, dietary effects on blood sugar control.  -Counseled on s/sx of and management of hypoglycemia.  -Next A1c anticipated 04/2024.   Written patient instructions provided. Patient verbalized understanding of treatment plan.  Total time in face to face counseling 30 minutes.    Follow-up:  Pharmacist prn PCP clinic visit in April.  Herlene Salinas  Tonia, PharmD, BCACP, CPP Clinical Pharmacist [facility] [place] [phone]  "

## 2024-02-10 ENCOUNTER — Other Ambulatory Visit: Payer: Self-pay | Admitting: Family Medicine

## 2024-02-10 ENCOUNTER — Other Ambulatory Visit: Payer: Self-pay

## 2024-02-10 ENCOUNTER — Other Ambulatory Visit (HOSPITAL_COMMUNITY): Payer: Self-pay

## 2024-02-10 MED ORDER — AMLODIPINE BESYLATE 10 MG PO TABS
10.0000 mg | ORAL_TABLET | Freq: Every day | ORAL | 0 refills | Status: AC
  Filled 2024-02-10: qty 30, 30d supply, fill #0

## 2024-02-13 ENCOUNTER — Other Ambulatory Visit: Payer: Self-pay

## 2024-02-15 ENCOUNTER — Other Ambulatory Visit: Payer: Self-pay

## 2024-02-15 MED ORDER — ATORVASTATIN CALCIUM 40 MG PO TABS
40.0000 mg | ORAL_TABLET | Freq: Every day | ORAL | 0 refills | Status: AC
  Filled 2024-02-15: qty 30, 30d supply, fill #0

## 2024-02-16 ENCOUNTER — Other Ambulatory Visit: Payer: Self-pay

## 2024-02-17 ENCOUNTER — Other Ambulatory Visit: Payer: Self-pay

## 2024-04-09 ENCOUNTER — Encounter: Admitting: Skilled Nursing Facility1

## 2024-04-17 ENCOUNTER — Ambulatory Visit: Payer: Self-pay | Admitting: Family Medicine

## 2024-05-07 ENCOUNTER — Ambulatory Visit: Payer: Self-pay | Admitting: Pharmacist
